# Patient Record
Sex: Male | Born: 1935 | Race: White | Hispanic: No | Marital: Married | Smoking: Former smoker
Health system: Southern US, Community
[De-identification: ages and names within clinical notes are randomized; demographics above are authoritative.]

## PROBLEM LIST (undated history)

## (undated) DIAGNOSIS — D649 Anemia, unspecified: Secondary | ICD-10-CM

## (undated) DIAGNOSIS — I255 Ischemic cardiomyopathy: Secondary | ICD-10-CM

## (undated) DIAGNOSIS — E119 Type 2 diabetes mellitus without complications: Secondary | ICD-10-CM

## (undated) DIAGNOSIS — M1990 Unspecified osteoarthritis, unspecified site: Secondary | ICD-10-CM

## (undated) DIAGNOSIS — Z8701 Personal history of pneumonia (recurrent): Secondary | ICD-10-CM

## (undated) DIAGNOSIS — I639 Cerebral infarction, unspecified: Secondary | ICD-10-CM

## (undated) DIAGNOSIS — R001 Bradycardia, unspecified: Secondary | ICD-10-CM

## (undated) DIAGNOSIS — Z950 Presence of cardiac pacemaker: Secondary | ICD-10-CM

## (undated) DIAGNOSIS — K922 Gastrointestinal hemorrhage, unspecified: Secondary | ICD-10-CM

## (undated) DIAGNOSIS — I482 Chronic atrial fibrillation, unspecified: Secondary | ICD-10-CM

## (undated) DIAGNOSIS — E782 Mixed hyperlipidemia: Secondary | ICD-10-CM

## (undated) DIAGNOSIS — Z9289 Personal history of other medical treatment: Secondary | ICD-10-CM

## (undated) DIAGNOSIS — N183 Chronic kidney disease, stage 3 unspecified: Secondary | ICD-10-CM

## (undated) DIAGNOSIS — I5022 Chronic systolic (congestive) heart failure: Secondary | ICD-10-CM

## (undated) DIAGNOSIS — I4510 Unspecified right bundle-branch block: Secondary | ICD-10-CM

## (undated) DIAGNOSIS — I10 Essential (primary) hypertension: Secondary | ICD-10-CM

## (undated) DIAGNOSIS — I2510 Atherosclerotic heart disease of native coronary artery without angina pectoris: Secondary | ICD-10-CM

## (undated) HISTORY — DX: Chronic kidney disease, stage 3 (moderate): N18.3

## (undated) HISTORY — DX: Chronic atrial fibrillation, unspecified: I48.20

## (undated) HISTORY — DX: Mixed hyperlipidemia: E78.2

## (undated) HISTORY — DX: Essential (primary) hypertension: I10

## (undated) HISTORY — DX: Cerebral infarction, unspecified: I63.9

## (undated) HISTORY — DX: Chronic kidney disease, stage 3 unspecified: N18.30

## (undated) HISTORY — DX: Chronic systolic (congestive) heart failure: I50.22

## (undated) HISTORY — DX: Bradycardia, unspecified: R00.1

## (undated) HISTORY — DX: Gastrointestinal hemorrhage, unspecified: K92.2

## (undated) HISTORY — DX: Atherosclerotic heart disease of native coronary artery without angina pectoris: I25.10

## (undated) HISTORY — DX: Unspecified right bundle-branch block: I45.10

---
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
## 1969-04-15 DIAGNOSIS — Z8701 Personal history of pneumonia (recurrent): Secondary | ICD-10-CM

## 1969-04-15 HISTORY — DX: Personal history of pneumonia (recurrent): Z87.01

## 1999-04-16 HISTORY — PX: LUMBAR DISC SURGERY: SHX700

## 2005-03-03 ENCOUNTER — Encounter: Admission: RE | Admit: 2005-03-03 | Discharge: 2005-04-21 | Payer: Self-pay | Admitting: Family Medicine

## 2005-05-24 ENCOUNTER — Ambulatory Visit: Payer: Self-pay | Admitting: Cardiology

## 2005-05-26 ENCOUNTER — Ambulatory Visit: Payer: Self-pay | Admitting: Cardiology

## 2005-05-30 ENCOUNTER — Inpatient Hospital Stay (HOSPITAL_COMMUNITY): Admission: RE | Admit: 2005-05-30 | Discharge: 2005-05-31 | Payer: Self-pay | Admitting: Neurosurgery

## 2005-06-22 ENCOUNTER — Ambulatory Visit: Payer: Self-pay | Admitting: Cardiology

## 2005-07-26 ENCOUNTER — Ambulatory Visit: Admission: RE | Admit: 2005-07-26 | Discharge: 2005-07-26 | Payer: Self-pay | Admitting: Neurosurgery

## 2005-11-29 ENCOUNTER — Ambulatory Visit: Payer: Self-pay | Admitting: Cardiology

## 2005-12-02 ENCOUNTER — Ambulatory Visit: Payer: Self-pay | Admitting: Cardiology

## 2005-12-05 ENCOUNTER — Ambulatory Visit: Payer: Self-pay | Admitting: Cardiology

## 2005-12-09 ENCOUNTER — Ambulatory Visit: Payer: Self-pay | Admitting: Cardiology

## 2005-12-16 ENCOUNTER — Ambulatory Visit: Payer: Self-pay | Admitting: Cardiology

## 2005-12-27 ENCOUNTER — Ambulatory Visit: Payer: Self-pay | Admitting: Cardiology

## 2005-12-30 ENCOUNTER — Ambulatory Visit: Payer: Self-pay | Admitting: Cardiology

## 2006-01-03 ENCOUNTER — Ambulatory Visit: Payer: Self-pay | Admitting: Cardiology

## 2006-01-10 ENCOUNTER — Ambulatory Visit: Payer: Self-pay | Admitting: Cardiology

## 2006-01-11 ENCOUNTER — Ambulatory Visit: Payer: Self-pay | Admitting: Cardiology

## 2006-01-13 ENCOUNTER — Ambulatory Visit: Payer: Self-pay | Admitting: Cardiology

## 2006-01-24 ENCOUNTER — Ambulatory Visit: Payer: Self-pay | Admitting: Cardiology

## 2006-01-27 ENCOUNTER — Inpatient Hospital Stay (HOSPITAL_COMMUNITY): Admission: AD | Admit: 2006-01-27 | Discharge: 2006-02-05 | Payer: Self-pay | Admitting: Cardiology

## 2006-01-27 ENCOUNTER — Ambulatory Visit: Payer: Self-pay | Admitting: Cardiology

## 2006-02-04 ENCOUNTER — Ambulatory Visit: Payer: Self-pay | Admitting: Internal Medicine

## 2006-02-06 ENCOUNTER — Ambulatory Visit: Payer: Self-pay | Admitting: Cardiology

## 2006-02-08 ENCOUNTER — Ambulatory Visit: Payer: Self-pay | Admitting: Cardiology

## 2006-02-16 ENCOUNTER — Ambulatory Visit: Payer: Self-pay | Admitting: Cardiology

## 2006-02-22 ENCOUNTER — Ambulatory Visit: Payer: Self-pay | Admitting: Cardiology

## 2006-03-08 ENCOUNTER — Ambulatory Visit: Payer: Self-pay | Admitting: Cardiology

## 2006-04-05 ENCOUNTER — Ambulatory Visit: Payer: Self-pay | Admitting: Cardiology

## 2006-04-12 ENCOUNTER — Ambulatory Visit: Payer: Self-pay | Admitting: Cardiology

## 2006-04-19 ENCOUNTER — Ambulatory Visit: Payer: Self-pay | Admitting: Cardiology

## 2006-04-26 ENCOUNTER — Ambulatory Visit: Payer: Self-pay | Admitting: Cardiology

## 2006-05-03 ENCOUNTER — Ambulatory Visit: Payer: Self-pay | Admitting: Cardiology

## 2006-05-17 ENCOUNTER — Ambulatory Visit: Payer: Self-pay | Admitting: Cardiology

## 2006-06-21 ENCOUNTER — Ambulatory Visit: Payer: Self-pay | Admitting: Cardiology

## 2006-07-19 ENCOUNTER — Ambulatory Visit: Payer: Self-pay | Admitting: Cardiology

## 2006-08-17 ENCOUNTER — Ambulatory Visit: Payer: Self-pay | Admitting: Cardiology

## 2006-09-19 ENCOUNTER — Ambulatory Visit: Payer: Self-pay | Admitting: Cardiology

## 2006-10-18 ENCOUNTER — Ambulatory Visit: Payer: Self-pay | Admitting: Cardiology

## 2006-11-01 ENCOUNTER — Ambulatory Visit: Payer: Self-pay | Admitting: Cardiology

## 2006-11-30 ENCOUNTER — Ambulatory Visit: Payer: Self-pay | Admitting: Cardiology

## 2006-12-28 ENCOUNTER — Ambulatory Visit: Payer: Self-pay | Admitting: Cardiology

## 2007-01-29 ENCOUNTER — Ambulatory Visit: Payer: Self-pay | Admitting: Physician Assistant

## 2007-02-26 ENCOUNTER — Ambulatory Visit: Payer: Self-pay | Admitting: Physician Assistant

## 2007-03-28 ENCOUNTER — Ambulatory Visit: Payer: Self-pay | Admitting: Cardiology

## 2007-03-29 IMAGING — US US RENAL
1 series · 14 of 21 positions shown · non-contrast
Comparison: none

CLINICAL DATA: Acute renal failure.  Hypertension and diabetes.  
RENAL/URINARY TRACT ULTRASOUND:
TECHNIQUE: Complete ultrasound examination of the urinary tract was performed including evaluation of the kidneys, renal collecting systems, and urinary bladder.

[Series 1: unknown · 0.32mm/px · 14 of 21 slices shown]
[im 1/21]
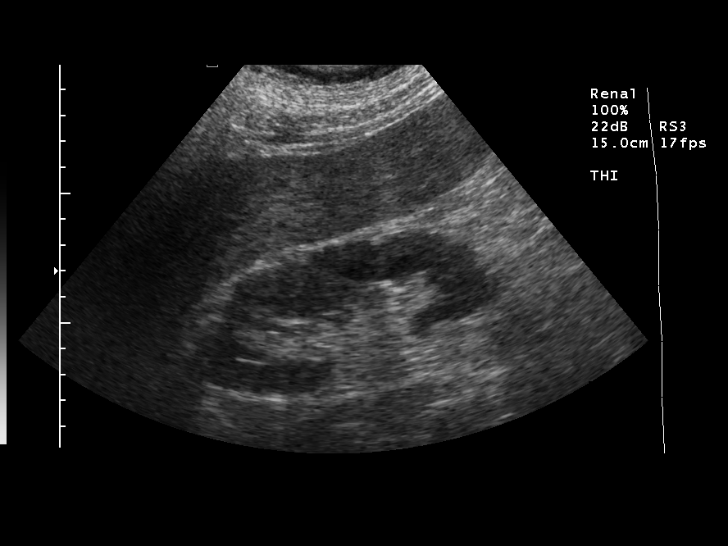
[im 3/21]
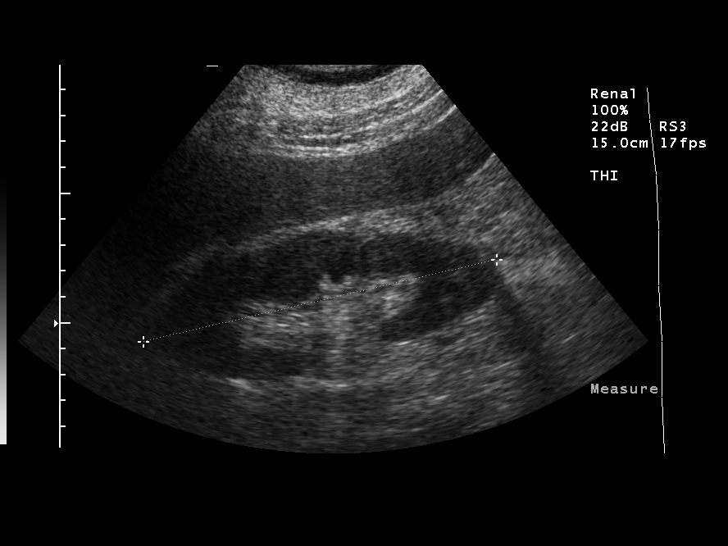
[im 4/21]
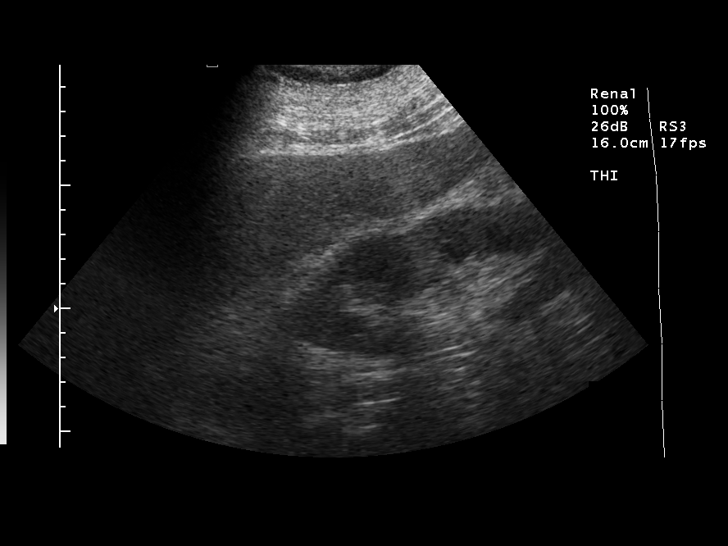
[im 6/21]
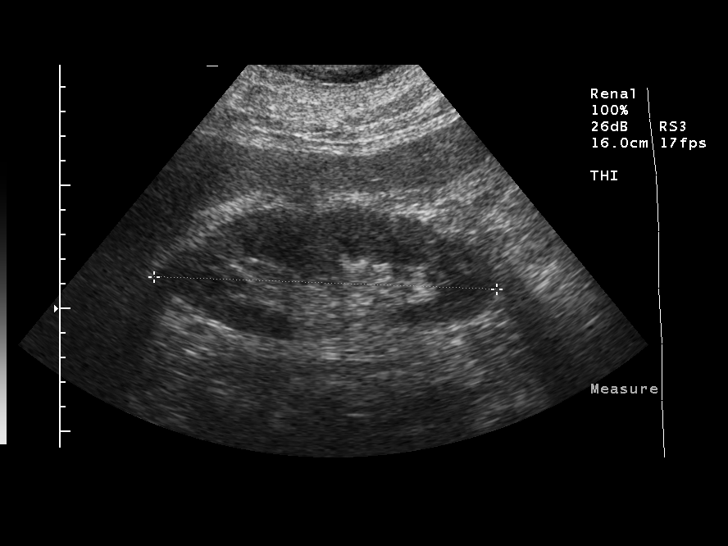
[im 7/21]
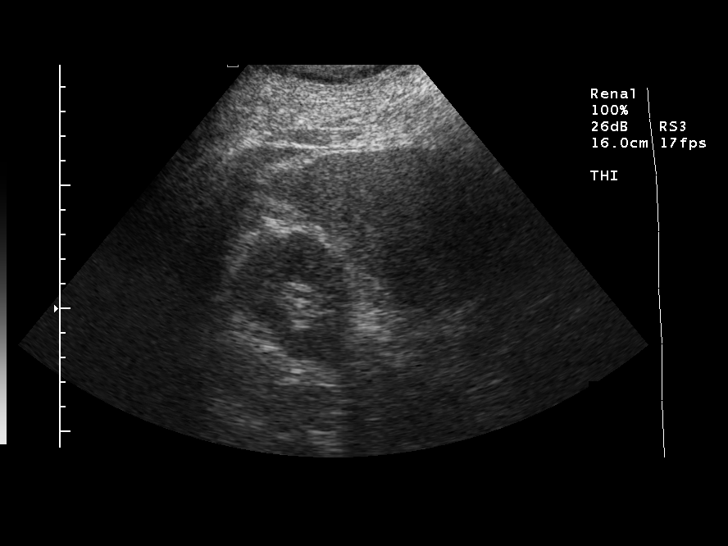
[im 9/21]
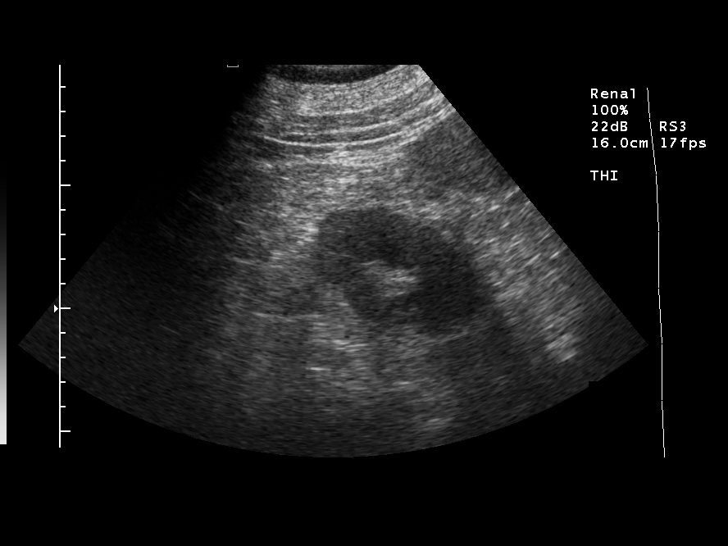
[im 10/21]
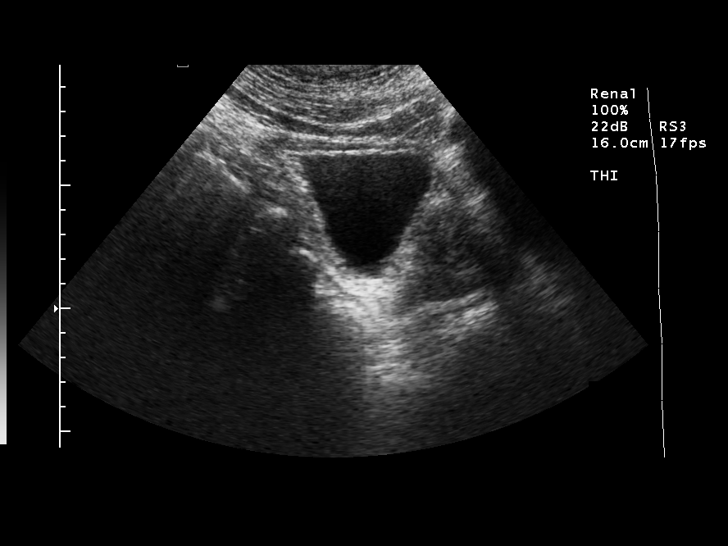
[im 12/21]
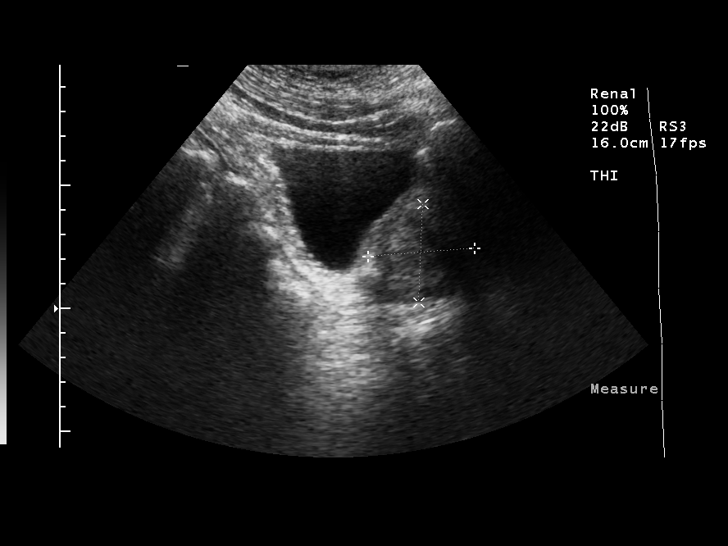
[im 13/21]
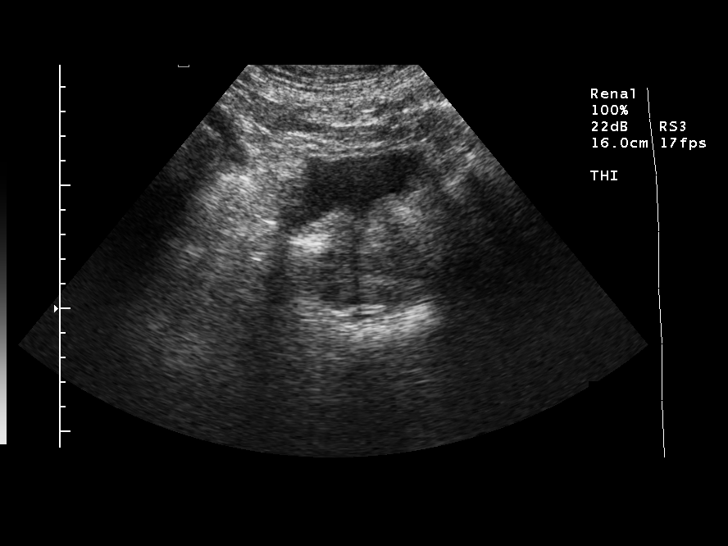
[im 15/21]
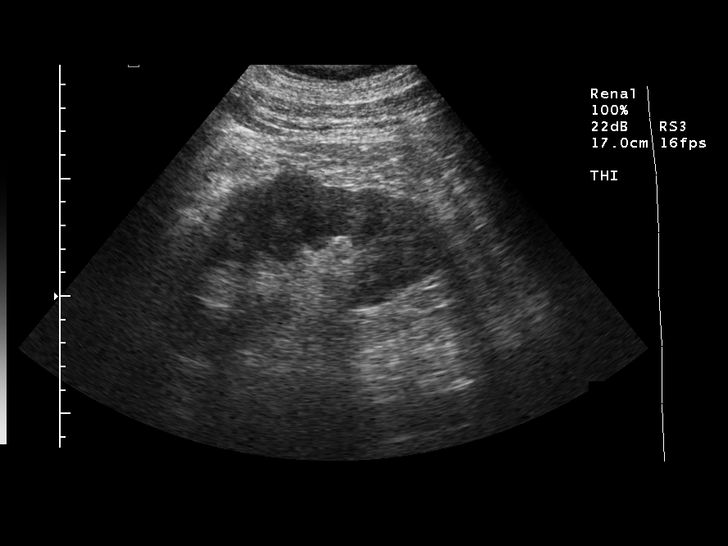
[im 16/21]
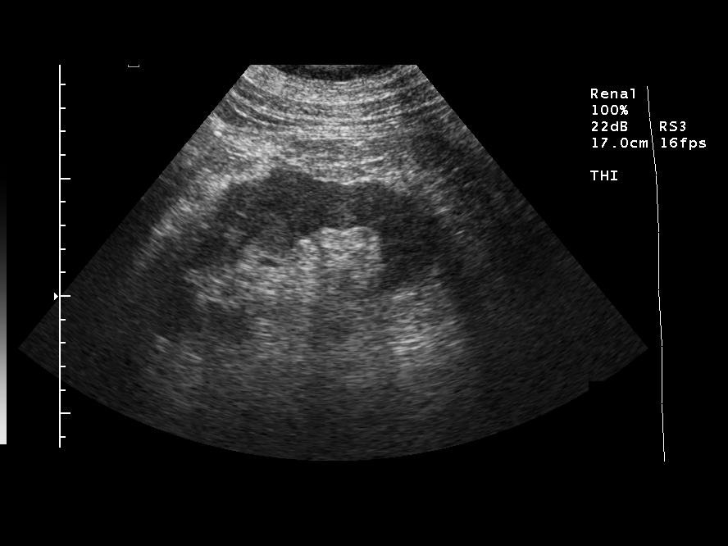
[im 18/21]
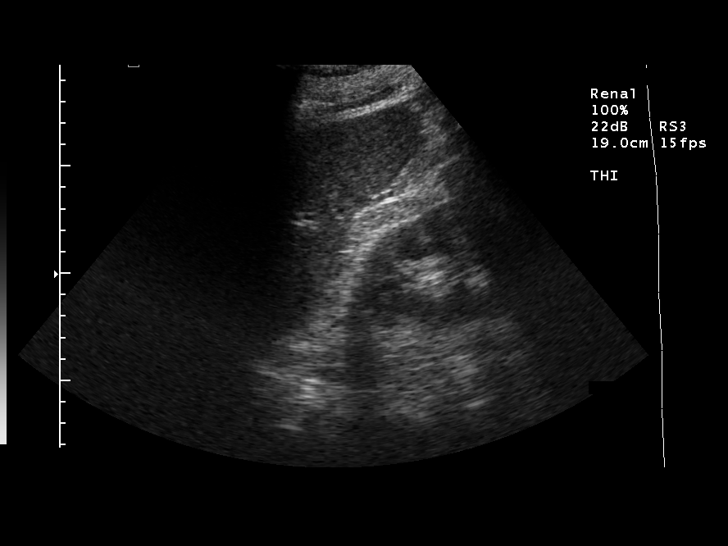
[im 19/21]
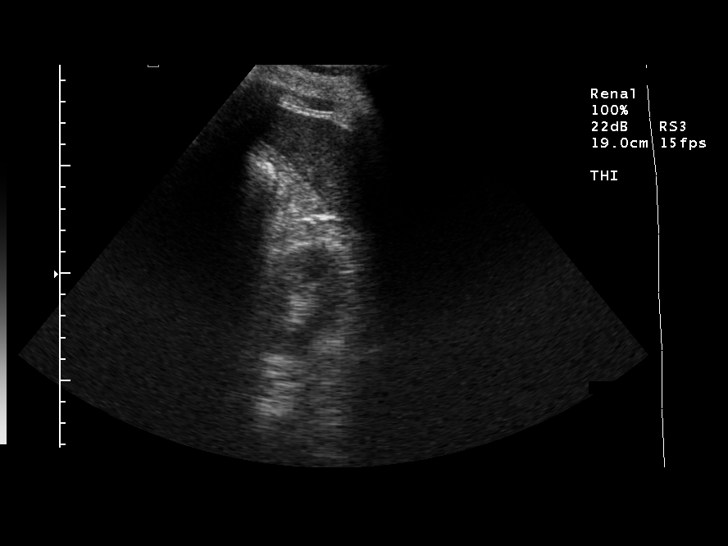
[im 21/21]
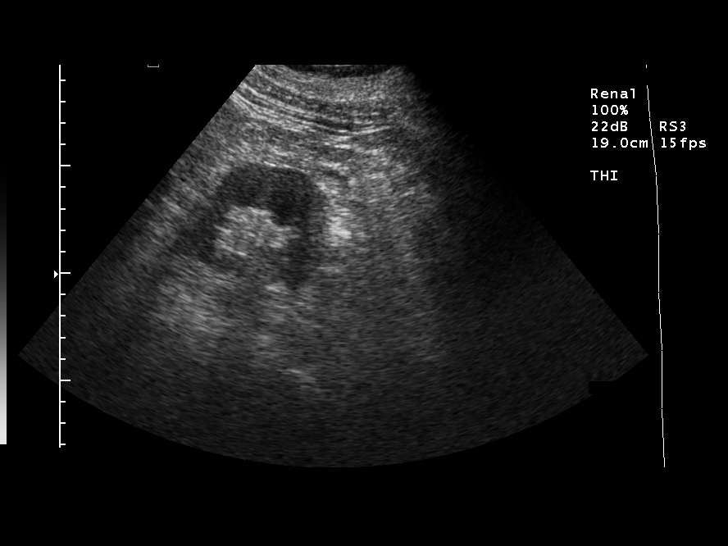

[14 of 21 positions shown; findings below may reference images not displayed]

FINDINGS: The right and left kidneys measure 14.0 cm and 13.3 cm in length, respectively.  There is no hydronephrosis.  No renal mass or abnormality of parenchymal echogenicity/echotexture.  Prominent prostate gland measuring 4.0 x 4.3 x 6.1 cm.
IMPRESSION: No hydronephrosis. 
Enlarged prostate gland.

## 2007-03-29 IMAGING — CR DG CHEST 2V
2 series · 2 of 2 positions shown · non-contrast
Comparison: 01/27/06.

CLINICAL DATA: Atrial fibrillation, evaluate for pulmonary edema. 
 CHEST - 2 VIEW:

[w chest pa]
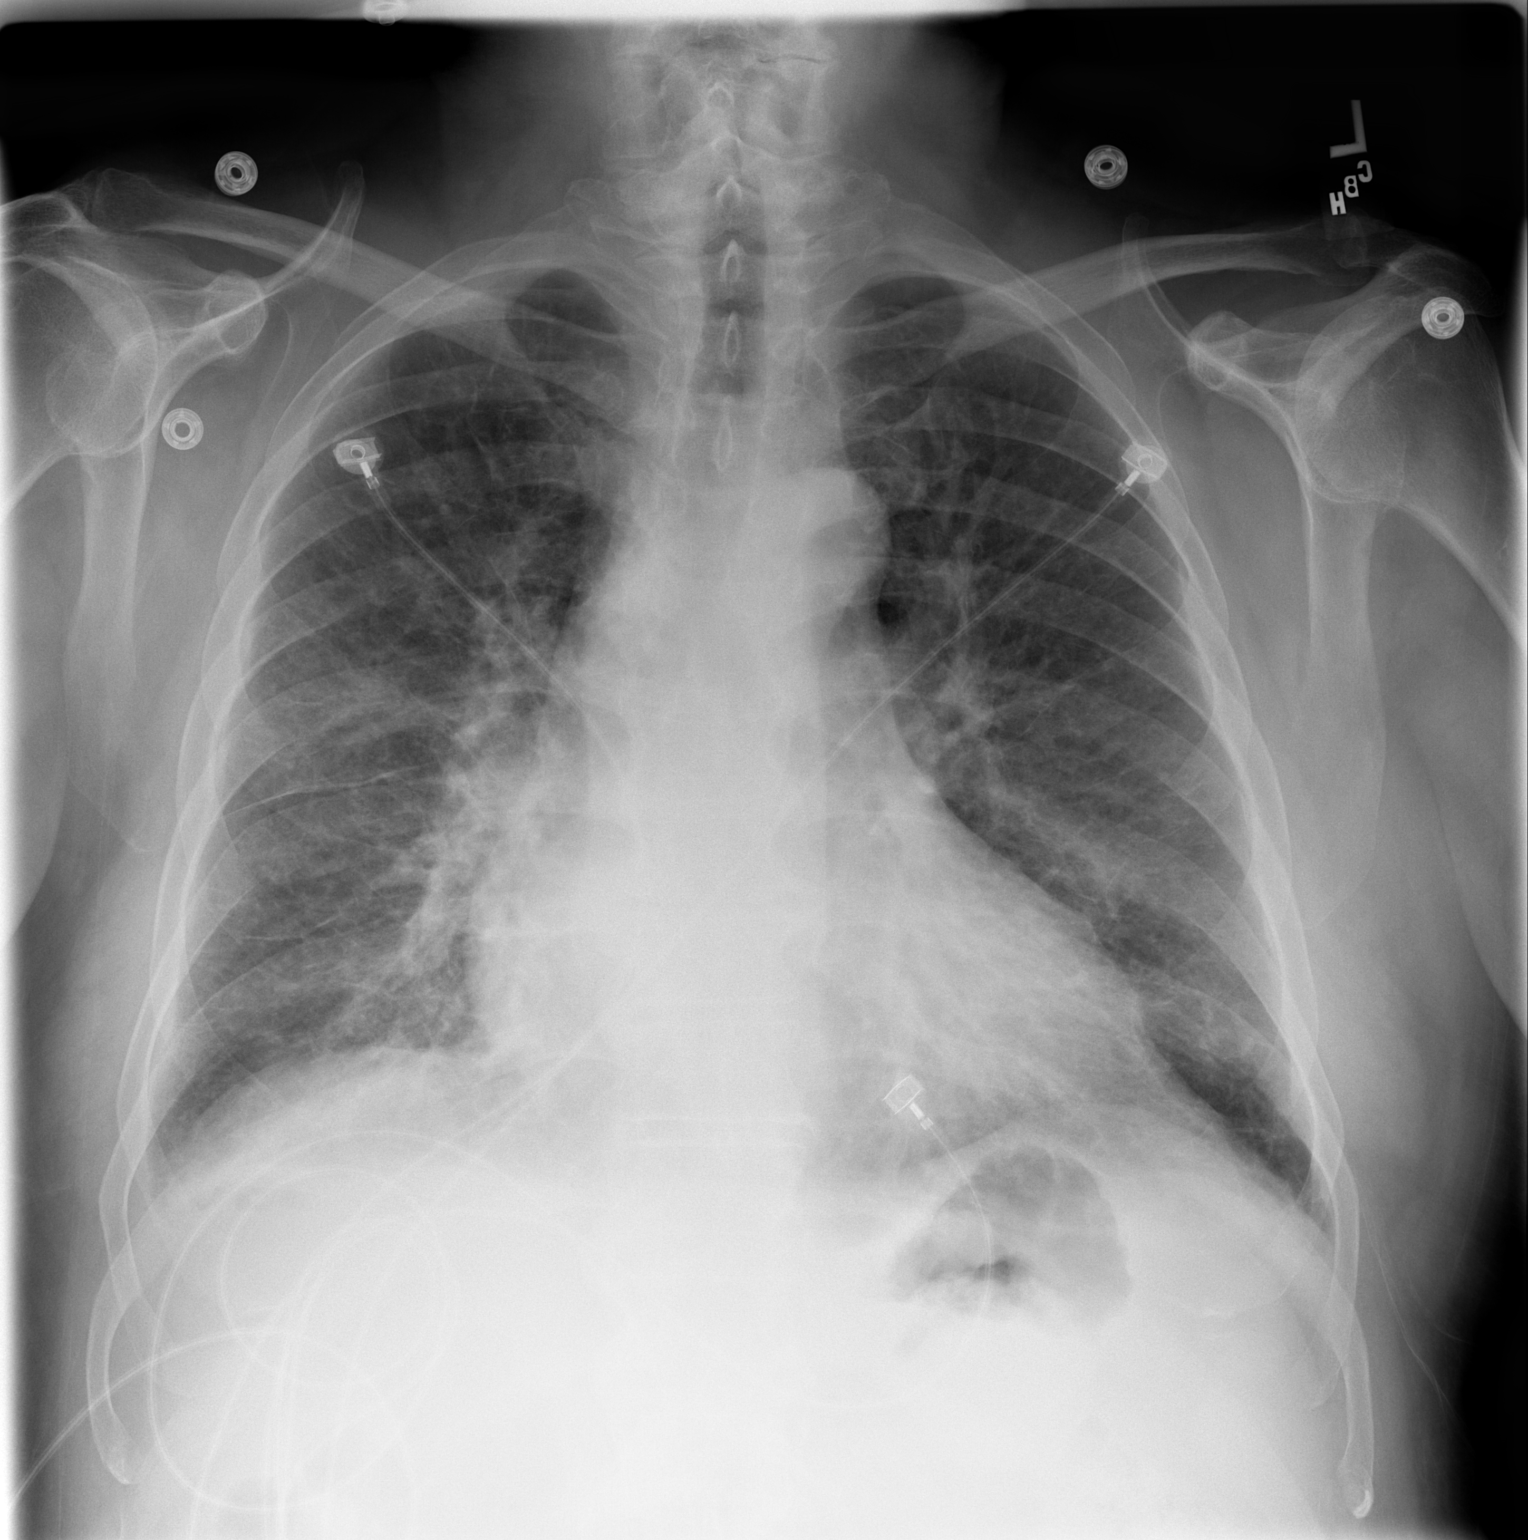

[w chest lat]
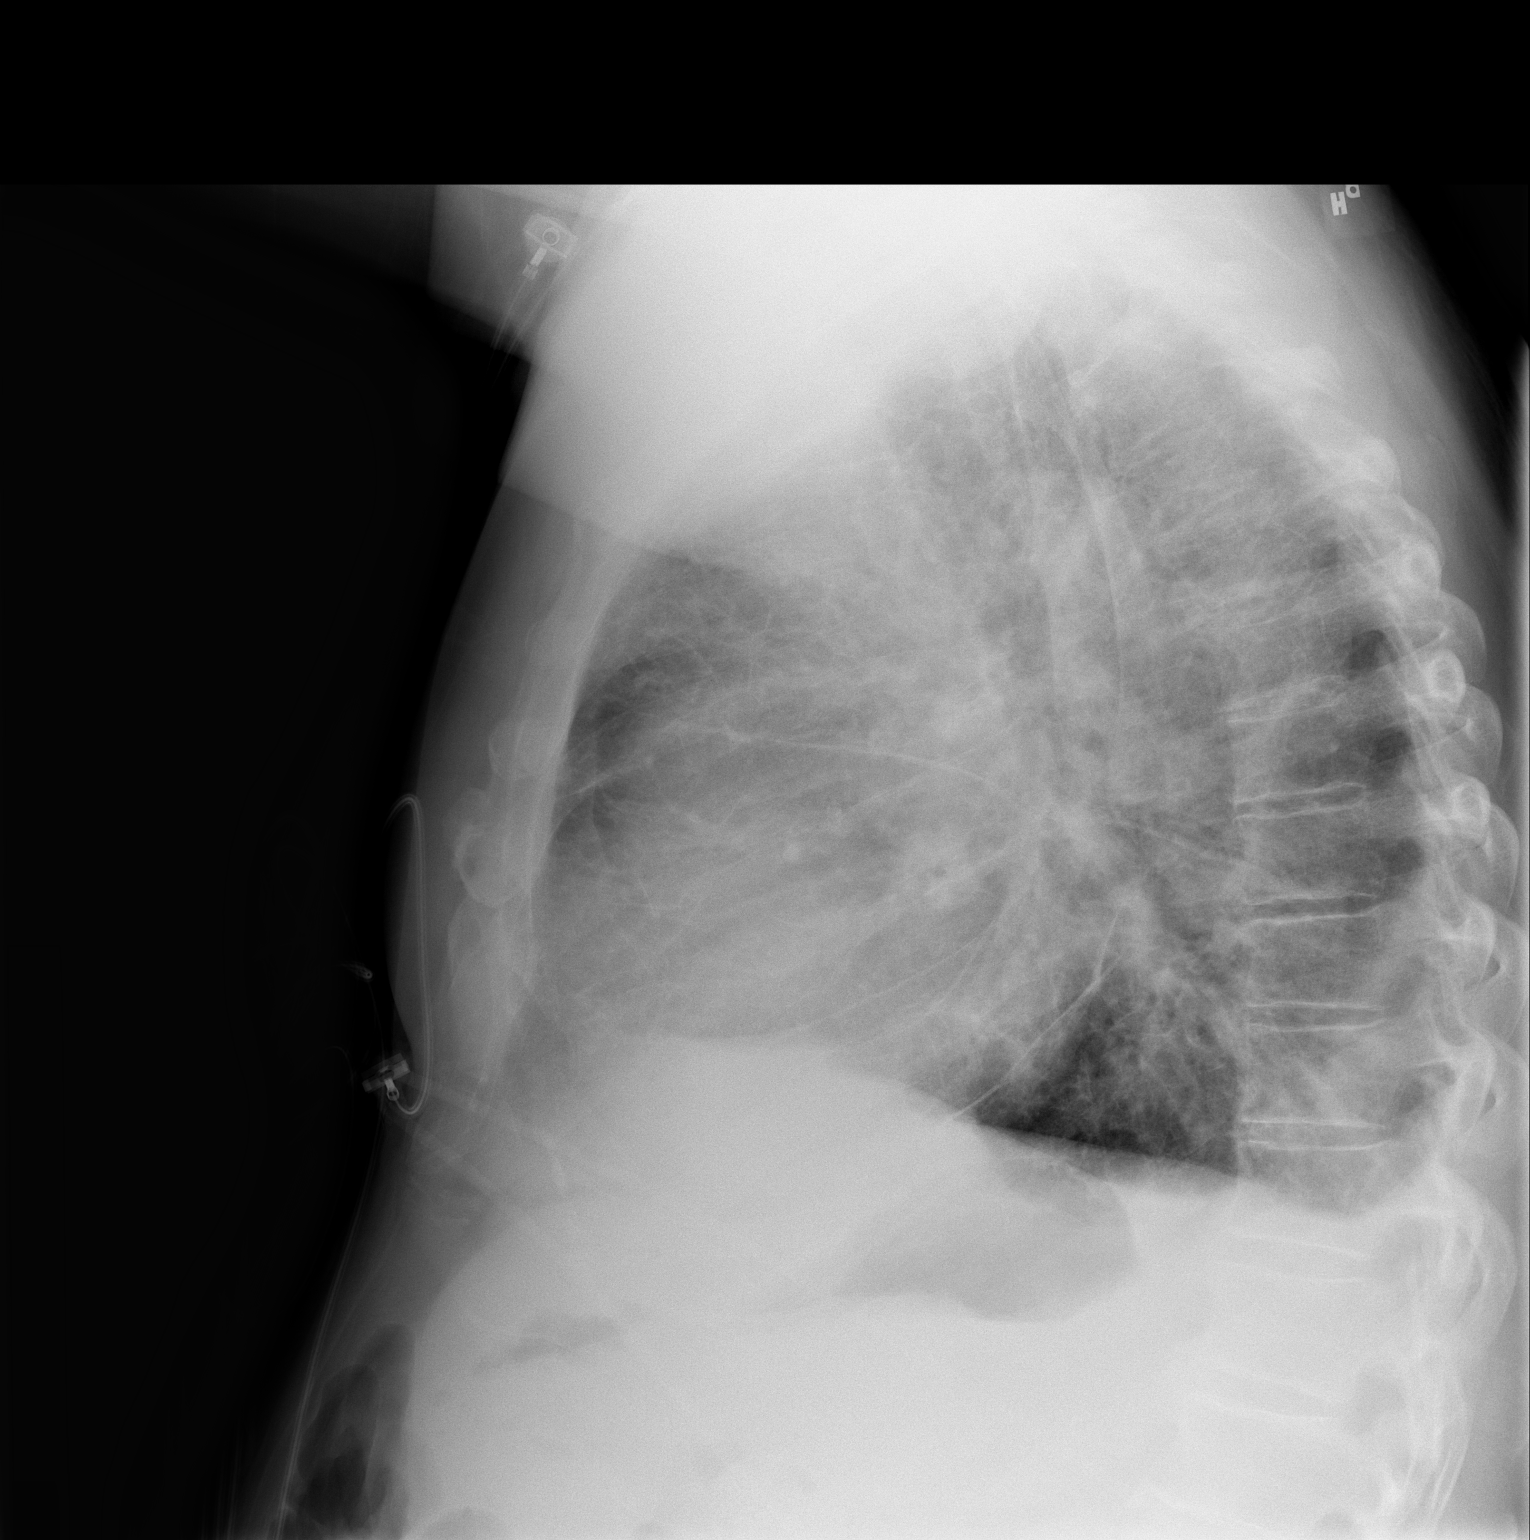

[2 of 2 positions shown; findings below may reference images not displayed]

FINDINGS: There is cardiomegaly.  Bilateral pleural effusions and moderate interstitial edema are noted consistent with fluid overload.  There are patchy opacities within the right mid lung in the left base which may represent atelectasis and/or infection.
IMPRESSION: 1.  Moderate fluid overload with effusions and pulmonary edema.  
 2.  Bilateral patchy opacities consistent with atelectasis and/or infection.

## 2007-04-30 ENCOUNTER — Ambulatory Visit: Payer: Self-pay | Admitting: Cardiology

## 2007-05-14 ENCOUNTER — Encounter: Payer: Self-pay | Admitting: Physician Assistant

## 2007-05-14 ENCOUNTER — Ambulatory Visit: Payer: Self-pay | Admitting: Cardiology

## 2007-05-28 ENCOUNTER — Ambulatory Visit: Payer: Self-pay | Admitting: Cardiology

## 2007-06-28 ENCOUNTER — Ambulatory Visit: Payer: Self-pay | Admitting: Cardiology

## 2007-08-02 ENCOUNTER — Ambulatory Visit: Payer: Self-pay | Admitting: Cardiology

## 2007-11-05 ENCOUNTER — Ambulatory Visit: Payer: Self-pay | Admitting: Cardiology

## 2007-12-24 ENCOUNTER — Encounter: Payer: Self-pay | Admitting: Cardiology

## 2009-04-17 DIAGNOSIS — E785 Hyperlipidemia, unspecified: Secondary | ICD-10-CM | POA: Insufficient documentation

## 2009-04-17 DIAGNOSIS — I4510 Unspecified right bundle-branch block: Secondary | ICD-10-CM

## 2009-04-17 DIAGNOSIS — I5042 Chronic combined systolic (congestive) and diastolic (congestive) heart failure: Secondary | ICD-10-CM | POA: Insufficient documentation

## 2009-04-17 DIAGNOSIS — I2510 Atherosclerotic heart disease of native coronary artery without angina pectoris: Secondary | ICD-10-CM

## 2009-04-17 DIAGNOSIS — I482 Chronic atrial fibrillation, unspecified: Secondary | ICD-10-CM

## 2009-04-17 DIAGNOSIS — I10 Essential (primary) hypertension: Secondary | ICD-10-CM

## 2010-03-02 ENCOUNTER — Telehealth (INDEPENDENT_AMBULATORY_CARE_PROVIDER_SITE_OTHER): Payer: Self-pay | Admitting: *Deleted

## 2010-03-03 ENCOUNTER — Ambulatory Visit: Payer: Self-pay | Admitting: Cardiology

## 2010-03-05 ENCOUNTER — Encounter: Payer: Self-pay | Admitting: Physician Assistant

## 2010-03-09 ENCOUNTER — Encounter (INDEPENDENT_AMBULATORY_CARE_PROVIDER_SITE_OTHER): Payer: Self-pay | Admitting: *Deleted

## 2010-03-09 ENCOUNTER — Ambulatory Visit: Payer: Self-pay | Admitting: Cardiology

## 2010-03-09 DIAGNOSIS — N189 Chronic kidney disease, unspecified: Secondary | ICD-10-CM | POA: Insufficient documentation

## 2010-03-09 DIAGNOSIS — I4892 Unspecified atrial flutter: Secondary | ICD-10-CM

## 2010-03-17 ENCOUNTER — Ambulatory Visit: Payer: Self-pay | Admitting: Cardiology

## 2010-03-26 ENCOUNTER — Encounter: Payer: Self-pay | Admitting: Cardiology

## 2010-03-26 DIAGNOSIS — I2510 Atherosclerotic heart disease of native coronary artery without angina pectoris: Secondary | ICD-10-CM | POA: Insufficient documentation

## 2010-03-31 ENCOUNTER — Ambulatory Visit: Payer: Self-pay | Admitting: Cardiology

## 2010-04-05 ENCOUNTER — Telehealth (INDEPENDENT_AMBULATORY_CARE_PROVIDER_SITE_OTHER): Payer: Self-pay | Admitting: *Deleted

## 2010-04-08 ENCOUNTER — Ambulatory Visit: Payer: Self-pay | Admitting: Cardiology

## 2010-04-08 ENCOUNTER — Telehealth (INDEPENDENT_AMBULATORY_CARE_PROVIDER_SITE_OTHER): Payer: Self-pay | Admitting: *Deleted

## 2010-04-13 ENCOUNTER — Encounter: Payer: Self-pay | Admitting: Cardiology

## 2010-04-28 ENCOUNTER — Telehealth (INDEPENDENT_AMBULATORY_CARE_PROVIDER_SITE_OTHER): Payer: Self-pay | Admitting: *Deleted

## 2010-05-17 ENCOUNTER — Ambulatory Visit: Payer: Self-pay | Admitting: Cardiology

## 2010-05-17 DIAGNOSIS — G4730 Sleep apnea, unspecified: Secondary | ICD-10-CM | POA: Insufficient documentation

## 2010-05-20 ENCOUNTER — Encounter: Payer: Self-pay | Admitting: Cardiology

## 2010-05-24 ENCOUNTER — Encounter: Payer: Self-pay | Admitting: Cardiology

## 2010-06-03 ENCOUNTER — Telehealth (INDEPENDENT_AMBULATORY_CARE_PROVIDER_SITE_OTHER): Payer: Self-pay | Admitting: *Deleted

## 2010-06-16 ENCOUNTER — Encounter: Payer: Self-pay | Admitting: Cardiology

## 2010-06-25 ENCOUNTER — Encounter: Payer: Self-pay | Admitting: Cardiology

## 2010-07-06 ENCOUNTER — Encounter: Payer: Self-pay | Admitting: Cardiology

## 2010-07-15 DIAGNOSIS — K922 Gastrointestinal hemorrhage, unspecified: Secondary | ICD-10-CM | POA: Insufficient documentation

## 2010-07-28 ENCOUNTER — Telehealth (INDEPENDENT_AMBULATORY_CARE_PROVIDER_SITE_OTHER): Payer: Self-pay | Admitting: *Deleted

## 2010-07-30 ENCOUNTER — Encounter: Payer: Self-pay | Admitting: Cardiology

## 2010-08-02 ENCOUNTER — Encounter: Payer: Self-pay | Admitting: Cardiology

## 2010-08-09 ENCOUNTER — Encounter: Payer: Self-pay | Admitting: Cardiology

## 2010-08-16 ENCOUNTER — Encounter: Payer: Self-pay | Admitting: Cardiology

## 2010-08-19 ENCOUNTER — Encounter: Payer: Self-pay | Admitting: Cardiology

## 2010-09-09 ENCOUNTER — Ambulatory Visit
Admission: RE | Admit: 2010-09-09 | Discharge: 2010-09-09 | Payer: Self-pay | Source: Home / Self Care | Attending: Cardiology | Admitting: Cardiology

## 2010-09-14 NOTE — Progress Notes (Signed)
Summary: ? decrease Amiodarone dosage   Medications Added PRADAXA 75 MG CAPS (DABIGATRAN ETEXILATE MESYLATE) Take 1 tablet by mouth two times a day       Phone Note Call from Patient Call back at [phone] - cell   Summary of Call: Feeling better off of the Amiodarone, but still feeling a little weak.  Questions decreasing dosage on Pradaxa to 75mg  two times a day.  States you two discussed during last office visit.   Initial call taken by: Hoover Brunette, LPN,  June 03, 2010 4:08 PM  Follow-up for Phone Call        Decrease pradaxa to 75 mg by mouth two times a day and obtain PTT in 5 days to see if still therapeutic effect at lower dose.  Follow-up by: Lewayne Bunting, MD, [facility],  June 04, 2010 6:13 AM  Additional Follow-up for Phone Call Additional follow up Details #1::        Patient notified.   RX sent to [facility].  Will fax order for labs to [facility]. Hoover Brunette, LPN  June 04, 2010 4:43 PM     New/Updated Medications: PRADAXA 75 MG CAPS (DABIGATRAN ETEXILATE MESYLATE) Take 1 tablet by mouth two times a day Prescriptions: PRADAXA 75 MG CAPS (DABIGATRAN ETEXILATE MESYLATE) Take 1 tablet by mouth two times a day  #60 x 6   Entered by:   Hoover Brunette, LPN   Authorized by:   Lewayne Bunting, MD, [facility]   Signed by:   Hoover Brunette, LPN on 66/44/0347   Method used:   Electronically to        [facility]  [address] (retail)       [address]       [place], [place]  [address]       Ph: [phone]       Fax: [phone]   RxID:   [id number]

## 2010-09-14 NOTE — Progress Notes (Signed)
Summary: med question & lab results   Phone Note Call from Patient Call back at Home Phone [phone] Call back at [phone]   Caller: [name] [name] Reason for Call: Talk to Nurse, Lab or Test Results Details for Reason: MEDS Summary of Call: Patient called in reference to his meds.  Has a question whether something needs to be filled or not and only has 7 days left of meds.  Gets his rx by mail.  Also patient is waiting on test results... Initial call taken by: Claudette Laws,  April 28, 2010 10:05 AM  Follow-up for Phone Call        Notified pt of all test results.  States he still is not feeling good.  No energy, feels weakness in legs at knee level.  No dizziness or SOB to mention.  States he did feel a little better after the recent decrease of Amiodarone.  Also, states that PMD did d/c this med when he was last on a while back due to not being able to tolerate.  He also questions whether or not the Pradaxa could be causing a problem.  Follow-up by: Hoover Brunette, LPN,  April 28, 2010 3:28 PM  Additional Follow-up for Phone Call Additional follow up Details #1::        stop amiodarone with follow-up visits in the next couple of weeks   [name]  May 03, 2010 12:36 PM Patient called again about Pradaxa & Aimodarone has three days of meds and is going out of town Thursday would like a answer today Additional Follow-up by: Lewayne Bunting, MD, [facility],  May 02, 2010 3:42 PM    Additional Follow-up for Phone Call Additional follow up Details #2::    Patient notified.   OV scheduled for 10/3 at 8:30.  Patient verbalized understanding.  Hoover Brunette, LPN  May 03, 2010 1:56 PM   Prescriptions: PRADAXA 150 MG CAPS (DABIGATRAN ETEXILATE MESYLATE) Take 1 tablet by mouth two times a day  #60 x 1   Entered by:   Hoover Brunette, LPN   Authorized by:   Lewayne Bunting, MD, [facility]   Signed by:   Hoover Brunette, LPN on 29/56/2130   Method used:   Electronically to        [facility]  [address] (retail)       [address]       [place], [place]  [address]       Ph: [phone]       Fax: [phone]   RxID:   [id number]

## 2010-09-14 NOTE — Miscellaneous (Signed)
Summary: Orders Update - echo  Clinical Lists Changes  Problems: Added new problem of LEFT VENTRICULAR FUNCTION, DECREASED (ICD-429.2) Orders: Added new Referral order of 2-D Echocardiogram (2D Echo) - Signed

## 2010-09-14 NOTE — Letter (Signed)
Summary: External Correspondence/ FAXED [facility] REFERR[facility]  External Correspondence/ FAXED [facility] RE[facility]ERR[facility]   Imported By: [name] 06/01/2010 15:41:39  _____________________________________________________________________  External Attachment:    Type:   Image     Comment:   External Document

## 2010-09-14 NOTE — Progress Notes (Signed)
Summary: HEART RATE 120 PLUS       Phone Note Call from Patient Call back at [phone]   Reason for Call: Talk to Nurse Details for Reason: heart racing Summary of Call: PATIENT'S WIFE CALLED VERY CONCERNED WITH IS HEART RATE 120 PLUS FOR LAST SEVERAL DAYS. SHE FEELS HE  NEEDS HIS HEART SHOCKED AGAIN AND WOULD LIKE TO SEE DEGENT ASAP IN REFERENCE TO THIS.    SHE ALSO LEFT A VOICE MAIL THIS MORNING TO GIVE MORE DETAILS...  Initial call taken by: Claudette Laws,  March 02, 2010 8:14 AM  Follow-up for Phone Call        Left message to return call.  Hoover Brunette, LPN  March 02, 2010 11:43 AM   Did check [facility] and patient was admitted to [facility].  [place], LPN  March 02, 2010 11:53 AM     Additional Follow-up for Phone Call Additional follow up Details #1::        OK thanks Additional Follow-up by: Lewayne Bunting, MD, [facility],  March 02, 2010 1:24 PM

## 2010-09-14 NOTE — Letter (Signed)
Summary: External Correspondence/ DR. Ninetta Lights  External Correspondence/ DR. Ninetta Lights   Imported By: [name] 06/25/2010 10:54:59  _____________________________________________________________________  External Attachment:    Type:   Image     Comment:   External Document

## 2010-09-14 NOTE — Progress Notes (Signed)
Summary: feeling weak and exhausted    Phone Note Other Incoming Call back at [phone]   Caller: wife - [name]  Summary of Call: 10:04  Put on Aniodarone and Pradaxa about a month ago.  Dr. [name] reminded him that he had been on before and he could not take it.  Not sure if Pradaxa is too much for him.  No energy for anything  [name] - wife. Doesn't think his system has adjusted to it very well.  just decreased Amio to half, but still sleeping alot and no energy 12:11  [name] Larene Pickett  - needs med refilled today,  give another number  [phone] [name] [place] - ? cut back on Amiodarone , wants to know what to do before refilling scripts  Initial call taken by: Hoover Brunette, LPN,  April 05, 2010 3:36 PM  Follow-up for Phone Call        Wife states he feels exhausted and sleeping alot more than usual since starting both new meds.  Feels weak and not much stamina to do things.   Patient would like to know before the end of day if possible, since she is due to refill both meds today.  Doesn't want to buy if it is not necessary.  Hoover Brunette, LPN  April 05, 2010 3:44 PM   Additional Follow-up for Phone Call Additional follow up Details #1::        Needs to have 48 Holter monitor done ASAP. Come to office also for vitals/orthostatics and ECG.  Additional Follow-up by: Lewayne Bunting, MD, [facility],  April 06, 2010 6:27 PM    Additional Follow-up for Phone Call Additional follow up Details #2::    Patient notified.   Nurse visit scheduled for Thursday, 8/25 at 8:45.  Patient verbalized understanding.  Follow-up by: Hoover Brunette, LPN,  April 07, 2010 9:38 AM

## 2010-09-14 NOTE — Procedures (Signed)
Summary: Holter and Event/ [facility]  Holter and Event/ [facility]   Imported By: [name] 04/27/2010 14:17:36  _____________________________________________________________________  External Attachment:    Type:   Image     Comment:   External Document  Appended Document: Holter and Event/ [facility] stable rhythm.   Appended Document: Holter and Event/ [facility] Patient notified.

## 2010-09-14 NOTE — Assessment & Plan Note (Signed)
Summary: rov, d/c amiodarone 9/19  --agh  Medications Added METOPROLOL SUCCINATE 50 MG XR24H-TAB (METOPROLOL SUCCINATE) Take 1 tablet by mouth daily LASIX 20 MG TABS (FUROSEMIDE) Take 1 tablet by mouth once a day      Allergies Added:   Visit Type:  Follow-up Primary Provider:  [name]   History of Present Illness: the patient is a 77 year old male with a history of multivessel coronary artery disease, status post prior percutaneous coronary intervention, LV dysfunction with an ejection fraction of 45%. He was admitted recently with rapid atrial fibrillation and underwent TEE guided cardioversion. The patient was initially started on dabigatran and amiodarone. The patient however developed nausea and some tremors from amiodarone this was then decreased. He also complains of some abdominal fullness. He's been feeling increasingly weak. He reports shortness of breath on exertion and has noticed less energy. He has however developed significant lower extremity edema and abdominal swelling. He also has some symptoms consistent with reflux.  Preventive Screening-Counseling & Management  Alcohol-Tobacco     Smoking Status: quit     Year Quit: 1971   Current Medications (verified): 1)  Metoprolol Succinate 50 Mg Xr24h-Tab (Metoprolol Succinate) .... Take 1 Tablet By Mouth Daily 2)  Lantus 100 Unit/ml Soln (Insulin Glargine) .... Use As Directed 3)  Aspir-Low 81 Mg Tbec (Aspirin) .... Take 1 Tablet By Mouth Once A Day 4)  Lisinopril 20 Mg Tabs (Lisinopril) .... Take 1 Tablet By Mouth Once A Day 5)  Pradaxa 150 Mg Caps (Dabigatran Etexilate Mesylate) .... Take 1 Tablet By Mouth Two Times A Day 6)  Lasix 20 Mg Tabs (Furosemide) .... Take 1 Tablet By Mouth Once A Day  Allergies (verified): 1)  ! Pcn  Comments:  Nurse/Medical Assistant: The patient's medication  bottless and allergies were reviewed with the patient and were updated in the Medication and Allergy Lists.  Past  History:  Family History: Last updated: 04/17/2009 Family History of Diabetes:  Family History of Hypertension:   Social History: Last updated: 04/17/2009 Retired  Married  Tobacco Use - No.  Alcohol Use - no Regular Exercise - yes  Risk Factors: Exercise: yes (04/17/2009)  Risk Factors: Smoking Status: quit (05/17/2010)  Past Medical History: RBBB (ICD-426.4) ATRIAL FIBRILLATION (ICD-427.31) HYPERTENSION, UNSPECIFIED (ICD-401.9) HYPERLIPIDEMIA-MIXED (ICD-272.4) SYSTOLIC HEART FAILURE, CHRONIC (ICD-428.22) CAD, NATIVE VESSEL (ICD-414.01) Chronic renal insufficiency Echocardiogram 8/17 2011:ejection fraction 40-45%, apical septal and apical anterior apical lateral wall motion abnormality. Moderately decreased LV function. Myocardial perfusion study August 2011 large fixed apical and basal anterior/anteroseptal defect with akinesis consistent with prior infarct. No ischemia ejection fraction 36% Holter monitor heart rate 48-87 beats per minute with a mean of 60 beats per minute. August 2011 My card perfusion study Review of Systems       The patient complains of fatigue, chest pain, and shortness of breath.  The patient denies malaise, fever, weight gain/loss, vision loss, decreased hearing, hoarseness, palpitations, prolonged cough, wheezing, sleep apnea, coughing up blood, abdominal pain, blood in stool, nausea, vomiting, diarrhea, heartburn, incontinence, blood in urine, muscle weakness, joint pain, leg swelling, rash, skin lesions, headache, fainting, dizziness, depression, anxiety, enlarged lymph nodes, easy bruising or bleeding, and environmental allergies.         tremor.  Vital Signs:  Patient profile:   77 year old male Height:      70 inches Weight:      217 pounds Pulse rate:   57 / minute BP sitting:   149 / 71  (left arm) Cuff  size:   large  Vitals Entered By: Carlye Grippe (May 17, 2010 8:45 AM)   Physical Exam  Additional Exam:  General:  Well-developed, well-nourished in no distress head: Normocephalic and atraumatic eyes PERRLA/EOMI intact, conjunctiva and lids normal nose: No deformity or lesions mouth normal dentition, normal posterior pharynx neck: Supple, no JVD.  No masses, thyromegaly or abnormal cervical nodes lungs: Normal breath sounds bilaterally without wheezing.  Normal percussion heart: regular rate and rhythm with normal S1 and S2, no S3 or S4.  PMI is normal.  No pathological murmurs abdomen: Normal bowel sounds, abdomen is soft and nontender without masses, organomegaly or hernias noted.  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 He has evidence of volume overload and he will be started on Lasix 20 mg p.o. q. daily. We'll also obtain followup laboratory work  Problem # 2:  ATRIAL FLUTTER (ICD-427.32) patient has remained in normal sinus rhythm despite the fact that we have further decrease his amiodarone. We will check a TSH The following medications were removed from the medication list:    Amiodarone Hcl 400 Mg Tabs (Amiodarone hcl) .Marland Kitchen... Take 1 tablet by mouth once a day His updated medication list for this problem includes:    Metoprolol Succinate 50 Mg Xr24h-tab (Metoprolol succinate) .Marland Kitchen... Take 1 tablet by mouth daily    Aspir-low 81 Mg Tbec (Aspirin) .Marland Kitchen... Take 1 tablet by mouth once a day  Problem # 3:  CAD, NATIVE VESSEL (ICD-414.01) no definite chest pain suggestive of ischemia. Continue medical therapy His updated medication list for this problem  includes:    Metoprolol Succinate 50 Mg Xr24h-tab (Metoprolol succinate) .Marland Kitchen... Take 1 tablet by mouth daily    Aspir-low 81 Mg Tbec (Aspirin) .Marland Kitchen... Take 1 tablet by mouth once a day    Lisinopril 20 Mg Tabs (Lisinopril) .Marland Kitchen... Take 1 tablet by mouth once a day  Problem # 4:  UNSPECIFIED SLEEP APNEA (ICD-780.57) I suspect that the patient's symptoms could be related to sleep apnea. By nuclear imaging the right ventricle appear to be dilated. The patient will be referred for a sleep study.  Other Orders: EKG w/ Interpretation (93000) Future Orders: T-Basic Metabolic Panel [id number]) ... 05/24/2010 T-BNP  (B Natriuretic Peptide) [id number]) ... 05/24/2010 T-TSH ([id number]) ... 05/24/2010  Patient Instructions: 1)  Lasix 20mg  daily 2)  Labs: CBC, BMET, TSH - do in 7-10 days 3)  Referral to Dr. Ninetta Lights for sleep study 4)  Follow up in  3 months Prescriptions: LASIX 20 MG TABS (FUROSEMIDE) Take 1 tablet by mouth once a day  #30 x 6   Entered by:   Hoover Brunette, LPN   Authorized by:   Lewayne Bunting, MD, [facility]   Signed by:   Hoover Brunette, LPN on 24/40/1027   Method used:   Electronically to        [facility]  [address] (retail)       [address]       [place], [place]  [address]       Ph: [phone]  Fax: [phone]   RxID:   [id number]

## 2010-09-14 NOTE — Miscellaneous (Signed)
Summary: Orders Update - [place]  Clinical Lists Changes  Orders: Added new Referral order of Neurology Referral (Neuro) - Signed

## 2010-09-14 NOTE — Progress Notes (Signed)
Summary: nurse visit    Phone Note Other Incoming   Caller: NURSE VISTI FOR 48 HR HOLTER MONITOR Summary of Call: 48 hour holter monitor applied and instructions given.  EKG done also & scanned in.  Orthostatic blood pressure readings are listed below:  R ARM LYING:                   134/72     HR - 52 R ARM SITTING:                 137/64     HR - 61 R ARM STANDING:            146/71     HR - 59 STANDING AFTER 2 MIN:  132/70     HR - 53 STANDING AFTER 3 MIN:  144/67     HR - 56 Initial call taken by: Hoover Brunette, LPN,  April 08, 2010 10:17 AM  Follow-up for Phone Call        OK Follow-up by: Lewayne Bunting, MD, [facility],  April 08, 2010 1:07 PM  Additional Follow-up for Phone Call Additional follow up Details #1::        Patient notified.    Additional Follow-up by: Hoover Brunette, LPN,  April 09, 2010 4:25 PM

## 2010-09-14 NOTE — Letter (Signed)
Summary: External Correspondence/ [facility]  External Correspondence/ [facility]   Imported By: [name] 07/13/2010 09:32:39  _____________________________________________________________________  External Attachment:    Type:   Image     Comment:   External Document

## 2010-09-14 NOTE — Assessment & Plan Note (Signed)
Summary: eph  --agh  Medications Added METOPROLOL SUCCINATE 50 MG XR24H-TAB (METOPROLOL SUCCINATE) Take 1 tablet by mouth two times a day LANTUS 100 UNIT/ML SOLN (INSULIN GLARGINE) use as directed ASPIR-LOW 81 MG TBEC (ASPIRIN) Take 1 tablet by mouth once a day LISINOPRIL 20 MG TABS (LISINOPRIL) Take 1 tablet by mouth once a day AMIODARONE HCL 400 MG TABS (AMIODARONE HCL) Take 1 tablet by mouth once a day PRADAXA 150 MG CAPS (DABIGATRAN ETEXILATE MESYLATE) Take 1 tablet by mouth two times a day      Allergies Added: ! PCN  Primary Provider:  [name]   History of Present Illness: the patient is a 77 year old male with history of multivessel coronary disease status post prior percutaneous intervention, LV dysfunction with an ejection fraction of 45% but with NYHA class I symptoms. The patient has a CHADS2 score of 3. He was recently admitted with rapid atrial fibrillation. During the hospitalization he underwent a TEE guided cardioversion. He was also started on dabigatran and amiodarone. He had a successful procedure. He remained in normal sinus rhythm. 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  The patient stated doing quite well. He denies any chest pain shortness of breath orthopnea PND.  Preventive Screening-Counseling & Management  Alcohol-Tobacco     Smoking Status: quit     Year Quit: 1971  Current Medications (verified): 1)  Metoprolol Succinate 50 Mg Xr24h-Tab (Metoprolol Succinate) .... Take 1 Tablet By Mouth Two Times A Day 2)  Lantus 100 Unit/ml Soln (Insulin Glargine) .... Use As Directed 3)  Aspir-Low 81 Mg Tbec (Aspirin) .... Take 1 Tablet By Mouth Once A Day 4)  Lisinopril 20 Mg Tabs (Lisinopril) .... Take 1 Tablet By Mouth Once A Day 5)  Amiodarone Hcl 400 Mg Tabs (Amiodarone Hcl) .... Take 1 Tablet By Mouth Once A Day 6)  Pradaxa 150 Mg Caps (Dabigatran Etexilate Mesylate) .... Take 1 Tablet By Mouth Two Times A  Day  Allergies (verified): 1)  ! Pcn  Comments:  Nurse/Medical Assistant: The patient's medication list and allergies were reviewed with the patient and were updated in the Medication and Allergy Lists.  Past History:  Past Medical History: Last updated: 04/17/2009 RBBB (ICD-426.4) ATRIAL FIBRILLATION (ICD-427.31) HYPERTENSION, UNSPECIFIED (ICD-401.9) HYPERLIPIDEMIA-MIXED (ICD-272.4) SYSTOLIC HEART FAILURE, CHRONIC (ICD-428.22) CAD, NATIVE VESSEL (ICD-414.01) Chronic renal insufficiency  Family History: Last updated: 04/17/2009 Family History of Diabetes:  Family History of Hypertension:   Social History: Last updated: 04/17/2009 Retired  Married  Tobacco Use - No.  Alcohol Use - no Regular Exercise - yes  Risk Factors: Exercise: yes (04/17/2009)  Risk Factors: Smoking Status: quit (03/09/2010)  Social History: Smoking Status:  quit  Review of Systems  The patient denies fatigue, malaise, fever, weight gain/loss, vision loss, decreased hearing, hoarseness, chest pain, palpitations, shortness of breath, prolonged cough, wheezing, sleep apnea, coughing up blood, abdominal pain, blood in stool, nausea, vomiting, diarrhea, heartburn, incontinence, blood in urine, muscle weakness, joint pain, leg swelling, rash, skin lesions, headache, fainting, dizziness, depression, anxiety, enlarged lymph nodes, easy bruising or bleeding, and environmental allergies.    Vital Signs:  Patient profile:   77 year old male Height:      70 inches Weight:      221 pounds BMI:     31.82 Pulse rate:   60 / minute BP sitting:   130 / 73  (left arm) Cuff size:   large  Vitals Entered By: Carlye Grippe (March 09, 2010 2:31 PM)  Nutrition Counseling: Patient's BMI is greater than 25 and therefore counseled on weight management options.  Physical Exam  Additional Exam:  General: Well-developed, well-nourished in no distress head: Normocephalic and atraumatic eyes PERRLA/EOMI intact,  conjunctiva and lids normal nose: No deformity or lesions mouth normal dentition, normal posterior pharynx neck: Supple, no JVD.  No masses, thyromegaly or abnormal cervical nodes lungs: Normal breath sounds bilaterally without wheezing.  Normal percussion heart: regular rate and rhythm with normal S1 and S2, no S3 or S4.  PMI is normal.  No pathological murmurs abdomen: Normal bowel sounds, abdomen is soft and nontender without masses, organomegaly or hernias noted.  No hepatosplenomegaly musculoskeletal: Back normal, normal gait muscle strength and tone normal pulsus: Pulse is normal in all 4 extremities Extremities: No peripheral pitting edema neurologic: Alert and oriented x 3 skin: Intact without lesions or rashes cervical nodes: No significant adenopathy psychologic: Normal affect    EKG  Procedure date:  03/09/2010  Findings:      normal sinus rhythm. Right bundle branch block. Anteroseptal infarct pattern. No acute changes. Heart rate 62 beats per minute  Impression & Recommendations:  Problem # 1:  ATRIAL FIBRILLATION (ICD-427.31) status post successful cardioversion. Decrease amiodarone to 200 mg a day August 22. Followup liver function tests and TSH. His updated medication list for this problem includes:    Metoprolol Succinate 50 Mg Xr24h-tab (Metoprolol succinate) .Marland Kitchen... Take 1 tablet by mouth two times a day    Aspir-low 81 Mg Tbec (Aspirin) .Marland Kitchen... Take 1 tablet by mouth once a day    Amiodarone Hcl 400 Mg Tabs (Amiodarone hcl) .Marland Kitchen... Take 1 tablet by mouth once a day  Orders: EKG w/ Interpretation (93000) T-Lipid Profile (04540-98119) T-CBC No Diff ([id number])  Problem # 2:  ENCOUNTER FOR LONG-TERM USE OF ANTICOAGULANTS (ICD-V58.61) the patient has been started on dabigatran. This will be continued indefinitely. He is tolerating the medication well. According to protocol a CBC will be done in 10-14 days as well as a CBC,CMET every 6 months. The patient reports no  nausea.  Problem # 3:  RBBB (ICD-426.4) Assessment: Comment Only  His updated medication list for this problem includes:    Metoprolol Succinate 50 Mg Xr24h-tab (Metoprolol succinate) .Marland Kitchen... Take 1 tablet by mouth two times a day    Aspir-low 81 Mg Tbec (Aspirin) .Marland Kitchen... Take 1 tablet by mouth once a day    Lisinopril 20 Mg Tabs (Lisinopril) .Marland Kitchen... Take 1 tablet by mouth once a day    Amiodarone Hcl 400 Mg Tabs (Amiodarone hcl) .Marland Kitchen... Take 1 tablet by mouth once a day  Problem # 4:  CAD, NATIVE VESSEL (ICD-414.01) the patient has a prior history of coronary artery disease status post prior interventions. Has not had an ischemia workup in several years. The Eugenie Birks has been ordered as an outpatient. His updated medication list for this problem includes:    Metoprolol Succinate 50 Mg Xr24h-tab (Metoprolol succinate) .Marland Kitchen... Take 1 tablet by mouth two times a day    Aspir-low 81 Mg Tbec (Aspirin) .Marland Kitchen... Take 1 tablet by mouth once a day    Lisinopril 20 Mg Tabs (Lisinopril) .Marland Kitchen... Take 1 tablet by mouth once a day  Orders: T-Lipid Profile ([id number]) T-CBC No Diff ([id number]) Nuclear Med (Nuc Med)  Problem # 5:  RENAL INSUFFICIENCY, CHRONIC (ICD-585.9) creatinine will need to be carefully monitored. The patient was dosed with dabigatran 150 milligrams p.o. b.i.d.his creatinine clearance is 37 mL's per minute.  Patient  Instructions: 1)  Lexiscan stress test on meds and labs in 10-14 days 2)  Labs to be done with above:  CBC, FLP 3)  Decrease Amiodarone to 200mg  daily on 8/22 4)  Labs needed every 6 months to follow up on Amiodarone and Pradaxa  5)  Follow up in  6 months

## 2010-09-14 NOTE — Letter (Signed)
Summary: Lexiscan or Dobutamine Pharmacist, community at [facility]  [address]   [place], [place] [address]   Phone: [phone]  Fax: [phone]      [facility]  Lexiscan or Dobutamine Cardiolite Strss Test    Jeronimo Greaves  Appointment Date:_  Appointment Time:_  Your doctor has ordered a CARDIOLITE STRESS TEST using a medication to stimulate exercise so that you will not have to walk on the treadmill to determine the condition of your heart during stress. If you take blood pressure medication, ask your doctor if you should take it the day of your test. You should not have anything to eat or drink at least 4 hours before your test is scheduled, and no caffeine, including decaffeinated tea and coffee, chocolate, and soft drinks for 24 hours before your test.  You will need to register at the Outpatient/Main Entrance at the hospital 15 minutes before your appointment time. It is a good idea to bring a copy of your order with you. They will direct you to the Diagnostic Imaging (Radiology) Department.  You will be asked to undress from the waist up and given a hospital gown to wear, so dress comfortably from the waist down for example: Sweat pants, shorts, or skirt Rubber soled lace up shoes (tennis shoes)  Plan on about three hours from registration to release from the hospital

## 2010-09-15 ENCOUNTER — Encounter: Payer: Self-pay | Admitting: Cardiology

## 2010-09-15 DIAGNOSIS — I4891 Unspecified atrial fibrillation: Secondary | ICD-10-CM

## 2010-09-15 DIAGNOSIS — I517 Cardiomegaly: Secondary | ICD-10-CM

## 2010-09-15 DIAGNOSIS — I639 Cerebral infarction, unspecified: Secondary | ICD-10-CM

## 2010-09-15 DIAGNOSIS — I6350 Cerebral infarction due to unspecified occlusion or stenosis of unspecified cerebral artery: Secondary | ICD-10-CM

## 2010-09-15 HISTORY — DX: Cerebral infarction, unspecified: I63.9

## 2010-09-16 DIAGNOSIS — I669 Occlusion and stenosis of unspecified cerebral artery: Secondary | ICD-10-CM

## 2010-09-16 NOTE — Progress Notes (Signed)
Summary: voicemail message      Phone Note Other Incoming Call back at [phone]  OR  [phone]   Caller: wife - VOICEMAIL MESSAGE Summary of Call:  Sleeping extensively and gives out at nothing.  Has OV scheduled for 1/26 with GD.   Left message to return call.   Follow-up for Phone Call        I know this alrerady responded to this  Who took this message anyway. NOT SIGNED> THIS IS DOUBLE MESSAGING! Please find out if this was Lynden Ang and Danella Sensing?  Follow-up by: Lewayne Bunting, MD, [facility],  August 04, 2010 4:59 AM    Additional Follow-up for Phone Call Additional follow up Details #2::    Patient went to see his LMD- he bled on Pradaxa. Had to be transfused. Dr. [name] following.

## 2010-09-16 NOTE — Letter (Signed)
Summary: External Correspondence/ [facility]  External Correspondence/ [facility]   Imported By: [name] 08/18/2010 10:22:10  _____________________________________________________________________  External Attachment:    Type:   Image     Comment:   External Document

## 2010-09-16 NOTE — Medication Information (Signed)
Summary: [facility] D/C MEDICATION SHEET ORDER  [facility] D/C MEDICATION SHEET ORDER   Imported By: Zachary George 09/09/2010 13:49:12  _____________________________________________________________________  External Attachment:    Type:   Image     Comment:   External Document

## 2010-09-16 NOTE — Letter (Signed)
Summary: [facility] H&P--D/C DR. Selinda Flavin  [facility] H&P--D/C DR. Selinda Flavin   Imported By: Zachary George 09/09/2010 13:48:48  _____________________________________________________________________  External Attachment:    Type:   Image     Comment:   External Document

## 2010-09-16 NOTE — Letter (Signed)
Summary: External Correspondence/ [facility]  External Correspondence/ [facility]   Imported By: [name] 08/10/2010 10:13:44  _____________________________________________________________________  External Attachment:    Type:   Image     Comment:   External Document

## 2010-09-16 NOTE — Letter (Signed)
Summary: External Correspondence/ OFFICE NOTE [facility]  External Correspondence/ OFFICE NOTE [facility]   Imported By: [name] 08/26/2010 10:22:13  _____________________________________________________________________  External Attachment:    Type:   Image     Comment:   External Document

## 2010-09-17 ENCOUNTER — Encounter: Payer: Self-pay | Admitting: Cardiology

## 2010-09-17 NOTE — Op Note (Signed)
Summary: Operative Report/ PROCEDURE REPORT  Operative Report/ PROCEDURE REPORT   Imported By: [name] 03/09/2010 08:42:38  _____________________________________________________________________  External Attachment:    Type:   Image     Comment:   External Document

## 2010-09-21 ENCOUNTER — Encounter: Payer: Self-pay | Admitting: Cardiology

## 2010-09-21 ENCOUNTER — Encounter (INDEPENDENT_AMBULATORY_CARE_PROVIDER_SITE_OTHER): Payer: Medicare Other

## 2010-09-21 DIAGNOSIS — I4891 Unspecified atrial fibrillation: Secondary | ICD-10-CM

## 2010-09-21 DIAGNOSIS — I6789 Other cerebrovascular disease: Secondary | ICD-10-CM

## 2010-09-21 LAB — CONVERTED CEMR LAB: POC INR: 2

## 2010-09-24 ENCOUNTER — Encounter: Payer: Self-pay | Admitting: Cardiology

## 2010-09-24 ENCOUNTER — Encounter (INDEPENDENT_AMBULATORY_CARE_PROVIDER_SITE_OTHER): Payer: Medicare Other

## 2010-09-24 DIAGNOSIS — Z7901 Long term (current) use of anticoagulants: Secondary | ICD-10-CM

## 2010-09-24 DIAGNOSIS — I6789 Other cerebrovascular disease: Secondary | ICD-10-CM

## 2010-09-24 LAB — CONVERTED CEMR LAB: POC INR: 3.4

## 2010-09-27 ENCOUNTER — Encounter: Payer: Self-pay | Admitting: Cardiology

## 2010-09-30 NOTE — Medication Information (Signed)
Summary: eph d/c 2-3 starting on Coumdin needs fu 2-7 per Dr. Ernestine Mcmurray  Lab Visit  Orders Today:  Anticoagulant Therapy  Managed by: Vashti Hey RN Referring MD: Andee Lineman PCP: Sandrea Hughs MD: Diona Browner MD, Remi Deter Indication 1: Atrial Fibrillation Indication 2: Cerebrovascular Accident Lab Used: [facility] [place] Site: [place] INR POC 2.0  Dietary changes: no    Health status changes: no    Bleeding/hemorrhagic complications: no    Recent/future hospitalizations: yes       Details: IN [facility] 09/15/10 - 09/17/10 with CVA  restarted on coumadin   Has taken coumadin and pradaxa in the past with bleeding complication and anemia  Any changes in medication regimen? no    Recent/future dental: no  Any missed doses?: no       Is patient compliant with meds? yes         Anticoagulation Management History:      The patient comes in today for his initial visit for anticoagulation therapy.  Anticoagulation is being administered due to Atrial Fibrillation / CVA.  Positive risk factors for bleeding include an age of 49 years or older, history of CVA/TIA, history of GI bleeding, and presence of serious comorbidities.  The bleeding index is 'high risk'.  Positive CHADS2 values include History of CHF, History of HTN, Age > 50 years old, History of Diabetes, and Prior Stroke/CVA/TIA.  The start date was 09/17/2010.  Plans are to continue warfarin for life.  Anticoagulation responsible provider: Diona Browner MD, Remi Deter.  INR POC: 2.0.  Cuvette Lot#: [id number].    Anticoagulation Management Assessment/Plan:      The target INR is 2.0-3.0.  The next INR is due 09/24/2010.  Anticoagulation instructions were given to patient.  Results were reviewed/authorized by Vashti Hey RN.  He was notified by Vashti Hey RN.        Coagulation management information includes: 09/17/10 D/C from [facility] on coumadin 5mg  once daily   H/O GI bleed and anemia   has been on coumadin and Pradaxa in the past  Check  closely.  Current Anticoagulation Instructions: INR 2.0 Continue coumadin 5mg  once daily

## 2010-09-30 NOTE — Assessment & Plan Note (Signed)
Summary: 3 mo fu per jan reminder-srs  Medications Added FERROUS SULFATE 325 (65 FE) MG TABS (FERROUS SULFATE) Take 1 tablet by mouth two times a day VITAMIN C 500 MG TABS (ASCORBIC ACID) Take 1 tablet by mouth once a day      Allergies Added:   Visit Type:  Follow-up Primary Provider:  [name]   History of Present Illness: the patient is a 77 year old male who was recently admitted in the middle of December with a significant GI bleed with a hemoglobin of 7.6. Previously the patient had been started on dabigatran. He has a history of atrial fibrillation but has remained in normal sinus rhythm.the patient did receive blood transfusions and was admitted briefly to the hospital. Dabigatran was discontinued but unfortunately no GI workup was pursued in the meanwhile. Throat recommended the patient should have a GI workup during this office visit. The patient is at risk for stroke, not productive. He has history of ischemic heart disease with an ejection fraction of 40-45% and is status post stent placement in 2007. From a cardiac standpoint however he is stable and he reports no chest pain shortness of breath orthopnea PND. He has no palpitations or syncope.  Preventive Screening-Counseling & Management  Alcohol-Tobacco     Smoking Status: quit     Year Quit: 1971  Current Medications (verified): 1)  Metoprolol Succinate 50 Mg Xr24h-Tab (Metoprolol Succinate) .... Take 1 Tablet By Mouth Daily 2)  Lantus 100 Unit/ml Soln (Insulin Glargine) .... Use As Directed 3)  Aspir-Low 81 Mg Tbec (Aspirin) .... Take 1 Tablet By Mouth Once A Day 4)  Lisinopril 20 Mg Tabs (Lisinopril) .... Take 1 Tablet By Mouth Once A Day 5)  Ferrous Sulfate 325 (65 Fe) Mg Tabs (Ferrous Sulfate) .... Take 1 Tablet By Mouth Two Times A Day 6)  Vitamin C 500 Mg Tabs (Ascorbic Acid) .... Take 1 Tablet By Mouth Once A Day  Allergies (verified): 1)  ! Pcn  Comments:  Nurse/Medical Assistant: The patient's medication  list and allergies were reviewed with the patient and were updated in the Medication and Allergy Lists.  Past History:  Past Medical History: RBBB (ICD-426.4) ATRIAL FIBRILLATION (ICD-427.31) HYPERTENSION, UNSPECIFIED (ICD-401.9) HYPERLIPIDEMIA-MIXED (ICD-272.4) SYSTOLIC HEART FAILURE, CHRONIC (ICD-428.22) CAD, NATIVE VESSEL (ICD-414.01) Chronic renal insufficiency Echocardiogram 8/17 2011:ejection fraction 40-45%, apical septal and apical anterior apical lateral wall motion abnormality. Moderately decreased LV function. Myocardial perfusion study August 2011 large fixed apical and basal anterior/anteroseptal defect with akinesis consistent with prior infarct. No ischemia ejection fraction 36% Holter monitor heart rate 48-87 beats per minute with a mean of 60 beats per minute. August 2011 GI bleeding December 2011 hemoglobin 7.6 necessitating discontinuation of dabigatran  Review of Systems  The patient denies fatigue, malaise, fever, weight gain/loss, vision loss, decreased hearing, hoarseness, chest pain, palpitations, shortness of breath, prolonged cough, wheezing, sleep apnea, coughing up blood, abdominal pain, blood in stool, nausea, vomiting, diarrhea, heartburn, incontinence, blood in urine, muscle weakness, joint pain, leg swelling, rash, skin lesions, headache, fainting, dizziness, depression, anxiety, enlarged lymph nodes, easy bruising or bleeding, and environmental allergies.    Vital Signs:  Patient profile:   77 year old male Height:      70 inches Weight:      225 pounds Pulse rate:   65 / minute BP sitting:   139 / 71  (left arm) Cuff size:   large  Vitals Entered By: Carlye Grippe (September 09, 2010 2:33 PM)  Physical Exam  Additional Exam:  General: Well-developed, well-nourished in no distress head: Normocephalic and atraumatic eyes PERRLA/EOMI intact, conjunctiva and lids normal nose: No deformity or lesions mouth normal dentition, normal posterior  pharynx neck: Supple, no JVD.  No masses, thyromegaly or abnormal cervical nodes lungs: Normal breath sounds bilaterally without wheezing.  Normal percussion heart: regular rate and rhythm with normal S1 and S2, no S3 or S4.  PMI is normal.  No pathological murmurs abdomen: Normal bowel sounds, abdomen is soft and nontender without masses, organomegaly or hernias noted.  No hepatosplenomegaly musculoskeletal: Back normal, normal gait muscle strength and tone normal pulsus: Pulse is normal in all 4 extremities Extremities: 2-3+ peripheral pitting edema neurologic: Alert and oriented x 3 skin: Intact without lesions or rashes cervical nodes: No significant adenopathy psychologic: Normal affect    Impression & Recommendations:  Problem # 1:  ATRIAL FIBRILLATION (ICD-427.31) patient remains in normal sinus rhythm. Continue medical therapy. Unfortunately dabigatran had to be discontinued. I have recommended a GI workup.it should be done in timely fashion because the patient is at high risk for stroke given his multiple comorbidities and decreased LV function. His updated medication list for this problem includes:    Metoprolol Succinate 50 Mg Xr24h-tab (Metoprolol succinate) .Marland Kitchen... Take 1 tablet by mouth daily    Aspir-low 81 Mg Tbec (Aspirin) .Marland Kitchen... Take 1 tablet by mouth once a day  Problem # 2:  LEFT VENTRICULAR FUNCTION, DECREASED (ICD-429.2) the patient has ischemic heart disease. The ejection fraction is 40-45% but has no heart failure symptoms. He also has ischemic heart disease with a stent in 2007.  Problem # 3:  CAD, NATIVE VESSEL (ICD-414.01) status post stenting 2007. No recurrent chest pain. His updated medication list for this problem includes:    Metoprolol Succinate 50 Mg Xr24h-tab (Metoprolol succinate) .Marland Kitchen... Take 1 tablet by mouth daily    Aspir-low 81 Mg Tbec (Aspirin) .Marland Kitchen... Take 1 tablet by mouth once a day    Lisinopril 20 Mg Tabs (Lisinopril) .Marland Kitchen... Take 1 tablet by mouth  once a day  Patient Instructions: 1)  Your physician recommends that you continue on your current medications as directed. Please refer to the Current Medication list given to you today. 2)  Follow up in  6 months

## 2010-09-30 NOTE — Medication Information (Signed)
Summary: ccr-lr  Lab Visit  Orders Today:  Anticoagulant Therapy  Managed by: Vashti Hey RN Referring MD: Andee Lineman PCP: Sandrea Hughs MD: Diona Browner MD, Remi Deter Indication 1: Atrial Fibrillation Indication 2: Cerebrovascular Accident Lab Used: [facility] [place] Site: [place] INR POC 3.4  Dietary changes: no    Health status changes: no    Bleeding/hemorrhagic complications: no    Recent/future hospitalizations: no    Any changes in medication regimen? no    Recent/future dental: no  Any missed doses?: no       Is patient compliant with meds? yes         Anticoagulation Management History:      The patient is taking warfarin and comes in today for a routine follow up visit.  Anticoagulation is being administered due to Atrial Fibrillation / CVA.  Positive risk factors for bleeding include an age of 3 years or older, history of CVA/TIA, history of GI bleeding, and presence of serious comorbidities.  The bleeding index is 'high risk'.  Positive CHADS2 values include History of CHF, History of HTN, Age > 57 years old, History of Diabetes, and Prior Stroke/CVA/TIA.  The start date was 09/17/2010.  Plans are to continue warfarin for life.  Anticoagulation responsible provider: Diona Browner MD, Remi Deter.  INR POC: 3.4.  Cuvette Lot#: [id number].    Anticoagulation Management Assessment/Plan:      The target INR is 2.0-3.0.  The next INR is due 10/01/2010.  Anticoagulation instructions were given to patient.  Results were reviewed/authorized by Vashti Hey RN.  He was notified by Vashti Hey RN.         Prior Anticoagulation Instructions: INR 2.0 Continue coumadin 5mg  once daily   Current Anticoagulation Instructions: INR 3.4 Decrease coumadin to 5mg  once daily except 2.5mg  on Tuesdays and Fridays

## 2010-10-01 ENCOUNTER — Encounter (INDEPENDENT_AMBULATORY_CARE_PROVIDER_SITE_OTHER): Payer: Medicare Other

## 2010-10-01 ENCOUNTER — Encounter: Payer: Self-pay | Admitting: Cardiology

## 2010-10-01 DIAGNOSIS — I4891 Unspecified atrial fibrillation: Secondary | ICD-10-CM

## 2010-10-01 DIAGNOSIS — Z7901 Long term (current) use of anticoagulants: Secondary | ICD-10-CM

## 2010-10-01 DIAGNOSIS — I6789 Other cerebrovascular disease: Secondary | ICD-10-CM

## 2010-10-01 LAB — CONVERTED CEMR LAB: POC INR: 5.3

## 2010-10-06 ENCOUNTER — Encounter: Payer: Self-pay | Admitting: Cardiology

## 2010-10-06 ENCOUNTER — Encounter (INDEPENDENT_AMBULATORY_CARE_PROVIDER_SITE_OTHER): Payer: Medicare Other | Admitting: Cardiology

## 2010-10-06 DIAGNOSIS — D649 Anemia, unspecified: Secondary | ICD-10-CM

## 2010-10-06 DIAGNOSIS — I4891 Unspecified atrial fibrillation: Secondary | ICD-10-CM

## 2010-10-06 DIAGNOSIS — I6350 Cerebral infarction due to unspecified occlusion or stenosis of unspecified cerebral artery: Secondary | ICD-10-CM | POA: Insufficient documentation

## 2010-10-06 NOTE — Medication Information (Signed)
Summary: ccr-lr  Anticoagulant Therapy  Managed by: Vashti Hey RN Referring MD: Andee Lineman PCP: Sandrea Hughs MD: Myrtis Ser MD, Tinnie Gens Indication 1: Atrial Fibrillation Indication 2: Cerebrovascular Accident Lab Used: [facility] [place] Site: [place] INR POC 5.3  Dietary changes: no    Health status changes: no    Bleeding/hemorrhagic complications: no    Recent/future hospitalizations: no    Any changes in medication regimen? no    Recent/future dental: no  Any missed doses?: no       Is patient compliant with meds? yes       Allergies: 1)  ! Pcn  Anticoagulation Management History:      The patient is taking warfarin and comes in today for a routine follow up visit.  Anticoagulation is being administered due to Atrial Fibrillation / CVA.  Positive risk factors for bleeding include an age of 55 years or older, history of CVA/TIA, history of GI bleeding, and presence of serious comorbidities.  The bleeding index is 'high risk'.  Positive CHADS2 values include History of CHF, History of HTN, Age > 9 years old, History of Diabetes, and Prior Stroke/CVA/TIA.  The start date was 09/17/2010.  Plans are to continue warfarin for life.  Anticoagulation responsible provider: Myrtis Ser MD, Tinnie Gens.  INR POC: 5.3.  Cuvette Lot#: [id number].    Anticoagulation Management Assessment/Plan:      The target INR is 2.0-3.0.  The next INR is due 10/08/2010.  Anticoagulation instructions were given to patient.  Results were reviewed/authorized by Vashti Hey RN.  He was notified by Vashti Hey RN.         Prior Anticoagulation Instructions: INR 3.4 Decrease coumadin to 5mg  once daily except 2.5mg  on Tuesdays and Fridays  Current Anticoagulation Instructions: INR 5.3 Hold coumadin tonight and tomorrow night then decrease dose to 2.5mg  once daily except 5mg  on Mondays and Thursdays

## 2010-10-06 NOTE — Medication Information (Signed)
Summary: RX Folder/FAXED [facility]  RX Folder/FAXED [facility]   Imported By: [name] 09/28/2010 09:10:47  _____________________________________________________________________  External Attachment:    Type:   Image     Comment:   External Document

## 2010-10-07 ENCOUNTER — Encounter: Payer: Self-pay | Admitting: Cardiology

## 2010-10-07 LAB — CONVERTED CEMR LAB: POC INR: 2.5

## 2010-10-12 ENCOUNTER — Encounter (INDEPENDENT_AMBULATORY_CARE_PROVIDER_SITE_OTHER): Payer: Self-pay | Admitting: *Deleted

## 2010-10-12 NOTE — Medication Information (Signed)
Summary: [facility] D/C MEDICATION SHEET ORDER  [facility] D/[facility] MEDICATION SHEET ORDER   Imported By: Zachary George 10/06/2010 11:15:21  _____________________________________________________________________  External Attachment:    Type:   Image     Comment:   External Document

## 2010-10-12 NOTE — Assessment & Plan Note (Signed)
Summary: eph d/c [facility] 2-3 needs 3 wk fu per Dr. Ernestine Mcmurray  Medications Added METOPROLOL TARTRATE 25 MG TABS (METOPROLOL TARTRATE) Take one tablet by mouth twice a day FUROSEMIDE 40 MG TABS (FUROSEMIDE) Take one tablet by mouth daily. WARFARIN SODIUM 5 MG TABS (WARFARIN SODIUM) Use as directed by Anticoagulation Clinic        Visit Type:  Follow-up Primary Provider:  [name]   History of Present Illness: The patient was recently discharged from the hospital with a left cerebrovascular accident with right hemiparesis predominately of the right hand, significantly improved at the time of discharge.  It was felt that this was an embolic event secondary to intermittent atrial fibrillation.  The patient was not taking Coumadin or dabigatran due to a prior history of GI bleeding.  The repeat EGD and colonoscopy were negative.  The patient was restarted on Coumadin during this hospitalization.  The patient had carotid Dopplers done with minimal disease.  He also had an echocardiogram done during this hospitalization which showed an ejection fraction of 40 to 45% with segmental wall motion abnormalities of the mid anteroseptal and inferoseptal areas.  No thrombus was visualized at the apex was akinetic.  There was no other significant valvular heart disease.  The patient had a Lexiscan in August of 2011 which showed an ejection fraction of 36% with a large fixed anterior apical/septal defect with akinesis consistent with prior myocardial infarction. From a cardiac standpoint the patient is doing quite well.  EKG done today demonstrates normal sinus rhythm with right bundle branch block which is chronic.  He continues to take iron.  He denies any chest pain or shortness of breath.  Is doing much better with the grip in his right hand.   Preventive Screening-Counseling & Management  Alcohol-Tobacco     Smoking Status: quit     Year Quit: 1971  Comments: started smoking about 77 yrs old  Allergies: 1)   ! Pcn 2)  ! * Pradaxa  Past History:  Past Medical History: Last updated: 09/09/2010 RBBB (ICD-426.4) ATRIAL FIBRILLATION (ICD-427.31) HYPERTENSION, UNSPECIFIED (ICD-401.9) HYPERLIPIDEMIA-MIXED (ICD-272.4) SYSTOLIC HEART FAILURE, CHRONIC (ICD-428.22) CAD, NATIVE VESSEL (ICD-414.01) Chronic renal insufficiency Echocardiogram 8/17 2011:ejection fraction 40-45%, apical septal and apical anterior apical lateral wall motion abnormality. Moderately decreased LV function. Myocardial perfusion study August 2011 large fixed apical and basal anterior/anteroseptal defect with akinesis consistent with prior infarct. No ischemia ejection fraction 36% Holter monitor heart rate 48-87 beats per minute with a mean of 60 beats per minute. August 2011 GI bleeding December 2011 hemoglobin 7.6 necessitating discontinuation of dabigatran  Family History: Last updated: 04/17/2009 Family History of Diabetes:  Family History of Hypertension:   Social History: Last updated: 04/17/2009 Retired  Married  Tobacco Use - No.  Alcohol Use - no Regular Exercise - yes  Risk Factors: Smoking Status: quit (10/06/2010)  Review of Systems  The patient denies fatigue, malaise, fever, weight gain/loss, vision loss, decreased hearing, hoarseness, chest pain, palpitations, shortness of breath, prolonged cough, wheezing, sleep apnea, coughing up blood, abdominal pain, blood in stool, nausea, vomiting, diarrhea, heartburn, incontinence, blood in urine, muscle weakness, joint pain, leg swelling, rash, skin lesions, headache, fainting, dizziness, depression, anxiety, enlarged lymph nodes, easy bruising or bleeding, and environmental allergies.    Vital Signs:  Patient profile:   77 year old male Height:      70 inches Weight:      224.25 pounds Pulse rate:   63 / minute BP sitting:   133 /  63  (left arm) Cuff size:   large  Vitals Entered By: Cyril Loosen, RN, BSN (October 06, 2010 11:27 AM) Comments Post  hospital office visit. No cardiac complaints   Physical Exam  Additional Exam:  General: Well-developed, well-nourished in no distress head: Normocephalic and atraumatic eyes PERRLA/EOMI intact, conjunctiva and lids normal nose: No deformity or lesions mouth normal dentition, normal posterior pharynx neck: Supple, no JVD.  No masses, thyromegaly or abnormal cervical nodes lungs: Normal breath sounds bilaterally without wheezing.  Normal percussion heart: regular rate and rhythm with normal S1 and S2, no S3 or S4.  PMI is normal.  No pathological murmurs abdomen: Normal bowel sounds, abdomen is soft and nontender without masses, organomegaly or hernias noted.  No hepatosplenomegaly musculoskeletal: Back normal, normal gait muscle strength and tone normal pulsus: Pulse is normal in all 4 extremities Extremities: 2-3+ peripheral pitting edema neurologic: Alert and oriented x 3a, still some decreased strength in right hand skin: Intact without lesions or rashes cervical nodes: No significant adenopathy psychologic: Normal affect    EKG  Procedure date:  10/07/2010  Findings:      rule sinus rhythm.  Left axis deviation right bundle branch block  old anteroseptal infarct pattern.  Heart rate 64 beats/min  Impression & Recommendations:  Problem # 1:  CVA (ICD-434.91) status post acute left brain stroke.  Presumed embolic. Orders: T-CBC w/Diff ([id number]) T-Ferritin [phone])  His updated medication list for this problem includes:    Aspir-low 81 Mg Tbec (Aspirin) .Marland Kitchen... Take 1 tablet by mouth once a day    Warfarin Sodium 5 Mg Tabs (Warfarin sodium) ..... Use as directed by anticoagulation clinic  Problem # 2:  ATRIAL FIBRILLATION (ICD-427.31) Paroxysmal atrial fibrillation in normal sinus rhythm: Status post TEE and cardioversion August 2011: The patient remains in normal sinus rhythm.  He reports no palpitations His updated medication list for this problem includes:     Metoprolol Tartrate 25 Mg Tabs (Metoprolol tartrate) .Marland Kitchen... Take one tablet by mouth twice a day    Aspir-low 81 Mg Tbec (Aspirin) .Marland Kitchen... Take 1 tablet by mouth once a day    Warfarin Sodium 5 Mg Tabs (Warfarin sodium) ..... Use as directed by anticoagulation clinic  Orders: EKG w/ Interpretation (93000) T-CBC w/Diff ([id number]) T-Ferritin ([id number])  Problem # 3:  LEFT VENTRICULAR FUNCTION, DECREASED (ICD-429.2) ischemic cardiomyopathy ejection fraction 40 to 45%.  Stable  Problem # 4:  ANEMIA (ICD-285.9) GI bleeding on dabigatran December 2011: CBC and ferritin will be checked. Coumadin anticoagulation: Restarted after stroke INR 2.5 today.  Orders: T-CBC w/Diff ([id number]) T-Ferritin [phone])  Patient Instructions: 1)  Labs:  CBC, FERRITIN 2)  Follow up in  6 months

## 2010-10-12 NOTE — Consult Note (Signed)
Summary: Consultation Report/ CARDIOLOGY  Consultation Report/ CARDIOLOGY   Imported By: [name] 10/06/2010 10:51:25  _____________________________________________________________________  External Attachment:    Type:   Image     Comment:   External Document

## 2010-10-12 NOTE — Letter (Signed)
Summary: Discharge Summary/ [facility] DR[facility] [name]  Discharge Summary/ [facility] DR. [name]   Imported By: [name] 10/06/2010 10:56:33  _____________________________________________________________________  External Attachment:    Type:   Image     Comment:   External Document

## 2010-10-12 NOTE — Medication Information (Addendum)
Summary: Coumadin Clinic  Anticoagulant Therapy  Managed by: Cyril Loosen, RN Referring MD: Andee Lineman PCP: Sandrea Hughs MD: Dietrich Pates MD, Molly Maduro Indication 1: Atrial Fibrillation Indication 2: Cerebrovascular Accident Lab Used: [facility] [place] Site: [place] INR POC 2.5  Dietary changes: no    Health status changes: no    Bleeding/hemorrhagic complications: no    Recent/future hospitalizations: no    Any changes in medication regimen? no    Recent/future dental: no  Any missed doses?: no       Is patient compliant with meds? yes       Allergies: 1)  ! Pcn 2)  ! * Pradaxa  Anticoagulation Management History:      The patient is taking warfarin and comes in today for a routine follow up visit.  Anticoagulation is being administered due to Atrial Fibrillation / CVA.  Positive risk factors for bleeding include an age of 74 years or older, history of CVA/TIA, history of GI bleeding, and presence of serious comorbidities.  The bleeding index is 'high risk'.  Positive CHADS2 values include History of CHF, History of HTN, Age > 87 years old, History of Diabetes, and Prior Stroke/CVA/TIA.  The start date was 09/17/2010.  Plans are to continue warfarin for life.  Anticoagulation responsible provider: Dietrich Pates MD, Molly Maduro.  INR POC: 2.5.    Anticoagulation Management Assessment/Plan:      The patient's current anticoagulation dose is Warfarin sodium 5 mg tabs: Use as directed by Anticoagulation Clinic.  The target INR is 2.0-3.0.  The next INR is due 10/15/2010.  Anticoagulation instructions were given to patient.  Results were reviewed/authorized by Cyril Loosen, RN.  He was notified by Cyril Loosen, RN.         Prior Anticoagulation Instructions: INR 5.3 Hold coumadin tonight and tomorrow night then decrease dose to 2.5mg  once daily except 5mg  on Mondays and Thursdays  Current Anticoagulation Instructions: INR 2.5 Continue coumadin 2.5mg  once daily except  5mg  on Mondays and Thursdays

## 2010-10-12 NOTE — Consult Note (Signed)
Summary: Consultation Report/ DR. Mikal Plane  Consultation Report/ DR. Mikal Plane   Imported By: [name] 10/06/2010 11:09:11  _____________________________________________________________________  External Attachment:    Type:   Image     Comment:   External Document

## 2010-10-21 NOTE — Letter (Signed)
Summary: Engineer, materials at [facility]  [address]   [place], [place] [address]   Phone: [phone]  Fax: [phone]        October 12, 2010 MRN: [id number]   [name] [name] [address] [place], [place]  [address]   Dear [name] [name],  Your test ordered by Selena Batten has been reviewed by your physician (or physician assistant) and was found to be normal or stable. Your physician (or physician assistant) felt no changes were needed at this time.  ____ Echocardiogram  ____ Cardiac Stress Test  __x__ Lab Work  ____ Peripheral vascular study of arms, legs or neck  ____ CT scan or X-ray  ____ Lung or Breathing test  ____ Other:  LAB WORK WITHIN NORMAL LIMITS. FERRITIN SOMEWHAT LOW, REFER TO PRIMARY PHYSICIAN.   Thank you.   Tammi Romine CMA    Duane Boston, M.D., F.A.C.C. Thressa Sheller, M.D., F.A.C.C. Oneal Grout, M.D., F.A.C.C. Cheree Ditto, M.D., F.A.C.C. Daiva Nakayama, M.D., F.A.C.C. Kenney Houseman, M.D., F.A.C.C. Jeanne Ivan, PA-C

## 2010-10-22 ENCOUNTER — Encounter (INDEPENDENT_AMBULATORY_CARE_PROVIDER_SITE_OTHER): Payer: Medicare Other

## 2010-10-22 ENCOUNTER — Encounter: Payer: Self-pay | Admitting: Cardiology

## 2010-10-22 DIAGNOSIS — Z7901 Long term (current) use of anticoagulants: Secondary | ICD-10-CM

## 2010-10-22 DIAGNOSIS — I4891 Unspecified atrial fibrillation: Secondary | ICD-10-CM

## 2010-10-22 LAB — CONVERTED CEMR LAB: POC INR: 3.4

## 2010-10-26 NOTE — Medication Information (Signed)
Summary: [facility]-LAST CK 10/06/10-JM  Anticoagulant Therapy  Managed by: Vashti Hey, RN Referring MD: Andee Lineman PCP: Sandrea Hughs MD: Andee Lineman MD, Michelle Piper Indication 1: Atrial Fibrillation Indication 2: Cerebrovascular Accident Lab Used: [facility] [place] Site: [place] INR POC 3.4  Dietary changes: no    Health status changes: no    Bleeding/hemorrhagic complications: no    Recent/future hospitalizations: no    Any changes in medication regimen? no    Recent/future dental: no  Any missed doses?: no       Is patient compliant with meds? yes       Allergies: 1)  ! Pcn 2)  ! * Pradaxa  Anticoagulation Management History:      The patient is taking warfarin and comes in today for a routine follow up visit.  Anticoagulation is being administered due to Atrial Fibrillation / CVA.  Positive risk factors for bleeding include an age of 75 years or older, history of CVA/TIA, history of GI bleeding, and presence of serious comorbidities.  The bleeding index is 'high risk'.  Positive CHADS2 values include History of CHF, History of HTN, Age > 60 years old, History of Diabetes, and Prior Stroke/CVA/TIA.  The start date was 09/17/2010.  Plans are to continue warfarin for life.  Anticoagulation responsible provider: Andee Lineman MD, Michelle Piper.  INR POC: 3.4.  Cuvette Lot#: [id number].    Anticoagulation Management Assessment/Plan:      The patient's current anticoagulation dose is Warfarin sodium 5 mg tabs: Use as directed by Anticoagulation Clinic.  The target INR is 2.0-3.0.  The next INR is due 11/09/2010.  Anticoagulation instructions were given to patient.  Results were reviewed/authorized by Vashti Hey, RN.  He was notified by Vashti Hey RN.         Prior Anticoagulation Instructions: INR 2.5 Continue coumadin 2.5mg  once daily except 5mg  on Mondays and Thursdays  Current Anticoagulation Instructions: INR 3.4 Hold coumadin tonight then decrease dose to 2.5mg  once daily except 5mg  on  Mondays

## 2010-11-06 ENCOUNTER — Encounter: Payer: Self-pay | Admitting: Cardiology

## 2010-11-06 DIAGNOSIS — I4891 Unspecified atrial fibrillation: Secondary | ICD-10-CM

## 2010-11-06 DIAGNOSIS — I6350 Cerebral infarction due to unspecified occlusion or stenosis of unspecified cerebral artery: Secondary | ICD-10-CM

## 2010-11-06 DIAGNOSIS — I4892 Unspecified atrial flutter: Secondary | ICD-10-CM

## 2010-11-06 DIAGNOSIS — Z7901 Long term (current) use of anticoagulants: Secondary | ICD-10-CM

## 2010-11-08 ENCOUNTER — Other Ambulatory Visit: Payer: Self-pay | Admitting: *Deleted

## 2010-11-08 DIAGNOSIS — I5022 Chronic systolic (congestive) heart failure: Secondary | ICD-10-CM

## 2010-11-08 MED ORDER — FUROSEMIDE 20 MG PO TABS: 20.0000 mg | ORAL_TABLET | Freq: Every day | ORAL | Status: DC

## 2010-11-09 ENCOUNTER — Ambulatory Visit (INDEPENDENT_AMBULATORY_CARE_PROVIDER_SITE_OTHER): Payer: Medicare Other | Admitting: *Deleted

## 2010-11-09 DIAGNOSIS — I6350 Cerebral infarction due to unspecified occlusion or stenosis of unspecified cerebral artery: Secondary | ICD-10-CM

## 2010-11-09 DIAGNOSIS — I4891 Unspecified atrial fibrillation: Secondary | ICD-10-CM

## 2010-11-09 DIAGNOSIS — Z7901 Long term (current) use of anticoagulants: Secondary | ICD-10-CM

## 2010-11-09 DIAGNOSIS — I4892 Unspecified atrial flutter: Secondary | ICD-10-CM

## 2010-11-09 LAB — POCT INR: INR: 2.3

## 2010-11-30 ENCOUNTER — Inpatient Hospital Stay (HOSPITAL_COMMUNITY)
Admission: AD | Admit: 2010-11-30 | Discharge: 2010-12-04 | DRG: 281 | Disposition: A | Discharge status: Home / Self Care | Payer: Medicare Other | Source: Other Acute Inpatient Hospital | Attending: Cardiology | Admitting: Cardiology

## 2010-11-30 DIAGNOSIS — I4891 Unspecified atrial fibrillation: Secondary | ICD-10-CM | POA: Diagnosis present

## 2010-11-30 DIAGNOSIS — Z7901 Long term (current) use of anticoagulants: Secondary | ICD-10-CM

## 2010-11-30 DIAGNOSIS — I4510 Unspecified right bundle-branch block: Secondary | ICD-10-CM | POA: Diagnosis present

## 2010-11-30 DIAGNOSIS — Z7982 Long term (current) use of aspirin: Secondary | ICD-10-CM

## 2010-11-30 DIAGNOSIS — I509 Heart failure, unspecified: Secondary | ICD-10-CM | POA: Diagnosis present

## 2010-11-30 DIAGNOSIS — I2510 Atherosclerotic heart disease of native coronary artery without angina pectoris: Secondary | ICD-10-CM | POA: Diagnosis present

## 2010-11-30 DIAGNOSIS — I5022 Chronic systolic (congestive) heart failure: Secondary | ICD-10-CM | POA: Diagnosis present

## 2010-11-30 DIAGNOSIS — Z8673 Personal history of transient ischemic attack (TIA), and cerebral infarction without residual deficits: Secondary | ICD-10-CM

## 2010-11-30 DIAGNOSIS — I214 Non-ST elevation (NSTEMI) myocardial infarction: Principal | ICD-10-CM | POA: Diagnosis present

## 2010-11-30 DIAGNOSIS — Z794 Long term (current) use of insulin: Secondary | ICD-10-CM

## 2010-11-30 DIAGNOSIS — I2589 Other forms of chronic ischemic heart disease: Secondary | ICD-10-CM | POA: Diagnosis present

## 2010-11-30 DIAGNOSIS — I129 Hypertensive chronic kidney disease with stage 1 through stage 4 chronic kidney disease, or unspecified chronic kidney disease: Secondary | ICD-10-CM | POA: Diagnosis present

## 2010-11-30 DIAGNOSIS — N189 Chronic kidney disease, unspecified: Secondary | ICD-10-CM | POA: Diagnosis present

## 2010-11-30 DIAGNOSIS — Z880 Allergy status to penicillin: Secondary | ICD-10-CM

## 2010-11-30 DIAGNOSIS — E785 Hyperlipidemia, unspecified: Secondary | ICD-10-CM | POA: Diagnosis present

## 2010-11-30 DIAGNOSIS — K5289 Other specified noninfective gastroenteritis and colitis: Secondary | ICD-10-CM | POA: Diagnosis present

## 2010-11-30 LAB — CARDIAC PANEL(CRET KIN+CKTOT+MB+TROPI)
CK, MB: 64.5 ng/mL (ref 0.3–4.0)
Relative Index: 10.4 — ABNORMAL HIGH (ref 0.0–2.5)
Total CK: 623 U/L — ABNORMAL HIGH (ref 7–232)
Troponin I: 4.68 ng/mL (ref 0.00–0.06)

## 2010-11-30 LAB — MRSA PCR SCREENING: MRSA by PCR: NEGATIVE

## 2010-11-30 LAB — GLUCOSE, CAPILLARY: Glucose-Capillary: 89 mg/dL (ref 70–99)

## 2010-12-01 ENCOUNTER — Other Ambulatory Visit (HOSPITAL_COMMUNITY): Payer: Medicare Other

## 2010-12-01 ENCOUNTER — Inpatient Hospital Stay (HOSPITAL_COMMUNITY): Payer: Medicare Other

## 2010-12-01 DIAGNOSIS — R0602 Shortness of breath: Secondary | ICD-10-CM

## 2010-12-01 LAB — BASIC METABOLIC PANEL
BUN: 36 mg/dL — ABNORMAL HIGH (ref 6–23)
Calcium: 8.4 mg/dL (ref 8.4–10.5)
Creatinine, Ser: 1.68 mg/dL — ABNORMAL HIGH (ref 0.4–1.5)
GFR calc non Af Amer: 40 mL/min — ABNORMAL LOW (ref >60)
Glucose, Bld: 142 mg/dL — ABNORMAL HIGH (ref 70–99)
Sodium: 136 mEq/L (ref 135–145)

## 2010-12-01 LAB — PROTIME-INR
INR: 1.72 — ABNORMAL HIGH (ref 0.00–1.49)
Prothrombin Time: 20.3 seconds — ABNORMAL HIGH (ref 11.6–15.2)

## 2010-12-01 LAB — GLUCOSE, CAPILLARY: Glucose-Capillary: 91 mg/dL (ref 70–99)

## 2010-12-01 LAB — CBC
HCT: 35.3 % — ABNORMAL LOW (ref 39.0–52.0)
Hemoglobin: 12.1 g/dL — ABNORMAL LOW (ref 13.0–17.0)
RBC: 3.86 MIL/uL — ABNORMAL LOW (ref 4.22–5.81)
WBC: 5.9 10*3/uL (ref 4.0–10.5)

## 2010-12-01 LAB — CARDIAC PANEL(CRET KIN+CKTOT+MB+TROPI)
CK, MB: 40 ng/mL (ref 0.3–4.0)
Relative Index: 7.7 — ABNORMAL HIGH (ref 0.0–2.5)
Total CK: 615 U/L — ABNORMAL HIGH (ref 7–232)

## 2010-12-01 LAB — HEPARIN LEVEL (UNFRACTIONATED): Heparin Unfractionated: 0.5 IU/mL (ref 0.30–0.70)

## 2010-12-01 MED ORDER — TECHNETIUM TC 99M TETROFOSMIN IV KIT
10.0000 | PACK | Freq: Once | INTRAVENOUS | Status: AC | PRN
  Administered 2010-12-01: 10 via INTRAVENOUS

## 2010-12-02 ENCOUNTER — Other Ambulatory Visit (HOSPITAL_COMMUNITY): Payer: Medicare Other

## 2010-12-02 LAB — CBC
HCT: 34.9 % — ABNORMAL LOW (ref 39.0–52.0)
Hemoglobin: 11.9 g/dL — ABNORMAL LOW (ref 13.0–17.0)
MCH: 31.3 pg (ref 26.0–34.0)
MCHC: 34.1 g/dL (ref 30.0–36.0)
MCV: 91.8 fL (ref 78.0–100.0)
Platelets: 132 K/uL — ABNORMAL LOW (ref 150–400)
RBC: 3.8 MIL/uL — ABNORMAL LOW (ref 4.22–5.81)
RDW: 13.6 % (ref 11.5–15.5)
WBC: 5.1 K/uL (ref 4.0–10.5)

## 2010-12-02 LAB — PROTIME-INR
INR: 1.72 — ABNORMAL HIGH (ref 0.00–1.49)
Prothrombin Time: 20.3 s — ABNORMAL HIGH (ref 11.6–15.2)

## 2010-12-02 LAB — GLUCOSE, CAPILLARY
Glucose-Capillary: 117 mg/dL — ABNORMAL HIGH (ref 70–99)
Glucose-Capillary: 105 mg/dL — ABNORMAL HIGH (ref 70–99)

## 2010-12-02 LAB — LIPID PANEL
LDL Cholesterol: 19 mg/dL (ref 0–99)
Triglycerides: 366 mg/dL — ABNORMAL HIGH (ref <150)
VLDL: 73 mg/dL — ABNORMAL HIGH (ref 0–40)

## 2010-12-02 LAB — BASIC METABOLIC PANEL
Calcium: 8.2 mg/dL — ABNORMAL LOW (ref 8.4–10.5)
Creatinine, Ser: 1.35 mg/dL (ref 0.4–1.5)
GFR calc Af Amer: >60 mL/min (ref >60)
GFR calc non Af Amer: 51 mL/min — ABNORMAL LOW (ref >60)
Sodium: 136 mEq/L (ref 135–145)

## 2010-12-02 LAB — HEPARIN LEVEL (UNFRACTIONATED): Heparin Unfractionated: 0.32 [IU]/mL (ref 0.30–0.70)

## 2010-12-03 DIAGNOSIS — I2589 Other forms of chronic ischemic heart disease: Secondary | ICD-10-CM

## 2010-12-03 LAB — GLUCOSE, CAPILLARY
Glucose-Capillary: 127 mg/dL — ABNORMAL HIGH (ref 70–99)
Glucose-Capillary: 197 mg/dL — ABNORMAL HIGH (ref 70–99)
Glucose-Capillary: 88 mg/dL (ref 70–99)
Glucose-Capillary: 156 mg/dL — ABNORMAL HIGH (ref 70–99)
Glucose-Capillary: 122 mg/dL — ABNORMAL HIGH (ref 70–99)
Glucose-Capillary: 155 mg/dL — ABNORMAL HIGH (ref 70–99)

## 2010-12-03 LAB — PROTIME-INR
INR: 2.13 — ABNORMAL HIGH (ref 0.00–1.49)
Prothrombin Time: 24 seconds — ABNORMAL HIGH (ref 11.6–15.2)

## 2010-12-03 LAB — BASIC METABOLIC PANEL
Calcium: 8.4 mg/dL (ref 8.4–10.5)
GFR calc non Af Amer: 49 mL/min — ABNORMAL LOW (ref >60)
Glucose, Bld: 103 mg/dL — ABNORMAL HIGH (ref 70–99)
Potassium: 4 mEq/L (ref 3.5–5.1)
Sodium: 136 mEq/L (ref 135–145)

## 2010-12-03 LAB — CBC
HCT: 32.6 % — ABNORMAL LOW (ref 39.0–52.0)
MCHC: 34.4 g/dL (ref 30.0–36.0)
RDW: 13.8 % (ref 11.5–15.5)
WBC: 5 10*3/uL (ref 4.0–10.5)

## 2010-12-04 LAB — BASIC METABOLIC PANEL
BUN: 18 mg/dL (ref 6–23)
Creatinine, Ser: 1.26 mg/dL (ref 0.4–1.5)
GFR calc Af Amer: >60 mL/min (ref >60)
GFR calc non Af Amer: 55 mL/min — ABNORMAL LOW (ref >60)

## 2010-12-04 LAB — CBC
MCH: 31.8 pg (ref 26.0–34.0)
MCV: 92.1 fL (ref 78.0–100.0)
Platelets: 135 10*3/uL — ABNORMAL LOW (ref 150–400)
RBC: 3.65 MIL/uL — ABNORMAL LOW (ref 4.22–5.81)
RDW: 13.5 % (ref 11.5–15.5)
WBC: 5.1 10*3/uL (ref 4.0–10.5)

## 2010-12-04 LAB — GLUCOSE, CAPILLARY: Glucose-Capillary: 107 mg/dL — ABNORMAL HIGH (ref 70–99)

## 2010-12-04 LAB — HEPARIN LEVEL (UNFRACTIONATED): Heparin Unfractionated: 0.52 IU/mL (ref 0.30–0.70)

## 2010-12-04 LAB — PROTIME-INR: Prothrombin Time: 25.4 seconds — ABNORMAL HIGH (ref 11.6–15.2)

## 2010-12-05 NOTE — Op Note (Signed)
  NAMESPYRIDON, HORNSTEIN               ACCOUNT NO.:  [id number]  MEDICAL RECORD NO.:  [id number]          PATIENT TYPE:  LOCATION:                                 FACILITY:  PHYSICIAN:  [name] [name]. Jens Som, MD, FACCDATE OF BIRTH:  30-Sep-1928  DATE OF PROCEDURE:  12/02/2010 DATE OF DISCHARGE:                              OPERATIVE REPORT   This is cardioversion of atrial fibrillation.  The patient was sedated by Anesthesia with Diprivan 80 mg intravenously.  Synchronized cardioversion with 120 joules resulted in sinus rhythm.  There were no immediate complications.  We would recommend continuing heparin until INR is greater than or equal to two.     [name] [name] Jens Som, MD, [facility]     BSC/MEDQ  D:  12/02/2010  T:  12/03/2010  Job:  [id number]  Electronically Signed by Olga Millers MD [facility] on 12/05/2010 01:25:42 PM

## 2010-12-07 ENCOUNTER — Encounter: Payer: Medicare Other | Admitting: *Deleted

## 2010-12-08 NOTE — Discharge Summary (Signed)
NAMEJANSSEN, [name] [name]               ACCOUNT NO.:  [id number]  MEDICAL RECORD NO.:  [id number]           PATIENT TYPE:  I  LOCATION:  4709                         FACILITY:  [facility]  PHYSICIAN:  Madolyn Frieze. Jens Som, MD, FACCDATE OF BIRTH:  May 22, 1929  DATE OF ADMISSION:  11/30/2010 DATE OF DISCHARGE:  12/04/2010                              DISCHARGE SUMMARY   PRIMARY CARDIOLOGIST:  [name], MD, [facility]  DISCHARGE DIAGNOSES: 1. Recurrent paroxysmal atrial fibrillation with rapid ventricular     response.     a.     Status post successful direct current cardioversion, this      admission. 2. Non-ST-elevation myocardial infarction.     a.     Status post cardiac catheterization:  Chronic 100% occlusion      of left anterior descending, with otherwise nonobstructive disease      and patent diagonal stent site, treated medically. 3. Ischemic cardiomyopathy.     a.     Ejection fraction 30-35%, by transesophageal echocardiography,       this admission. 4. Gastroenteritis.  SECONDARY DIAGNOSES: 1. Status post embolic stroke, February 2012. 2. Status post gastrointestinal bleed, December 2011.     a.     Pradaxa discontinued. 3. Right bundle-branch block. 4. Hypertension. 5. Dyslipidemia. 6. Chronic systolic heart failure. 7. Chronic kidney disease. 8. Dyslipidemia.  REASON FOR ADMISSION:  The patient is a 77 year old male, with previously documented multivessel coronary artery disease, status post PCI of the diagonal branch in 2007, and paroxysmal atrial fibrillation, status post prior cardioversions.  He initially presented to the [facility] with complaint of nausea, vomiting, and diarrhea.  He denied any chest pain or shortness of breath.  He then developed tachy palpitations and noted increased heart rates in the 130- 140 bpm range, and presented to the emergency room, where he was found to be in atrial fibrillation at approximately 160 bpm.   Following stabilization, recommendation was to transfer directly to [facility] for further evaluation and management.  HOSPITAL COURSE:  The patient was maintained on intravenous heparin.   Serial cardiac markers were drawn, and consistent with non-STEMI with a peak troponin of 4.7, on admission.  Initial workup consisted of a nuclear imaging study, which yielded large anteroseptal/apical defect on resting perfusion images.  However, given the abnormal cardiac markers, plan to proceed with the stress portion was cancelled, and the patient was referred for cardiac catheterization.  The procedure was performed by Dr. 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  LV-gram was deferred.  Regarding the atrial fibrillation, recommendation was to proceed with TEE-guided cardioversion, successfully performed by Dr. Olga Millers on hospital day #2, with restoration of normal sinus rhythm.  There were no noted complications, and the patient was maintaining normal sinus rhythm, at the time of discharge.  Of note, there was no evidence of LAA thrombus.  Estimated ejection fraction 30-35%, with anteroapical/distal septal akinesis, and  no evidence of apical thrombus, as well.  The remainder of the patient's course was essentially benign. Medication adjustments were notable for up-titration of beta-blocker.  Regarding statins, the patient indicated that he had had adverse reaction to this in the past, and plan was to reassess this at time of outpatient followup.  Final recommendations, per Dr. Jens Som, were to reassess LV function with an echocardiogram in 3 months, and if EF is still less than, or equal to, 35%, to then consider ICD implantation.  DISCHARGE LABORATORY DATA:   Potassium 4.3, BUN 18, and creatinine 1.3. WBC 5.1, hemoglobin 11.6, and platelet 135.  OUTSTANDING LABORATORY DATA:  Peak CPK 63/48, on admission; peak troponin-I 4.3, on admission.  Lipid profile:  Total cholesterol 114, triglyceride 366, HDL 22, and LDL 19.  BUN 36, creatinine 1.7 on admission.  INR 1.7 on admission, 2.3 at discharge.  DISPOSITION:  Stable.  FOLLOWUP: 1. Dr. Andee Lineman in 2-4 weeks.  Arrangements to be made through our     office. 2. Followup [facility]R level in 1 week, in our [facility].  DISCHARGE MEDICATIONS: 1. Toprol-XL 50 mg daily. 2. Aspirin 81 mg daily. 3. Coumadin 5 mg as directed. 4. Lisinopril 20 mg daily. 5. Lasix 20 mg daily. 6. Lantus 25 units nightly. 7. Fish oil 1 tablet daily.  DURATION OF DISCHARGE ENCOUNTER:  Greater than 30 minutes, including physician time.     [name], PA-C   ______________________________ Madolyn Frieze. Jens Som, MD, [facility]    GS/MEDQ  D:  12/04/2010  T:  12/04/2010  Job:  [id number]  cc:   Selinda Flavin  Electronically Signed by Rozell Searing PA-C on 12/07/2010 09:13:58 PM Electronically Signed by Olga Millers MD [facility] on 12/08/2010 01:34:56 PM

## 2010-12-14 NOTE — Cardiovascular Report (Signed)
[name] [name], [name] [name]               ACCOUNT NO.:  [id number]  MEDICAL RECORD NO.:  [id number]           PATIENT TYPE:  I  LOCATION:  2921                         FACILITY:  [facility]  PHYSICIAN:  Bevelyn Buckles. Bensimhon, MDDATE OF BIRTH:  May 26, 1929  DATE OF PROCEDURE:  12/01/2010 DATE OF DISCHARGE:                           CARDIAC CATHETERIZATION   PRIMARY CARDIOLOGIST:  [name], MD, [facility]  PRIMARY CARE PHYSICIAN:  Dr. [name] at [facility].  PATIENT INDICATIONS:  [name] [name] is a very pleasant 77 year old man with known coronary artery disease and paroxysmal atrial fibrillation.  He underwent catheterization most recently in 2007, which showed a chronic total occlusion of his LAD, high-grade lesion in his diagonal, normal right coronary and small anomalous left circumflex.  He underwent successful stenting of the diagonal.  Dr. Juanda Chance did try to open the ostial portion of the anomalous left circ, which had high-grade disease, but this was unsuccessful despite prolonged efforts.  He was treated medically.  He was admitted with severe shortness of breath in the setting of recurrent atrial fibrillation.  He was initially scheduled for a stress test.  However, his troponin came back at 3.5, so we made a decision to proceed with diagnostic catheterization.  Every attempt was made to limit contrast given his previous history of contrast nephropathy.  PROCEDURES PERFORMED: 1. Selective coronary angiography. 2. Left heart catheterization. 3. StarClose femoral artery closure.  DESCRIPTION OF PROCEDURE:  The risks and indication were explained. Consent was signed and placed in the chart.  A 5-French sheath was placed in the right femoral artery using modified Seldinger technique. We used a JL-4, JR-4, and right coronary bypass graft __________ coronaries.  All catheter exchanges were made over a wire.  There are no apparent complications.  Left ventriculogram was not performed in  an effort to limit contrast exposure.  Total contrast used was 45 mL.  Central aortic pressure 116/66, mean of 88.  LV pressure 105/10 with EDP of 13.  There is no aortic stenosis.  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  LAD was totally occluded in the midsection after takeoff of the diagonal branches was chronic.  There was mild plaquing prior to this in the diagonal branch.  There was evidence of a previously placed stent in the ostium.  This was patent.  There was mild plaquing within the stent, but it filled well.  There is no high-grade stenosis.  Right coronary artery was a very large dominant vessel.  It gave off RV branch, a small acute marginal,  a large PDA, a large posterolateral, and small posterolateral.  There were minimal collaterals to the LAD. There was mild diffuse plaquing in the RCA of about 20%.  The anomalous left circumflex came off the right coronary cusp just below the right coronary artery.  We used an RCB catheter engage as this is a very tiny vessel with a long subtotal occlusion in the origin andhad TIMI I to II flow.  ASSESSMENT: 1. Coronary artery disease with chronic total occlusion of the left     anterior descending. 2. Previously placed diagonal stent is patent. 3. The right coronary  artery has minimal plaque. 4. 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  This is slightly worse than previous, but not amenable     to percutaneous coronary intervention. 5. Atrial fibrillation with rapid ventricular response, not responding     to diltiazem.  PLAN/DISCUSSION:  I suspect [name] [name]'s are symptoms mostly related to his atrial fibrillation with rapid ventricular response.  We will resume heparin and Coumadin 8 hours after the cath.  We will plan TEE cardioversion in the morning.  Consideration can be given to restarting amiodarone, which he was in 2008, stopped for unclear reasons.  We will follow up with Dr.  Andee Lineman.     Bevelyn Buckles. Bensimhon, MD     DRB/MEDQ  D:  12/01/2010  T:  12/02/2010  Job:  [id number]  cc:   Dr. [name]  Electronically Signed by Arvilla Meres MD on 12/14/2010 07:17:19 PM

## 2010-12-17 ENCOUNTER — Ambulatory Visit (INDEPENDENT_AMBULATORY_CARE_PROVIDER_SITE_OTHER): Payer: Medicare Other | Admitting: *Deleted

## 2010-12-17 DIAGNOSIS — I4892 Unspecified atrial flutter: Secondary | ICD-10-CM

## 2010-12-17 DIAGNOSIS — Z7901 Long term (current) use of anticoagulants: Secondary | ICD-10-CM

## 2010-12-17 DIAGNOSIS — I4891 Unspecified atrial fibrillation: Secondary | ICD-10-CM

## 2010-12-17 DIAGNOSIS — I6350 Cerebral infarction due to unspecified occlusion or stenosis of unspecified cerebral artery: Secondary | ICD-10-CM

## 2010-12-24 ENCOUNTER — Ambulatory Visit (INDEPENDENT_AMBULATORY_CARE_PROVIDER_SITE_OTHER): Payer: Medicare Other | Admitting: *Deleted

## 2010-12-24 DIAGNOSIS — Z7901 Long term (current) use of anticoagulants: Secondary | ICD-10-CM

## 2010-12-24 DIAGNOSIS — I6350 Cerebral infarction due to unspecified occlusion or stenosis of unspecified cerebral artery: Secondary | ICD-10-CM

## 2010-12-24 DIAGNOSIS — I4892 Unspecified atrial flutter: Secondary | ICD-10-CM

## 2010-12-24 DIAGNOSIS — I4891 Unspecified atrial fibrillation: Secondary | ICD-10-CM

## 2010-12-28 NOTE — Assessment & Plan Note (Signed)
[facility]                          [facility]ICE NOTE   [name], [name]                 MRN:          [id number]  DATE:11/05/2007                            DOB:          1929-05-06    PRIMARY CARDIOLOGIST:  [name] Codding, MD,FACC   REASON FOR VISIT:  Scheduled 5-month follow-up.   HISTORY OF PRESENT ILLNESS:  The patient returns to our clinic for  scheduled follow-up, since last seen here in September 2008.  Since  then, he has had a significant adjustment of his medication regimen,  with cessation of Coumadin and amiodarone.  The patient was found to  have microcytic anemia and, following a discussion between Dr. Dimas [name]  and Dr. Andee [name], was taken off the Coumadin.  A follow-up CBC showed  improvement in hemoglobin from 9.1-10.6.  His ferritin also improved  from a low level of 16 to a recent level of 36.  Stools were heme  negative. The patient remains on low-dose aspirin. He apparently has not  had a recent upper endoscopy or colonoscopy, but had a normal  colonoscopy in 2005.   The patient also was taken off amiodarone, given his maintenance of NSR.   I also suggested starting Crestor when he was last seen here in the  clinic.  He then called [place] requesting a change to a cheaper alternative,  and we suggested simvastatin 40 q.h.s.  He apparently was unable to  tolerate this, and this was also discontinued by Dr. Dimas [name].  The  patient currently is not on a statin, as when I saw him last September.  He does have diabetes mellitus, of note.  We have no recent fasting  lipid profile.   Clinically, the patient reports no interim development of symptoms  suggestive of either unstable angina pectoris or congestive heart.  He  also denies any tachy palpitations.  Of note, the patient denies any  evidence of overt bleeding.   Electrocardiogram in our office today shows sinus bradycardia at 54 bpm  with left axis deviation and  chronic right bundle branch block.   CURRENT MEDICATIONS:  1. Aspirin 81 mg.  2. Toprol XL 50 of b.i.d.  3. Lantus 30 units q.h.s.  4. Lisinopril 10 daily.  5. Ferrous sulfate 325 b.i.d.   PHYSICAL EXAMINATION:  VITAL SIGNS:  Blood pressure 151/78, pulse 55,  regular weight 215 (up five).  GENERAL:  A 77 year old male sitting upright in no distress.  HEENT:  Normocephalic, atraumatic.  NECK:  Palpable bilateral pulses without bruits; no JVD.  LUNGS:  Clear to auscultation in all fields.  HEART:  Regular rate and rhythm (S1, S2), no significant murmurs.  No  rubs.  ABDOMEN:  Soft, nontender, intact bowel sounds.  EXTREMITIES:  Palpable pulses without edema.  NEUROLOGICAL:  No focal deficit.   IMPRESSION:  1. Multivessel coronary artery disease.      a.     Status post BMS of diagonal and CFX arteries, June 2007.      b.     Residual 100% LAD with distal collateralization.      c.  EF of 45% by 2-D echo, April 2007.      d.     Status post anterior MI/PCI in 1997.  2. Chronic systolic heart failure, current NYHA class I.  3. History of paroxysmal atrial fibrillation.      a.     Maintaining NSR.      b.     Previously on amiodarone and Coumadin.      c.     Status post DC cardioversion, May 2007.      d.     [place] II score:  3  4. Chronic renal insufficiency, history of contrast nephropathy.  5. Hypertension.  6. Dyslipidemia, intolerant to simvastatin.  7. Chronic right bundle branch block.   PLAN:  Following review with Dr. Andee [name], recommendations are as follows:  1. Given the patient's elevated [place] II score of 3, secondary to      hypertension, diabetes mellitus, and age, he has an increased risk      for stroke secondary to history of paroxysmal atrial fibrillation.      Therefore, we recommend that [name] [name] resume taking Coumadin, but      only after a GI source of bleeding has been ruled out for his      anemia.  In the interim, he is to remain on aspirin,  pending      further recommendations.  2. I did suggest up titrating his lisinopril for better blood pressure      control.  However, he was hesitant to do so today, but did agree      that he would confer with Dr. Dimas [name] regarding this issue, as well.  3. We also touched on the object of hyperlipidemia and the fact that      he, once again, is not on a statin.  He did indicate that he was      intolerant of simvastatin and could not afford Crestor.  I      suggested to him that he confer with Dr. Dimas [name] with respect to a      more suitable choice for treatment of his hyperlipidemia, but did      stress the importance of being on a lipid      lowering agent.  We will also request his most recent lipid profile      from Dr. Jeannette [name] office.  4. Schedule return clinic follow-up visit with myself and Dr. Andee [name]      in 6 months.      [name] Serpe, PA-C  Electronically Signed      [name] Codding, MD,FACC  Electronically Signed   GS/MedQ  DD: 11/05/2007  DT: 11/05/2007  Job #: [id number]   cc:   [name] [name]

## 2010-12-28 NOTE — Discharge Summary (Signed)
[name] [name], [name] [name]               ACCOUNT NO.:  [id number]   MEDICAL RECORD NO.:  [id number]           PATIENT TYPE:   LOCATION:                                 FACILITY:   PHYSICIAN:  [name], MD,FACC      DATE OF BIRTH:   DATE OF ADMISSION:  01/27/2006  DATE OF DISCHARGE:  02/05/2006                               DISCHARGE SUMMARY   FINAL DIAGNOSES:  1. Atrial fibrillation.  2. Ischemic heart disease.  3. Acute on chronic congestive heart failure.  4. Chronic kidney disease stage 3 status post contrast nephropathy.  5. Febrile illness.   HISTORY OF PRESENT ILLNESS:  The patient is a 77 year old male with  history of coronary artery disease and atrial fibrillation.  The patient  was admitted on 01/27/2006.  The patient had a previous admission for  uncontrolled atrial fibrillation.  Verapamil was added to his medical  regimen and then was seen back in the office at [facility].  The patient then  presented again with symptomatic atrial fibrillation, dyspnea and  __________ .  It was not clear whether the patient now presented with  ischemic heart disease.  He was transferred to [facility] with  a diagnosis of atrial fibrillation and dyspnea.  The patient has a  history of known coronary artery disease.  The plan was to proceed with  a cardiac catheterization.  The patient underwent on 01/30/2006, cardiac  catheterization by Dr. Juanda Chance.  The patient underwent PCI stent  placement with drug-eluting splints in diagonals.  The ejection fraction  was found to be 45%.  The patient's hospital course was complicated by a  contrast nephropathy which ultimately resolved with a discharge  creatinine of 1.9.  The patient also converted to normal sinus rhythm  and was placed on amiodarone.  The patient had a febrile illness during  this hospitalization but blood cultures were negative.  Ultimately, the  patient was discharged with resolution of his contrast nephropathy and  improvement in his dyspnea.   DISPOSITION:  1. The patient was discharged on amiodarone 400 mg a day.  2. No Digoxin, no verapamil, no lisinopril on discharge.  3. Aspirin and Plavix x30 days.  4. Coumadin was continued.  5. Avelox was discontinued.  6. The patient was given instructions to followup in [place] on Wednesday      [facility] at 10:00 a.m.  A followup laboratory work up      was scheduled to be drawn.  The patient also has a follow up      appointment in the [facility] Clinic.      [name], MD,FACC  Electronically Signed     GED/MEDQ  D:  05/01/2007  T:  05/02/2007  Job:  [id number]

## 2010-12-28 NOTE — Assessment & Plan Note (Signed)
[facility]                          [facility] NOTE   [name] [name], [name] [name]                 MRN:          [id number]  DATE:05/14/2007                            DOB:          1928-11-20    PRIMARY CARDIOLOGIST:  [name], MD,FACC.   REASON FOR VISIT:  Scheduled 3-month followup.   Since last seen here in the clinic in February 2008, the patient  continues to do well from a cardiovascular standpoint with no interim  development of signs/symptoms suggestive of unstable angina pectoris or  congestive heart failure.  The patient also has a history of PAF and is  maintaining normal sinus rhythm on amiodarone.  He is also on chronic  Coumadin.  He denies any tachy palpitations.   When the patient was last seen here in the clinic, he was referred for  surveillance blood work with respect to his chronic amiodarone therapy.  Liver enzymes were within normal limits.  TSH was also normal.  Most  recent lipid profile yielded total cholesterol 148, triglycerides 121,  HDL 28, and LDL 96.  The patient is currently not on a Statin.   CURRENT MEDICATIONS:  1. Coumadin 2.5 as directed.  2. Aspirin 81 daily.  3. Toprol XL 50 b.i.d.  4. Amiodarone 200 daily.  5. Lantus 30 units q.h.s.  6. Lisinopril 10 daily.   PHYSICAL EXAMINATION:  VITAL SIGNS:  Blood pressure 149/74, pulse 65  regular, weight 210.  GENERAL:  A 77 year old male sitting upright in no distress.  HEENT:  Normocephalic atraumatic.  NECK:  Palpable bilateral carotid pulses without bruits; no JVD at 90  degrees.  LUNGS:  Diminished breath sounds at the bases but without crackles or  wheezes.  HEART:  Regular rate and rhythm (S1 S2).  No significant murmurs.  ABDOMEN:  Benign.  EXTREMITIES:  Palpable bilateral femoral pulses without bruits; palpable  bilateral popliteal pulses; palpable bilateral posterior tibialis  pulses.  NEUROLOGIC:  No focal deficit.   IMPRESSION:  1. Multi-vessel coronary artery disease.      a.     Status post bare metal stenting diagonal and circumflex       arteries, June 2007.      b.     Residual 100% left anterior descending artery with distal       collateralization.      c.     Ejection fraction 40%, by cardiac catheterization.      d.     Status post anterior wall myocardial infarction treated with       percutaneous coronary intervention in 1997.  2. Chronic systolic heart failure.      a.     Currently euvolemic.  3. History of paroxysmal atrial fibrillation.      a.     Maintaining normal sinus rhythm on amiodarone.      b.     Status post D/C cardioversion, May 2007.  4. Chronic Coumadin.  5. Chronic renal insufficiency.      a.     History of contrast nephropathy.  6. Chronic right bundle branch block.  7. Hypertension.  8. Dyslipidemia.   PLAN:  1. Schedule surveillance 2-view chest x-ray for continued monitoring      on amiodarone.  2. Initiate lipid-lowering management with Crestor 10 mg daily.  The      patient will need followup fasting lipids, liver profile in 12      weeks.  3. Schedule return clinic followup with myself and Dr. Andee Lineman in 3      months.      [name], PA-C  Electronically Signed      [name], MD,FACC  Electronically Signed   GS/MedQ  DD: 05/14/2007  DT: 05/14/2007  Job #: [id number]   cc:   Selinda Flavin

## 2010-12-31 ENCOUNTER — Encounter: Payer: Medicare Other | Admitting: *Deleted

## 2010-12-31 NOTE — Cardiovascular Report (Signed)
[name] [name], [name] [name]               ACCOUNT NO.:  [id number]   MEDICAL RECORD NO.:  [id number]          PATIENT TYPE:  INP   LOCATION:  2029                         FACILITY:  [facility]   PHYSICIAN:  Charlton Haws, M.D.     DATE OF BIRTH:  28-Jun-1929   DATE OF PROCEDURE:  01/30/2006  DATE OF DISCHARGE:                              CARDIAC CATHETERIZATION   INDICATIONS:  Nausea, chest pain, bradycardia.   Patient has acute on chronic renal insufficiency and was hyperkalemic on  admission.  He is status post anterior wall MI with an angioplasty in 1996.   Cine catheterization was done with 6-French catheters from right femoral  artery.   FINDINGS:  1.  Left main coronary artery had a 20% tubular stenosis.  2.  I did not really see any significant circumflex vessel.  3.  The LAD was 100% occluded in the proximal portion.  There appeared to be      a large first intermediate branch or diagonal branch with an 80% ostial      lesion.  4.  The right coronary artery was dominant; and there was no significant      disease with prolonged injection in the RAO view.  There were      significant septal collaterals to the distal and mid-LAD.   RAO VENTRICULOGRAPHY:  RAO ventriculography showed anterior wall hypokinesis  with an EF in the 40% range.  There was no gradient across the aortic valve  and no MR.  Aortic pressure was 150/80, LV pressure was 146/14.   At the end of the case selective iliac injection showed this to be in good  position for deployment of the Angio-Seal device.  We had good hemostasis.   IMPRESSION:  1.  I will review the films with our interventionalist.  I suspect he would      be a candidate for balloon or stenting of the intermediate diagonal      branch.  2.  The LAD occlusion appears chronic with reasonable collaterals.  I do not      think that it would be an easy vessel to recannulate.  3.  I am not sure that the intermediate lesion is responsible for his first    hospital admission.  He appears to have gotten dehydrated with worsening      renal function and hyperkalemia causing his bradycardia.  4.  Because of his acute on chronic renal failure, I think, that it is      better to stage his intervention.  5.  We will hydrate him, lightly, post catheterization and follow his BMET      in the morning.           ______________________________  Charlton Haws, M.D.     PN/MEDQ  D:  01/30/2006  T:  01/30/2006  Job:  [id number]

## 2010-12-31 NOTE — Assessment & Plan Note (Signed)
[facility]                          [facility] NOTE   [name] [name], [name] [name]                 MRN:          [id number]  DATE:09/19/2006                            DOB:          30-Oct-1928    REFERRING PHYSICIAN:  Selinda Flavin   HISTORY OF PRESENT ILLNESS:  The patient is a 77 year old male with a  history of atrial fibrillation on amiodarone therapy for maintenance of  normal sinus rhythm. The patient also has a history of coronary artery  disease. He is status post __________ stent placement last year. The  patient stated he has been doing remarkably well. He now has joined an  exercise program and he goes to [employer] to work out. He does  this for about an hour. [facility] [employer] for 30  minutes. He brought in his blood pressure readings today and they are  well controlled. He denies any chest pain, shortness of breath,  orthopnea or PND. The patient is very active.   MEDICATIONS:  1. Aspirin 81 mg a day.  2. Coumadin 2.5 mg as directed.  3. Toprol XL 50 mg p.o. b.i.d.  4. Amiodarone 200 mg a day.  5. Lantus insulin 32 units q.h.s.   PHYSICAL EXAMINATION:  VITAL SIGNS:  Blood pressure 130/70, heart rate  64, weight is 210 pounds.  NECK:  Normal carotid upstrokes and no carotid bruits.  LUNGS:  Clear breath sounds bilaterally.  HEART:  Regular rate and rhythm, normal S1, S2. No murmurs, rubs or  gallops.  ABDOMEN:  Soft, nontender, no rebound or guarding. Good bowel sounds.   A 12-lead electrocardiogram normal sinus rhythm with __________ bundle  branch block. An old intraseptal wall MI.   PROBLEM LIST:  1. Paroxysmal atrial fibrillation with normal sinus rhythm.      a.     Status post cardioversion Jan 11, 2006.      b.     Amiodarone drug therapy.  2. Dyspnea stable. NYHA class 2.      a.     LV dysfunction, ejection fraction 45%.      b.     Ischemic heart disease status post  catheterization with       __________  stent placement.  3. History of congestive heart failure secondary to #1.  4. History of anterior wall myocardial infarction status post      percutaneous coronary intervention.  5. History of hypertensive heart disease.  6. Right bundle branch block.  7. History of contrast nephropathy with baseline creatinine of 1.4.  8. Coumadin anticoagulation.   PLAN:  1. The patient is stable from a cardiovascular perspective. He can be      continued on aspirin and Coumadin.  2. The patient will have laboratory work done as part of his      amiodarone drug toxicity monitoring.  3. Fasting lipid panel will be done also as the patient is currently      not on statin drug therapy and it is planning to be initiated.     [name], MD,FACC  Electronically  Signed    GED/MedQ  DD: 09/19/2006  DT: 09/19/2006  Job #: [id number]   cc:   Selinda Flavin

## 2010-12-31 NOTE — Op Note (Signed)
NAME:  [name] [name], [name] [name] NO.:  [id number]   MEDICAL RECORD NO.:  [id number]          PATIENT TYPE:  INP   LOCATION:  3004                         FACILITY:  [facility]   PHYSICIAN:  Cristi Loron, M.D.DATE OF BIRTH:  May 26, 1929   DATE OF PROCEDURE:  DATE OF DISCHARGE:                                 OPERATIVE REPORT   BRIEF HISTORY:  The patient is a 77 year old white male who has suffered  from back and right leg pain.  He failed medical management and was worked  up with a lumbar MI which demonstrated that the patient had a ruptured disk  at L4-5 on the right.  The patient had severe weakness in his right  quadriceps consistent with a right L4 radiculopathy. I discussed the various  treatments with the patient including surgery.  The patient weighed, the  risks, benefits, and alternatives of surgery, and decided to proceed with a  right L4-5 microdiskectomy.   PREOPERATIVE DIAGNOSES:  1.  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  2.  Lumbar radiculopathy.  3.  Lumbago.   POSTOPERATIVE DIAGNOSES:  1.  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  2.  Lumbar radiculopathy.  3.  Lumbago.   PROCEDURE:  Right L4-5 microdiskectomy using microdissection.   SURGEON:  Cristi Loron, M.D.   ASSISTANT:  Hilda Lias, M.D.   ANESTHESIA:  General endotracheal.   ESTIMATED BLOOD LOSS:  Minimal.   SPECIMENS:  None.   DRAINS:  None.   COMPLICATIONS:  None.   DESCRIPTION OF PROCEDURE:  The patient was brought to the operating room by  the anesthesia team.  General endotracheal anesthesia was induced.  The  patient was then carefully turned to the prone position on the Wilson frame.  His lumbosacral region was then prepared with Betadine scrub and Betadine  solution and sterile dressing were applied.  I then injected air into the  midsides with Marcaine and epinephrine solution using a scalpel to make a  linear midline incision over the L4-5 interspace using  electrocautery to  perform a right-sided subperiosteal dissection exposing the right spinous  process and lamina of L4 and L5.  I then obtained an intraoperative  radiograph to confirm our location.   We inserted a McCall retractor for exposure and then brought the operative  microscope into the field and under magnification and illumination completed  the microdissection/decompression.  We used a high-speed drill to perform a  right L4 laminotomy.  We completed the right L4 hemilaminectomy using the  Kerrison punch and removed the ligamenta flava in L3-4 and L4-5 giving [place] a  wide exposure of the thecal sac.  We then used microdissection to free up  the thecal sac and the L4 and L5 nerve roots from the epidural tissue.  We  used a Kerrison punch to remove some excess ligamenta flava from the lateral  recess, further decompressing the thecal sac.  Dr. Jeral Fruit then carefully  retracted the thecal sac medially with a d'erico retractor and we  encountered a large free-fragment herniated disk which had migrated in a  cephalad direction and was compressing the  L4 nerve root as it entered into  the neural foramen.  We used microdissection to free up the disk herniation  and remove the multiple fragments using the pituitary forceps.  We then  inspected the intervertebral disk.  There was no large hole in the annulus  nor did there appear to be any impending herniations.  We, therefore, did  not enter into the intervertebral disk space.  We then obtained hemostasis  using bipolar electrocautery.   We copiously irrigated the wound with bacitracin solution and removed the  solution.  We then palpated along the retrosurface of the thecal sac along  the extra ala of the L4 and L5 nerve roots on the right and noted that they  were well decompressed.  We then removed the McCall retractor and then  reapproximated the patient's thoracolumbar fascia with interrupted #1 Vicryl  suture.  The subcutaneous  tissue with #2-0 Vicryl suture and the skin with  Steri-Strips and Benzoin.  The wound was then covered with bacitracin  ointment, a sterile dressing applied, and the drapes were removed.  The  patient was subsequently returned to the supine position where he was  extubated by the anesthesia team and transported to the postanesthesia care  unit in stable condition.  All sponge, instrument, and needle counts were  correct at the end of the case.      Cristi Loron, M.D.  Electronically Signed     JDJ/MEDQ  D:  05/30/2005  T:  05/30/2005  Job:  [id number]

## 2010-12-31 NOTE — H&P (Signed)
[name] [name], [name] [name]               ACCOUNT NO.:  [id number]   MEDICAL RECORD NO.:  [id number]          PATIENT TYPE:  OUT   LOCATION:  CARD                         FACILITY:  [facility]   PHYSICIAN:  [name], M.D. LHCDATE OF BIRTH:  [birth year]   DATE OF ADMISSION:  07/26/2005  DATE OF DISCHARGE:  07/26/2005                                HISTORY & PHYSICAL   REFERRING PHYSICIAN:  Selinda Flavin, M.D.   REASON FOR ADMISSION:  Dyspnea on minimal exertion, patient with known  coronary artery disease, NYHA class III.   HISTORY OF PRESENT ILLNESS:  The patient is a 77 year old male with a  history of coronary artery disease and atrial fibrillation.  The patient had  a recent cardioversion, due to difficult-to-control heart rates, requiring  the addition of verapamil, in addition to a high-dose treatment plan with  Beta-blockers and digoxin.  Eventually, he was cardioverted on Jan 11, 2006,  with restoration of normal sinus rhythm.  However, the patient did not  maintain a sinus rhythm, but for several days.  The patient was readmitted  to [facility] on January 23, 2006, when he stated that he was feeling  significant shortness of breath and dyspnic, associated with weakness and  fatigue.  The patient also had lost a significant amount of weight and his  blood sugars were running quite high.  His wife called Dr. [name] on January 23, 2006, and noted that his fingerstick blood sugars were running over 300.  The patient, at that time, was seen in the office by Dr. [name].  He was  found to be back in atrial fibrillation after his most recent cardioversion,  with heart rates up to 130 beats per minute.  The patient reported symptoms  of dry mouth, polyuria, polydipsia, as well as weakness and fatigue.  He  stated that over the 2 weeks prior to this, he had noticed some  palpitations, and at times, on minimal exertion, broke out in a sweat.  However, he noted no recurrence of sternal chest  pain.  He did have some  exertional dyspnea, but improved from previous.   Of note is that this patient initially presented in April of 2007, with  rapid atrial fibrillation, which was complicated by congestive heart  failure, ejection fraction was determined to be 45%, but his symptoms  improved dramatically after he was given some additional Lasix.  No further  ischemia work up was performed after he r/o for MI, and the patient had a  dobutamine echocardiogram in late 2006, which showed an apical scar, but no  ischemia.  Of note is that the patient is status post anterior wall  myocardial infarction in 1997.  He had a PCI at that time.  Following the  admission in April, the patient was subsequently scheduled for cardioversion  with details as outlined above.   On January 23, 2006, when the patient was admitted to the hospital, it was felt  that he had uncontrolled atrial fibrillation and verapamil was added to his  medical regimen.  He was subsequently discharged with rate control and  had a  follow-up appointment in the office today on January 27, 2006.  Today, he  reports that he is feeling significantly worse.  He states that he is now  short of breath on minimal exertion.  The patient's resting heart rate is  approximately 65 beats per minute.  I had the patient walk in our hall and  after walking 3 times down the hall, he became clammy and sweaty, and short  of breath.  His heart rate also did not increase significantly, and he  remained at approximately 75 beats per minute.  He denied any substernal  chest pain.  His wife is quite concerned about the patient.  She states that  he is unable to do hardly anything anymore at home.  Walking up a flight of  stairs causes significant dyspnea.  Today also, the patient is rather pale  looking in the office but denies substernal chest pain.   ALLERGIES:  PENICILLIN causes swelling .   PAST MEDICAL HISTORY:  See problems below .   SOCIAL  HISTORY:  The patient is married for a second time.  The patient  lives with his wife in the [place] area.  He likes driving his  motorcycle.  He does not smoke or drink alcohol.  The patient also raises  pygmy goats.   FAMILY HISTORY:  Mother died at age 66, father at age 52 from cerebral  hemorrhage.   MEDICATIONS:  Verapamil 180 mg p.o. q. day, Januvia 100 mg p.o. q. day,  aspirin 81 mg p.o. q. day, digoxin 250 mcg p.o. q. day, metformin 500 mg  p.o. b.i.d., Coumadin 5 mg p.o. as directed, lisinopril/hydrochlorothiazide  10/12.5 q. day, Toprol-XL 100 mg p.o. b.i.d., and glipizide 10 mg p.o. q.  day.   REVIEW OF SYSTEMS:  As per HPI.  No nausea or vomiting.  No fever or chills.  No abdominal pain.  No melena or hematochezia.  Positive for frequency, but  no dysuria.  Positive for polyuria and polydipsia and increased thirst.  The  patient previously reported palpitations, but none now.  He is NYHA class  III.   PHYSICAL EXAMINATION:  VITAL SIGNS:  Blood pressure is 98/62, heart rate is  65 beats per minute.  GENERAL:  A well-nourished white male, but pale appearing.  NECK:  Reveals a normal carotid upstroke and no carotid bruits.  No definite  JVD, approximately 5 cm.  LUNGS:  Clear.  There are no crackles on exam.  HEART:  Irregular rate and rhythm with normal S1, S2.  Of note is that after  the patient walked in the hall, he did have S3 on exam and a systolic  murmur.  ABDOMEN:  Soft, nontender.  No rebound or guarding.  Good bowel sounds.  EXTREMITIES:  Reveals no cyanosis, clubbing, or edema.  NEUROLOGICAL:  The patient is alert, oriented, and grossly nonfocal.   LABORATORY DATA:  Pending.  A 12-lead electrocardiogram is reviewed and  demonstrates atrial fibrillation with an old anterior wall myocardial  infarction and a right bundle branch block.  There is also a possible old inferior wall myocardial infarction.  A chest x-ray is pending.   PROBLEMS:  1.  Atrial  fibrillation, rate controlled.      1.  Status post cardioversion, Jan 11, 2006.      2.  Previously uncontrolled heart rate, requiring the addition of          verapamil to high-dose beta blocker therapy and digoxin.  3.  Possibly symptomatic, due to the addition of increased negative          inotropic effect in a setting of LV dysfunction +/- bradycardia      4.  Possible tachybrady syndrome  2.  History of dyspnea (worsened, NYHA class III).      1.  LV dysfunction, ejection fraction of 45%, secondary to ischemic          cardiomyopathy.      2.  Negative ischemia work up in 2006.      3.  Rule out angina equivalent.  3.  Weakness and fatigue.      1.  Rule out secondary to #1.      2.  Rule out secondary to #2.      3.  Secondary to possible hypotension (see blood pressure above).  4.  No definite clinical evidence of congestive heart failure/volume      overload today.  5.  Coronary artery disease.      1.  Status post anterior wall myocardial infarction in 1997, status post          PCI.      2.  Status post dobutamine echocardiogram in 2006, with an apical scar,          but no ischemia.  6.  History of hypertensive heart disease.  7.  Right bundle branch block (chronic).  8.  Status post back surgery.  9.  Diabetes mellitus, uncontrolled.      1.  Requiring the recent addition of Januvia.      2.  Volume overload secondary to rosiglitazone.  10. Chronic renal insufficiency.  Creatinine 1.4.  11. Rule out obstructive sleep apnea.   PLAN:  1.  The patient was recently admitted for uncontrolled atrial fibrillation.      We added verapamil to his medical regimen.  I had the patient come back      in the office today for follow up.  However, he has done very poorly      with the addition of this drug therapy, despite the fact that his rate      is now controlled.  2.  The patient is clearly symptomatic and he is dyspneic and he is      currently an NYHA class III.  It  is not entirely clear if this is due to      excessive rate control of his atrial fibrillation, or whether this is      secondary to underlying ischemic heart disease, which had been some      concern during his last admission.  3.  After talking with the patient and his wife, I decided to admit the      patient at this point in time to further manage his atrial fibrillation,      but more importantly, to evaluate him for the possibility of underlying      ischemic heart disease.  Of note, is that this patient did present in      April with congestive heart failure in a setting of atrial fibrillation,      and despite the fact that he is now rate controlled, he remains      symptomatic with significant dyspnea on exertion.  It is well possible      that verapamil, as a negative inotrope, is causing some of his symptoms,      but I do not think this is the bottom line.  In addition, his blood     pressure is running low now, possibly due to his multidrug regimen for      atrial fibrillation.  4.  The patient is scheduled for a cardiac catheterization for Monday.  We      will hold his Coumadin.  If the patient does not have any additional      significant coronary lesions that will need to be intervened on, then I      do think we need to focus our attention on further management of his      atrial fibrillation.  This probably willrequire the discontinuation of      verapamil altogether, lowering his dose of beta blocker, and consider      the addition of amiodarone, followed by cardioversion. If in-hopsital      bradycardia is documented, the patient may need to be considered for      PPM.  The patient did improve symptomatically during the brief period      after his cardioversion, when he was in normal sinus rhythm.  It also      may be worthwhile to have Dr. Graciela Husbands provide a consultation when the      patient is admitted to [facility].      [name], M.D. [facility]   Electronically Signed     GED/MEDQ  D:  01/27/2006  T:  01/27/2006  Job:  [id number]   cc:   Selinda Flavin  Fax: [phone]

## 2010-12-31 NOTE — Assessment & Plan Note (Signed)
[facility]                            [facility] OFFICE NOTE   [name] [name], [name] [name]                 MRN:          [id number]  DATE:02/27/2006                            DOB:          1928-08-21    REFERRING PHYSICIAN:  Selinda Flavin.   HISTORY OF PRESENT ILLNESS:  The patient presents for follow-up visit.  Please see my dictated note of February 08, 2006, after his recent  hospitalization.  The patient states he has been doing extremely well.  He  has essentially no complaints.  He has no shortness of breath, orthopnea or  PND.  He has no chest pain.  His medications were reviewed previously.   PHYSICAL EXAMINATION:  VITAL SIGNS:  Blood pressure 100/52, heart rate 64  beats per minute, weight 213 pounds.  NECK:  Normal carotid upstroke.  No carotid bruits.  LUNGS:  Clear breath sounds bilaterally.  CARDIOVASCULAR:  Regular rate and rhythm, normal S1 and S2, no murmurs,  rubs, or gallops.  ABDOMEN:  Soft.  EXTREMITIES:  No clubbing, cyanosis, or edema.   A 12-lead electrocardiogram demonstrates normal sinus rhythm with no acute  ischemic changes or right bundle branch block and anteroseptal Q-waves.   PROBLEMS:  1.  Atrial fibrillation, now in normal sinus rhythm.      1.  Status post cardioversion Jan 11, 2006.      2.  On amiodarone drug therapy.  2.  History of dyspnea.      1.  Left ventricular dysfunction with ejection fraction 45%, status post          cardiac catheterization, status post recent Mini-Vision stent.  3.  Weakness and fatigue, resolved.  4.  History of congestive heart failure.  5.  History of anterior wall myocardial infarction in 1997, status post      percutaneous coronary intervention.  6.  History of hypertensive heart disease.  7.  Right bundle branch block.  8.  Status post contrast nephropathy, resolved.  9.  Anticoagulation.  10. Chronic renal insufficiency, creatinine at 1.4.   PLAN:  1.  The patient,  from a cardiovascular standpoint, has significantly      improved.  2.  I asked the patient to stop his Plavix in one week.  3.  The patient can continue on Coumadin therapy.  Also cut his amiodarone      to 200 mg a day.                                   [name], MD, [facility]   GED/MedQ  DD:  02/27/2006  DT:  02/27/2006  Job #:  [id number]   cc:   Selinda Flavin

## 2010-12-31 NOTE — Consult Note (Signed)
NAME:  NICKI, GRACY               ACCOUNT NO.:  [id number]   MEDICAL RECORD NO.:  [id number]          PATIENT TYPE:  INP   LOCATION:  6529                         FACILITY:  [facility]   PHYSICIAN:  Aram Beecham B. Eliott Nine, M.D.DATE OF BIRTH:  1928-09-30   DATE OF CONSULTATION:  02/02/2006  DATE OF DISCHARGE:                                   CONSULTATION   We are asked to see this patient because of acute renal failure.  This is a  77 year old gentleman followed by Dr. Selinda Flavin in [place] (primary care)  who has coronary artery disease, history of atrial fibrillation,  uncontrolled diabetes and longstanding hypertension.  He was admitted with  severe exertional dyspnea with known prior CAD.  His blood pressure on  admission was 85/62 and heart rate in the 60s and creatinine was 2.1 and he  was receiving verapamil, lisinopril, HCT, Toprol XL 100 twice a day.  His  baseline creatinine was 1.4.  He was also noted to have a potassium of 7 on  admission treated with Kayexalate.   His ACE inhibitor was discontinued and metformin which he was taking for his  diabetes was held.  His blood pressure improved into the normal range, a  creatinine dropped to 1.4.   He had a heart cath on January 30, 2006 and received 100 mL of contrast.  The  lesion was found which was felt to be possibly amendable to angioplasty so  he returned to the cath lab on January 31, 2006 for percutaneous intervention.  He received 650 mL of contrast.  Creatinine the next day was 1.9 and today  was up to 3.4.  He did receive IV fluid hydration.  Mucomyst was given after  the fact (ordered today as best I can tell).  His urine output on the night  shift was 1150 mL, 275 mL so far today but has not voided in about 8 hours.   PAST MEDICAL HISTORY:  1.  Diabetes type 2 for at least 23 years with peripheral neuropathy but no      known retinopathy.  2.  Hypertension for 23+ years with fair control.  3.  History of coronary artery  disease with prior anterior wall MI in 1997      with PCI at that time; cardiac cath on January 30, 2006 showing 100% LAD,      80% ostial diagonal versus first intermediate branch and an EF of 40%      status post PCI on January 31, 2006.  4.  History of paroxysmal atrial fibrillation with prior cardioversion,      possible tachybrady syndrome.  5.  History of lumbar spine surgery.  6.  CKD with baseline creatinine 1.4 (no prior workup that the patient knows      of).   MEDICATIONS:  He is currently receiving Mucomyst 600 mg b.i.d. started  today, amiodarone 400 milligrams b.i.d., aspirin, Plavix, digoxin 0.125 mg a  day, Lantus insulin 20 units a day and Toprol 50 mg b.i.d.  Januvia has been  held as have Norvasc and Glucotrol.  He is receiving  warfarin per pharmacy  protocol.   ALLERGIES:  He is allergic to PENICILLIN which causes swelling.   FAMILY HISTORY:  Strongly positive for hypertension.  His father died at age  52 of a cerebral hemorrhage related to hypertension and he has two brothers  and a sister with high blood pressure.  He has one sister who has had a  pacemaker and a daughter who is diabetic.   SOCIAL HISTORY:  Patient is married to his second wife.  He lives in  [place].  He does not use alcohol or tobacco.  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  He enjoys do an all sorts  of things including riding motorcycles, raising [place].  He does work  for his church.  His daughter says he can build about anything and he enjoys  doing mechanic-type work.   REVIEW OF SYSTEMS:  Today is positive for the onset of dyspnea at rest and  with talking.  He has had no urine output since earlier this morning and no  urge to void.  He has had no chest pain.  He has had some intermittent  nausea.  He has had no groin pain.  He noted swelling of his legs during  this admission and also had swelling back in April.   PHYSICAL EXAM:  He is a  dyspneic-appearing gentleman who looks somewhat  younger than his stated age of 31.  Blood pressure is 130/60, temperature  97.4, O2 sat 91% on room air.  He had 6-7 cm of JVD.  There are no carotid  bruits.  He has crackles present 1/3 of the way up both lung fields  posteriorly.  Cardiac exam S1-S2, no S3.  Abdomen is nondistended.  Bowel  sounds are present.  There are no abdominal bruits.  Liver edge is at the  right costal margin and nontender.  His right groin shows no evidence for  hematoma.  There are no bruits in his femoral arteries.  He has 1-2+  pretibial edema bilaterally.  His dorsalis pedis pulses are 2+ and  symmetric.  There are no stigmata of atheroemboli.  Hemoglobin is 11.1 down  from 13.3 on admission.  Sodium 130, potassium 5.2, chloride 99, CO2 23, BUN  33, creatinine 3.4, glucose 223, platelets 131,000.  PT/INR 1.4, calcium  8.4.  Hemoglobin A1c 10.9.  Renal ultrasound is pending.   IMPRESSION:  A 77 year old gentleman with known coronary artery disease who  has experienced contrast-induced acute renal failure superimposed on stage  III chronic kidney disease.  His chronic kidney disease is most likely  related to diabetes and hypertension.  His creatinine has more than doubled  over a 48-hour period from 1.4-3.4 and he has become oliguric throughout the  day today.  He is clinically volume overloaded by exam.   RECOMMENDATIONS:  DC IV fluids, place Foley catheter, keep strict I&O  records and give Lasix 160 mg IV now.  Will get a chest x-ray to rule out  edema.  Check urine sodium, creatinine, protein and urinalysis (prior Lasix  we can get a urine sodium and creatinine-would expect his pheno to be low in  the setting of contrast).  Would obtain a renal ultrasound to look at kidney  size and symmetry and rule out the remote possibility of hydronephrosis. Hopefully, his urine output will pick up with Lasix and creatinine will  plateau and then start to improve.  He  may require hemodialysis  if there is  no improvement and he continues to have issues with dyspnea.   Thanks for asking [place] to see him.  Will follow closely with you.           ______________________________  Duke Salvia. Eliott Nine, M.D.     CBD/MEDQ  D:  02/02/2006  T:  02/03/2006  Job:  [id number]

## 2010-12-31 NOTE — Cardiovascular Report (Signed)
[name] [name], LAMOUNTAIN NO.:  [id number]   MEDICAL RECORD NO.:  [id number]          PATIENT TYPE:  INP   LOCATION:  2029                         FACILITY:  [facility]   PHYSICIAN:  Charlies Constable, M.D. [facility] DATE OF BIRTH:  Jan 18, 1929   DATE OF PROCEDURE:  01/31/2006  DATE OF DISCHARGE:                              CARDIAC CATHETERIZATION   CLINICAL HISTORY:  Mr. Wieneke is 77 years old and has chronic atrial  fibrillation, history of anterior wall myocardial infarction in 1997, and  ejection fraction of 45%.  He was admitted to the hospital with symptoms of  unstable angina and he was studied yesterday by Dr. Eden Emms and was found to  have a totally occluded LAD and a 90% stenosis in the first diagonal branch  off the LAD.  He was scheduled for intervention today.   PROCEDURE:  The procedure was followed by the right femoral artery using an  arterial sheath and 6-French catheters.  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  We then proceeded to angioplasty of  the first large diagonal branch.   The patient was given Angiomax bolus and infusion.  We used a CLS-4 6-French  guiding catheter with side holes.  We crossed the lesion with a Prowater  wire without difficulty.  We predilated with a 2.25 x 15 mm Maverick  performing two inflations up to 8 atmospheres for 30 seconds.  We then  deployed a 2.75 x 12 mm Mini Vision stent deploying this with one inflation  of 14 atmospheres for 30 seconds.  We then post dilated 3.25 x 8 mm Quantum  Maverick performing three inflations up to 18 atmospheres for 30 seconds.   We then elected to consider intervention on the anomalous circumflex artery.  This was a small vessel but we thought it potentially could become much  larger with better flow.  We tried  multiple guiding catheters and the best  guiding catheter we could find was a Environmental education officer catheter.  We  passed the Prowater wire into the vessel with some difficulty but were  unable to cross the lesion.  It appeared that we developed a  wire  dissection with the wire.  We then attempted to cross with a PT-2 light  support and with a whisper wire but were unable to get the wire across the  lesion.  We never could get a very good seating of the guiding catheter  which made entering the vessel with the wire difficult and crossing  difficult.  We used a total of 500 mL of contrast and decided not to pursue  this further since it appeared to be a small vessel and we were concerned  about contrast nephropathy.  Creatinine was 1.4 with a clearance of 70 mL.  The patient tolerated the procedure well and left the laboratory in  satisfactory condition.  Right femoral was closed AngioSeal.  We did have an  outage of power during the procedure which interrupted procedure for about  10-15 minutes.           ______________________________  Charlies Constable, M.D. [facility]     BB/MEDQ  D:  01/31/2006  T:  01/31/2006  Job:  [id number]   cc:   Selinda Flavin  Fax: [phone]

## 2011-01-04 ENCOUNTER — Encounter: Payer: Medicare Other | Admitting: *Deleted

## 2011-01-05 NOTE — H&P (Signed)
NAME:  [name], [name]        ACCOUNT NO.:  [id number]  MEDICAL RECORD NO.:  [id number]           PATIENT TYPE:  LOCATION:                                 FACILITY:  PHYSICIAN:  Bevelyn Buckles. [name], MD     DATE OF BIRTH:  DATE OF ADMISSION:  11/30/2010 DATE OF DISCHARGE:                             HISTORY & PHYSICAL   PRIMARY CARDIOLOGIST:  [name], MD,FACC.  PRIMARY CARE PROVIDER:  Dr. [name].  REASON FOR ADMISSION:  Atrial fibrillation with rapid ventricular response.  PAST MEDICAL HISTORY: 1. Paroxysmal atrial fibrillation, status post recurrent cardioversion     x2. 2. Hypertension. 3. Hyperlipidemia. 4. Chronic systolic heart failure, ejection fraction 40%-45% per     echocardiogram in August 2011. 5. Coronary artery disease, status post drug-eluting stent to the     diagonal in 2007. 6. Chronic renal insufficiency. 7. GI bleed in December 2011 with subsequent discontinuation of     Pradaxa. 8. Right bundle-branch block. 9. CVA in February 2012, suspected embolic.  REASON FOR HOSPITALIZATION:  This is a 77 year old Caucasian gentleman with the above-stated problem list, who states he is in his usual state of health until approximately 24 hours ago.  At this time, the patient states he began to feel nauseous, had diarrhea, as well as vomiting. Post episode of vomiting, the patient states he felt much better and was able to continue making cabinets.  Early this morning, the patient awoke to his shirt saturated with sweat.  He states this is not normal for him.  He also had leg cramps where he ate a spoon full of mustard and be subsided.  With the increased diaphoresis, the patient checked his blood pressure, which was systolic around 1:30, his heart rate was elevated and is 130s-140s.  The patient denies any chest pain or shortness of breath.  He also denies any recent fevers.  The patient denies any orthopnea, PND, or edema.  He does state he has  palpitations regularly in the morning when he awakes.  The patient presented to the [facility] emergency room.  He was found to be in atrial fibrillation with a heart rate of 160 beats per minute.  Again, no chest pain.  His initial troponin was noted to be 0.8 and therefore the patient was transferred to [facility] for further cardiac workup.  Again, the patient denies any chest pain.  As noted, his creatinine is 1.98 at this time.  His heart rate is under better control as he was given Cardizem 20 mg bolus and started on a 5 mg/hour Cardizem drip. His heart rate is running between 100 and 110.  His pressures are stable.  Cardiology was asked to admit the patient.  SOCIAL HISTORY:  The patient lives in [place] with his wife.  He has two daughters and two sons.  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  He has a history of tobacco abuse, but quit in 1971.  FAMILY HISTORY:  Noncontributory for early coronary artery disease, but does have a history of diabetes and hypertension.  ALLERGIES: 1. PENICILLIN. 2. PRADAXA.  HOME MEDICATIONS: 1. Metoprolol  tartrate 25 mg 1 tablet twice daily. 2. Furosemide 40 mg 1 tablet daily. 3. Coumadin 5 mg.  She is instructed per [facility].  REVIEW OF SYSTEMS:  All pertinent positives and negatives as stated in HPI.  All other systems had been reviewed and are negative.  PHYSICAL EXAMINATION:  VITAL SIGNS:  Pulse 106, respiration 14, blood pressure 127/83, O2 saturation 96% on 2 liters. GENERAL:  This is a well-developed, well-nourished elderly gentleman. He is in no acute distress. HEENT:  Normal. NECK:  Supple without bruit or JVD. HEART:  Irregularly irregular without murmur, rub, or gallop.  Pulses are 2+ and equal bilaterally. LUNGS:  Clear to auscultation bilaterally without wheezes, rales, or rhonchi. ABDOMEN:  Soft, nontender, obese, positive bowel sounds x4. EXTREMITIES:  No clubbing, cyanosis, or  edema. MUSCULOSKELETAL:  No joint deformities or effusions. NEURO:  Alert and oriented x3, cranial nerves II through XII grossly intact.  Chest x-ray showing no active cardiopulmonary abnormalities with bibasilar scarring.  EKG initially showing atrial fibrillation of 141 beats per minute with a right bundle-branch block, without acute changes.  Followup EKG showing atrial fibrillation with a right bundle- branch block and a rate of 126 beats per minute.  LABS:  WBC is 6.9, hemoglobin 13.2, hematocrit 38.2, platelets 118. Sodium 137, potassium 4, chloride 101, bicarb 25, BUN 14, creatinine 1.98, TSH 1.3, hemoglobin A1c 7, CK 534, CK-MB 32.4, troponin 0.82.  ASSESSMENT AND PLAN:  This is a 77 year old gentleman with history of paroxysmal atrial fibrillation, who has previously been on amiodarone, but this was stopped in June 2008.  The patient also has known coronary artery disease.  He initially had signs of gastroenteritis, has resolved approximately 24-48 hours ago.  Today, he experienced palpitations and diaphoresis and found himself to have an elevated heart rate. In the emergency department, he was found to be in atrial fibrillation with heart rates up to 160.  He denies any chest pain.  His current troponin is 0.82 with a creatinine of 1.98.  The patient is very active at baseline and denies any ischemic symptoms.  It is noted that his resting heart rate is between 55 to 60 per home blood pressure machine as well as office note.  At this time, we suspect the patient's symptoms are due to paroxysmal atrial fibrillation with rapid ventricular response in the setting of gastroenteritis and mild demand ischemia.  Given history of contrast nephropathy with catheterization in 2007, we would favor treating the patient's atrial fibrillation with rate control.  He will also be continued on anticoagulation.  Of note, unclear why the patient's amiodarone was stopped, but the patient's  daughter states that he is never to be on this medication again.  At this time, we will check a Lexiscan Myoview in the morning and take the patient for catheterization only if he is a high risk.  We suspect that while the patient is on Cardizem drip, he will convert to normal sinus rhythm.  Further treatment will be dependent upon the above results.  We will cycle cardiac markers.     Leonette Monarch, PA-C   ______________________________ Bevelyn Buckles. Bryce Cheever, MD    NB/MEDQ  D:  11/30/2010  T:  12/01/2010  Job:  [id number]  Electronically Signed by Alen Blew P.A. on 12/15/2010 03:22:32 PM Electronically Signed by Arvilla Meres MD on 01/05/2011 09:34:08 PM

## 2011-01-11 ENCOUNTER — Ambulatory Visit (INDEPENDENT_AMBULATORY_CARE_PROVIDER_SITE_OTHER): Payer: Medicare Other | Admitting: *Deleted

## 2011-01-11 DIAGNOSIS — I6350 Cerebral infarction due to unspecified occlusion or stenosis of unspecified cerebral artery: Secondary | ICD-10-CM

## 2011-01-11 DIAGNOSIS — I4891 Unspecified atrial fibrillation: Secondary | ICD-10-CM

## 2011-01-11 DIAGNOSIS — I4892 Unspecified atrial flutter: Secondary | ICD-10-CM

## 2011-01-11 DIAGNOSIS — Z7901 Long term (current) use of anticoagulants: Secondary | ICD-10-CM

## 2011-01-11 LAB — POCT INR: INR: 1.7

## 2011-01-12 ENCOUNTER — Encounter: Payer: Self-pay | Admitting: *Deleted

## 2011-01-13 ENCOUNTER — Ambulatory Visit (INDEPENDENT_AMBULATORY_CARE_PROVIDER_SITE_OTHER): Payer: Medicare Other | Admitting: Cardiology

## 2011-01-13 ENCOUNTER — Encounter: Payer: Self-pay | Admitting: Cardiology

## 2011-01-13 VITALS — BP 125/74 | HR 62 | Ht 70.0 in | Wt 220.0 lb

## 2011-01-13 DIAGNOSIS — I5022 Chronic systolic (congestive) heart failure: Secondary | ICD-10-CM

## 2011-01-13 DIAGNOSIS — I4891 Unspecified atrial fibrillation: Secondary | ICD-10-CM

## 2011-01-13 DIAGNOSIS — I639 Cerebral infarction, unspecified: Secondary | ICD-10-CM

## 2011-01-13 DIAGNOSIS — I2510 Atherosclerotic heart disease of native coronary artery without angina pectoris: Secondary | ICD-10-CM

## 2011-01-13 DIAGNOSIS — I6350 Cerebral infarction due to unspecified occlusion or stenosis of unspecified cerebral artery: Secondary | ICD-10-CM

## 2011-01-13 MED ORDER — METOPROLOL TARTRATE 25 MG PO TABS: ORAL_TABLET | ORAL | Status: DC

## 2011-01-13 NOTE — Assessment & Plan Note (Signed)
The patient is on warfarin therapy. Patient could not tolerate the dabigatran and associated with significant bleeding.

## 2011-01-13 NOTE — Assessment & Plan Note (Signed)
No evidence of volume overload. I made no adjustments in his diuretic therapy. His ejection fraction remains below 35% we will also add spironolactone.

## 2011-01-13 NOTE — Assessment & Plan Note (Signed)
Status post recent cardioversion the patient remained in normal sinus rhythm. We will continue his current medical therapy.

## 2011-01-13 NOTE — Assessment & Plan Note (Signed)
The patient will need a followup echocardiogram in 3 months. He has an ischemic cardiomyopathy with 100% occluded LAD. If his ejection fraction remains low at 35% he will be referred to the EP service for consideration of ICD implantation.

## 2011-01-13 NOTE — Progress Notes (Signed)
HPI The patient is a 77 year old male, with   previously documented multivessel coronary artery disease, status post   PCI of the diagonal branch in 2007, and paroxysmal atrial fibrillation,   status post prior cardioversions.  He initially presented to the   [facility] Emergency Room with complaint of nausea, vomiting,   and diarrhea.  He denied any chest pain or shortness of breath.  He then   developed tachy palpitations and noted increased heart rates in the 130-   140 bpm range, and presented to the emergency room, where he was found   to be in atrial fibrillation at approximately 160 bpm.  Following   stabilization, recommendation was to transfer directly to [facility] for further evaluation and management. The patient underwent a successful cardioversion. He remained in normal sinus rhythm. A TEE was also performed showed an ejection fraction 30-35%. The patient has a prior history of embolic stroke of February 2012. Due to the GI bleeding  on dabigatran this was discontinued previously and he has been maintained on Coumadin. The patient presents for followup after his hospitalization 12/04/2010. He underwent multiple tests including a transvaginal echocardiogram and including a cardiac catheterization. He was found to have a chronically occluded LAD with otherwise nonobstructive CAD. His ejection fraction however was 30-35%. The patient does report some shortness of breath on exertion. He denies any chest pain. Has occasional palpitations early in the morning which are short lived.  Allergies  Allergen Reactions  . Dabigatran     REACTION: Bleeding-low HGB  . Penicillins     REACTION: swelling    Current Outpatient Prescriptions on File Prior to Visit  Medication Sig Dispense Refill  . aspirin 81 MG tablet Take 81 mg by mouth daily.        . fish oil-omega-3 fatty acids 1000 MG capsule Take 1 g by mouth daily.        . furosemide (LASIX) 20 MG tablet Take 1  tablet (20 mg total) by mouth daily.  30 tablet  6  . LANTUS 100 UNIT/ML injection Inject 25 Units into the skin at bedtime.      Marland Kitchen lisinopril (PRINIVIL,ZESTRIL) 20 MG tablet Take 1 tablet by mouth Daily.      . metoprolol (TOPROL-XL) 50 MG 24 hr tablet Take 1 tablet by mouth Daily.      Marland Kitchen NITROSTAT 0.4 MG SL tablet Place 1 tablet under the tongue every 5 (five) minutes x 3 doses as needed.      . warfarin (COUMADIN) 5 MG tablet Take by mouth as directed.          Past Medical History  Diagnosis Date  . Paroxysmal atrial fibrillation     s/p recurrent cardioversion x2  . Hypertension   . Hyperlipidemia   . CHF (congestive heart failure)     s/p drug eluting stent to the diagonal 2007  . Chronic kidney disease     chronic renal insufficiency  . GI bleed 07/2010    with subsequent discontinuation of Pradaxa  . Right bundle branch block   . CVA (cerebral vascular accident) 09/2010    suspected embolic  . Ejection fraction < 50%     40-45% on echocardiogram 08.17/11,apical septal and apical anterior lateral wall motion abnormality. Moderately decreased LV function.    No past surgical history on file.  Family History  Problem Relation Age of Onset  . Diabetes Other   . Hypertension Other     History  Social History  . Marital Status: Married    Spouse Name: N/A    Number of Children: N/A  . Years of Education: N/A   Occupational History  . Not on file.   Social History Main Topics  . Smoking status: Former Smoker -- 0.3 packs/day for 40 years    Types: Cigarettes    Quit date: 08/15/1969  . Smokeless tobacco: Never Used  . Alcohol Use: No  . Drug Use: Not on file  . Sexually Active: Not on file   Other Topics Concern  . Not on file   Social History Narrative  . No narrative on file   WJX:BJYNWGNFA positives as outlined above. The remainder of the 18  point review of systems is negative   PHYSICAL EXAM BP 125/74  Pulse 62  Ht 5\' 10"  (1.778 m)  Wt 220 lb  (99.791 kg)  BMI 31.57 kg/m2  General: Well-developed, well-nourished in no distress Head: Normocephalic and atraumatic Eyes:PERRLA/EOMI intact, conjunctiva and lids normal Ears: No deformity or lesions Mouth:normal dentition, normal posterior pharynx Neck: Supple, no JVD.  No masses, thyromegaly or abnormal cervical nodes Lungs: Normal breath sounds bilaterally without wheezing.  Normal percussion Cardiac: regular rate and rhythm with normal S1 and S2, no S3 or S4.  PMI is normal.  No pathological murmurs Abdomen: Normal bowel sounds, abdomen is soft and nontender without masses, organomegaly or hernias noted.  No hepatosplenomegaly MSK: Back normal, normal gait muscle strength and tone normal Vascular: Pulse is normal in all 4 extremities Extremities: No peripheral pitting edema Neurologic: Alert and oriented x 3 Skin: Intact without lesions or rashes Lymphatics: No significant adenopathy Psychologic: Normal affect   ECG: Normal sinus rhythm with right bundle branch block anteroseptal infarct pattern age undetermined.  ASSESSMENT AND PLAN

## 2011-01-13 NOTE — Patient Instructions (Signed)
   Lopressor (Metoprolol Tartrate) - may take one tab as needed for palpitations, no more than 2 in 24 hour time frame.    Echo in 3 months - reminder will be mailed to you. Your physician wants you to follow up in: 6 months.  You will receive a reminder letter in the mail one-two months in advance.  If you don't receive a letter, please call our office to schedule the follow up appointment

## 2011-01-25 ENCOUNTER — Ambulatory Visit (INDEPENDENT_AMBULATORY_CARE_PROVIDER_SITE_OTHER): Payer: Medicare Other | Admitting: *Deleted

## 2011-01-25 DIAGNOSIS — I6350 Cerebral infarction due to unspecified occlusion or stenosis of unspecified cerebral artery: Secondary | ICD-10-CM

## 2011-01-25 DIAGNOSIS — I4891 Unspecified atrial fibrillation: Secondary | ICD-10-CM

## 2011-01-25 DIAGNOSIS — Z7901 Long term (current) use of anticoagulants: Secondary | ICD-10-CM

## 2011-01-25 DIAGNOSIS — I4892 Unspecified atrial flutter: Secondary | ICD-10-CM

## 2011-01-25 LAB — POCT INR: INR: 3.2

## 2011-02-18 ENCOUNTER — Ambulatory Visit (INDEPENDENT_AMBULATORY_CARE_PROVIDER_SITE_OTHER): Payer: Medicare Other | Admitting: *Deleted

## 2011-02-18 DIAGNOSIS — I6350 Cerebral infarction due to unspecified occlusion or stenosis of unspecified cerebral artery: Secondary | ICD-10-CM

## 2011-02-18 DIAGNOSIS — I4891 Unspecified atrial fibrillation: Secondary | ICD-10-CM

## 2011-02-18 DIAGNOSIS — Z7901 Long term (current) use of anticoagulants: Secondary | ICD-10-CM

## 2011-02-18 DIAGNOSIS — I4892 Unspecified atrial flutter: Secondary | ICD-10-CM

## 2011-02-18 LAB — POCT INR: INR: 2.5

## 2011-03-18 ENCOUNTER — Ambulatory Visit (INDEPENDENT_AMBULATORY_CARE_PROVIDER_SITE_OTHER): Payer: Medicare Other | Admitting: *Deleted

## 2011-03-18 DIAGNOSIS — I4891 Unspecified atrial fibrillation: Secondary | ICD-10-CM

## 2011-03-18 DIAGNOSIS — I4892 Unspecified atrial flutter: Secondary | ICD-10-CM

## 2011-03-18 DIAGNOSIS — I6350 Cerebral infarction due to unspecified occlusion or stenosis of unspecified cerebral artery: Secondary | ICD-10-CM

## 2011-03-18 DIAGNOSIS — Z7901 Long term (current) use of anticoagulants: Secondary | ICD-10-CM

## 2011-03-18 LAB — POCT INR: INR: 1.9

## 2011-04-13 ENCOUNTER — Other Ambulatory Visit: Payer: Self-pay | Admitting: Cardiology

## 2011-04-13 ENCOUNTER — Other Ambulatory Visit (INDEPENDENT_AMBULATORY_CARE_PROVIDER_SITE_OTHER): Payer: Medicare Other | Admitting: *Deleted

## 2011-04-13 DIAGNOSIS — I4891 Unspecified atrial fibrillation: Secondary | ICD-10-CM

## 2011-04-13 DIAGNOSIS — I5022 Chronic systolic (congestive) heart failure: Secondary | ICD-10-CM

## 2011-04-15 ENCOUNTER — Ambulatory Visit (INDEPENDENT_AMBULATORY_CARE_PROVIDER_SITE_OTHER): Payer: Medicare Other | Admitting: *Deleted

## 2011-04-15 DIAGNOSIS — I4891 Unspecified atrial fibrillation: Secondary | ICD-10-CM

## 2011-04-15 DIAGNOSIS — Z7901 Long term (current) use of anticoagulants: Secondary | ICD-10-CM

## 2011-04-15 DIAGNOSIS — I4892 Unspecified atrial flutter: Secondary | ICD-10-CM

## 2011-04-15 DIAGNOSIS — I6350 Cerebral infarction due to unspecified occlusion or stenosis of unspecified cerebral artery: Secondary | ICD-10-CM

## 2011-04-15 LAB — POCT INR: INR: 2.5

## 2011-04-19 ENCOUNTER — Encounter: Payer: Self-pay | Admitting: Cardiovascular Disease

## 2011-04-20 ENCOUNTER — Ambulatory Visit: Payer: Medicare Other | Admitting: Physician Assistant

## 2011-04-20 ENCOUNTER — Encounter: Payer: Self-pay | Admitting: Cardiology

## 2011-05-04 ENCOUNTER — Telehealth: Payer: Self-pay | Admitting: *Deleted

## 2011-05-04 NOTE — Telephone Encounter (Signed)
Patient called with concerns about elevated heart rate.  Stated it got up to 100 the other day.  Was not sure when to take his Metoprolol 25mg .  Does take Metoprolol 50mg  daily.  Advised him that he was only supposed to take the 25mg  as needed for palpitaitons.  No more than 2 in 24 hour time frame.  States he going out of town for 3 days & not sure what to do.  No c/o any symptoms, states he feels good.  Wants to know if his heart rate should be within a specific target range.

## 2011-05-05 NOTE — Telephone Encounter (Signed)
Currently taking standing dose Toprol XL 50mg  Qdaily, add also PM dose of 25mg  PO qpm. Can still use PRN metoprolol as directed.

## 2011-05-10 NOTE — Telephone Encounter (Signed)
Patient notified yesterday morning.   Has OV with GD for 10/31.

## 2011-05-13 ENCOUNTER — Ambulatory Visit (INDEPENDENT_AMBULATORY_CARE_PROVIDER_SITE_OTHER): Payer: Medicare Other | Admitting: Cardiology

## 2011-05-13 ENCOUNTER — Ambulatory Visit (INDEPENDENT_AMBULATORY_CARE_PROVIDER_SITE_OTHER): Payer: Medicare Other | Admitting: *Deleted

## 2011-05-13 ENCOUNTER — Encounter: Payer: Self-pay | Admitting: *Deleted

## 2011-05-13 DIAGNOSIS — D649 Anemia, unspecified: Secondary | ICD-10-CM

## 2011-05-13 DIAGNOSIS — I4892 Unspecified atrial flutter: Secondary | ICD-10-CM

## 2011-05-13 DIAGNOSIS — Z7901 Long term (current) use of anticoagulants: Secondary | ICD-10-CM

## 2011-05-13 DIAGNOSIS — I6350 Cerebral infarction due to unspecified occlusion or stenosis of unspecified cerebral artery: Secondary | ICD-10-CM

## 2011-05-13 DIAGNOSIS — I2510 Atherosclerotic heart disease of native coronary artery without angina pectoris: Secondary | ICD-10-CM

## 2011-05-13 DIAGNOSIS — N189 Chronic kidney disease, unspecified: Secondary | ICD-10-CM

## 2011-05-13 DIAGNOSIS — I5022 Chronic systolic (congestive) heart failure: Secondary | ICD-10-CM

## 2011-05-13 DIAGNOSIS — Z79899 Other long term (current) drug therapy: Secondary | ICD-10-CM

## 2011-05-13 DIAGNOSIS — I4891 Unspecified atrial fibrillation: Secondary | ICD-10-CM

## 2011-05-13 DIAGNOSIS — I4510 Unspecified right bundle-branch block: Secondary | ICD-10-CM

## 2011-05-13 LAB — POCT INR: INR: 2.6

## 2011-05-13 MED ORDER — AMIODARONE HCL 400 MG PO TABS: 400.0000 mg | ORAL_TABLET | Freq: Every day | ORAL | Status: DC

## 2011-05-13 NOTE — Patient Instructions (Signed)
   Begin Amiodarone 400mg  daily Your physician recommends that you go to the [facility] for lab work today for BMET & TSH  If the results of your test are normal or stable, you will receive a letter.  If they are abnormal, the nurse will contact you by phone. Nurse visit for next Tuesday for EKG  Coumadin check also on Tuesday with Misty Stanley

## 2011-05-13 NOTE — Assessment & Plan Note (Signed)
Will need close followup his ejection fraction. The patient is in NYHA class II at baseline and ejection fraction is less or equal than 30% after several months in normal sinus rhythm he may need referral to the EP service for ICD implantation.

## 2011-05-13 NOTE — Progress Notes (Signed)
History of present illness: The patient is a 77 year old male with a history of multivessel coronary disease, status post PCI of the diagonal branch in 2007 and chronically occluded LAD, status post proximal atrial fibrillation requiring previous cardioversion. The patient has LV dysfunction with an ejection fraction of 30-35%. The patient is a prior history of embolic stroke in February 2012 when dabigatran was discontinued secondary to GI bleed. The patient also has significant GI symptoms associated with dabigatran. The patient at that time was also amiodarone but this was discontinued due to the presumed side effects of nausea although this was more likely related to dabigatran. The patient was recently seen in his primary care physician's office because of recurrent palpitations and shortness of breath. He was found to be in recurrent atrial fibrillation. Heart rate was 140 beats per minute by EKG but there were no acute ischemic changes. There was evidence of an old anterior infarct pattern with right bundle branch block. BNP level was 384 and CBC was within normal limits. Creatinine was 1.82 with a BUN of 41 and a troponin of 0.03. The patient has been referred for management of recurrent atrial fibrillation associated with symptoms of shortness of breath.  Allergies, social history and family history: Previously reviewed  Medications: See chart  Past medical history: See problem list below  Review of systems: Patient reports shortness of breath on exertion. No palpitations or syncope. No chest pain. No melena or hematochezia. No dysuria or frequency. No fever or chills.  Physical examination: Vital signs as listed below General: White male in no apparent distress HEENT: Pupils isocoric conjunctiva clear, normal carotid upstroke no carotid bruits, no thyromegaly. Neck is supple Lungs: Clear breath sounds bilaterally.  Heart: Irregular rate and rhythm with normal S1 and S2 no definite  pathological murmurs. Abdomen: Soft nontender no rebound or guarding Extremity exam: No cyanosis clubbing or edema Neuro: Patient is alert and oriented and grossly nonfocal Vascular exam: The cells pedis and posterior tibial pulses 2+ bilaterally Psychologic: Within normal limits  12 electrocardiogram: Atrial fibrillation, old anterior infarct and right bundle branch block. No acute ischemic changes

## 2011-05-13 NOTE — Assessment & Plan Note (Addendum)
We'll check TSH. Patient will be started on amiodarone 400 mg by mouth daily. He was subtherapeutic on Coumadin and we'll proceed with cardioversion after abruptly 4 weeks of therapeutic PT/INR's. After one month of amiodarone can be decreased to 200 mg by mouth daily. We'll schedule the patient subsequently for cardioversion. Patient will be seen for nurse visit next week. We may need to increase his beta blocker and Coumadin also will need to be adjusted.

## 2011-05-13 NOTE — Assessment & Plan Note (Signed)
No recurrent chest pain. In this setting of shortness of breath troponin was negative. He did have some volume overload and Lasix was increased by his primary care provider. I made no changes in the dosing regimen.

## 2011-05-17 ENCOUNTER — Ambulatory Visit (INDEPENDENT_AMBULATORY_CARE_PROVIDER_SITE_OTHER): Payer: Medicare Other | Admitting: *Deleted

## 2011-05-17 VITALS — BP 118/77 | HR 112

## 2011-05-17 DIAGNOSIS — I6350 Cerebral infarction due to unspecified occlusion or stenosis of unspecified cerebral artery: Secondary | ICD-10-CM

## 2011-05-17 DIAGNOSIS — I4891 Unspecified atrial fibrillation: Secondary | ICD-10-CM

## 2011-05-17 DIAGNOSIS — I4892 Unspecified atrial flutter: Secondary | ICD-10-CM

## 2011-05-17 DIAGNOSIS — Z7901 Long term (current) use of anticoagulants: Secondary | ICD-10-CM

## 2011-05-17 LAB — POCT INR: INR: 2.7

## 2011-05-18 ENCOUNTER — Encounter: Payer: Self-pay | Admitting: *Deleted

## 2011-05-18 NOTE — Progress Notes (Signed)
Please see EKG & note from daughter scanned in.

## 2011-05-19 ENCOUNTER — Encounter: Payer: Self-pay | Admitting: Cardiology

## 2011-05-20 ENCOUNTER — Encounter: Payer: Medicare Other | Admitting: *Deleted

## 2011-05-24 ENCOUNTER — Ambulatory Visit (INDEPENDENT_AMBULATORY_CARE_PROVIDER_SITE_OTHER): Payer: Medicare Other | Admitting: *Deleted

## 2011-05-24 ENCOUNTER — Telehealth: Payer: Self-pay | Admitting: *Deleted

## 2011-05-24 DIAGNOSIS — I4891 Unspecified atrial fibrillation: Secondary | ICD-10-CM

## 2011-05-24 NOTE — Telephone Encounter (Signed)
States you told him not to drive motorcycle, but could drive car.  Did not find this documented.  Wants to know when he can drive motorcycle again.  Has OV scheduled for 10/31.

## 2011-05-25 NOTE — Telephone Encounter (Signed)
Cannot drive motorcycle until DCCV and will discuss at that time. Local driving OK

## 2011-05-26 NOTE — Telephone Encounter (Signed)
No answer at home #

## 2011-05-31 NOTE — Telephone Encounter (Signed)
No answer at home.  Has coumadin check for 10/19.  Will notify then if unable to reach patient.

## 2011-06-03 ENCOUNTER — Ambulatory Visit (INDEPENDENT_AMBULATORY_CARE_PROVIDER_SITE_OTHER): Payer: Medicare Other | Admitting: *Deleted

## 2011-06-03 DIAGNOSIS — I4891 Unspecified atrial fibrillation: Secondary | ICD-10-CM

## 2011-06-03 DIAGNOSIS — Z7901 Long term (current) use of anticoagulants: Secondary | ICD-10-CM

## 2011-06-03 DIAGNOSIS — I6350 Cerebral infarction due to unspecified occlusion or stenosis of unspecified cerebral artery: Secondary | ICD-10-CM

## 2011-06-03 DIAGNOSIS — I4892 Unspecified atrial flutter: Secondary | ICD-10-CM

## 2011-06-10 ENCOUNTER — Ambulatory Visit (INDEPENDENT_AMBULATORY_CARE_PROVIDER_SITE_OTHER): Payer: Medicare Other | Admitting: *Deleted

## 2011-06-10 DIAGNOSIS — I6350 Cerebral infarction due to unspecified occlusion or stenosis of unspecified cerebral artery: Secondary | ICD-10-CM

## 2011-06-10 DIAGNOSIS — I4892 Unspecified atrial flutter: Secondary | ICD-10-CM

## 2011-06-10 DIAGNOSIS — Z7901 Long term (current) use of anticoagulants: Secondary | ICD-10-CM

## 2011-06-10 DIAGNOSIS — I4891 Unspecified atrial fibrillation: Secondary | ICD-10-CM

## 2011-06-15 ENCOUNTER — Encounter: Payer: Self-pay | Admitting: Cardiology

## 2011-06-15 ENCOUNTER — Encounter: Payer: Self-pay | Admitting: *Deleted

## 2011-06-15 ENCOUNTER — Ambulatory Visit (INDEPENDENT_AMBULATORY_CARE_PROVIDER_SITE_OTHER): Payer: Medicare Other | Admitting: Cardiology

## 2011-06-15 ENCOUNTER — Telehealth: Payer: Self-pay | Admitting: *Deleted

## 2011-06-15 VITALS — BP 124/77 | HR 101 | Ht 70.0 in | Wt 224.0 lb

## 2011-06-15 DIAGNOSIS — Z7901 Long term (current) use of anticoagulants: Secondary | ICD-10-CM

## 2011-06-15 DIAGNOSIS — Z01810 Encounter for preprocedural cardiovascular examination: Secondary | ICD-10-CM

## 2011-06-15 DIAGNOSIS — I4891 Unspecified atrial fibrillation: Secondary | ICD-10-CM

## 2011-06-15 DIAGNOSIS — N189 Chronic kidney disease, unspecified: Secondary | ICD-10-CM

## 2011-06-15 DIAGNOSIS — I2510 Atherosclerotic heart disease of native coronary artery without angina pectoris: Secondary | ICD-10-CM

## 2011-06-15 MED ORDER — AMIODARONE HCL 400 MG PO TABS: 200.0000 mg | ORAL_TABLET | Freq: Every day | ORAL | Status: DC

## 2011-06-15 NOTE — Assessment & Plan Note (Signed)
No recurrent chest pain. Continue current medical therapy 

## 2011-06-15 NOTE — Assessment & Plan Note (Signed)
Continue close monitoring in the future of renal insufficiency.

## 2011-06-15 NOTE — Telephone Encounter (Signed)
DCCV scheduled for 11/1 - Thursday at 12N.   Just now scheduled.   Thanks

## 2011-06-15 NOTE — Patient Instructions (Addendum)
   Decrease Amiodarone to 200mg  daily - may take 1/2 of the 400mg  tab  Cardioversion tomorrow Follow up per check out from procedure tomorrow

## 2011-06-15 NOTE — Assessment & Plan Note (Signed)
Decrease amiodarone to 200 mg a day and we'll schedule patient for cardioversion tomorrow.

## 2011-06-15 NOTE — Assessment & Plan Note (Addendum)
Some lower extremity edema related to his atrial fibrillation. Continue Lasix 20 twice a day but cut back to once a day after cardioversion. Continue lisinopril creatinine has been stable between 1.8 and 1.9.

## 2011-06-15 NOTE — Assessment & Plan Note (Signed)
PT/INR has been greater than 2 for the last 4 weeks. No bleeding complications have been noted

## 2011-06-15 NOTE — Progress Notes (Signed)
HPI: The patient is an 77 year old male with a history of personal atrial fibrillation and coronary artery disease status post prior intervention. Ejection fraction is 30-35%. He has a prior history of embolic stroke and is on anticoagulation but has not been able to tolerate dabigatran. He is in currently on Coumadin. He has been restarted on amiodarone in preparation for cardioversion. His EKG today continues to show atrial fibrillation with heart rate is controlled. Previously amiodarone had been discontinued because he thought this caused nausea but in retrospect this was likely secondary to dabigatran. His INR has been stable and above 2 for the last 4 weeks. The patient does state that after taking amiodarone he feels small amount of nausea shortly thereafter. He still reports some mild lower extremity edema but this has improved with Lasix. He reports no palpitations presyncope or syncope.  PMH: reviewed and listed in Problem List in Electronic Records (and see below)  Allergies/SH/FHX : available in Electronic Records for review. Dabigatran causes nausea. Patient is also allergic to penicillin  Medications: Current Outpatient Prescriptions on File Prior to Visit  Medication Sig Dispense Refill  . aspirin 81 MG tablet Take 81 mg by mouth daily.        . ferrous sulfate 325 (65 FE) MG tablet Take 325 mg by mouth daily with breakfast.        . fish oil-omega-3 fatty acids 1000 MG capsule Take 1 g by mouth daily.        . furosemide (LASIX) 20 MG tablet Take 20 mg by mouth 2 (two) times daily.       Marland Kitchen LANTUS 100 UNIT/ML injection Inject 25 Units into the skin at bedtime.      Marland Kitchen lisinopril (PRINIVIL,ZESTRIL) 20 MG tablet Take 1 tablet by mouth Daily.      . metoprolol (TOPROL-XL) 50 MG 24 hr tablet Take 50 mg by mouth 2 (two) times daily.       . metoprolol tartrate (LOPRESSOR) 25 MG tablet Take 25 mg by mouth 2 (two) times daily. May take one tab as needed for palpitations for rapid heart rate.   No more than 2/24 hour time frame.      Marland Kitchen NITROSTAT 0.4 MG SL tablet Place 1 tablet under the tongue every 5 (five) minutes x 3 doses as needed.      . warfarin (COUMADIN) 5 MG tablet Take by mouth as directed.          ROS: No nausea or vomiting. No fever or chills.No melena or hematochezia.No bleeding.No claudication  Physical Exam: BP 124/77  Pulse 101  Ht 5\' 10"  (1.778 m)  Wt 224 lb (101.606 kg)  BMI 32.14 kg/m2 General: Well-nourished white male in no apparent distress Neck: No carotid bruits Lungs: Clear breath sounds bilaterally Cardiac: Irregular rate and rhythm Vascular: Not examined Skin: Warm and dry  12lead ECG: Atrial fibrillation with right bundle branch block and old anterior wall myocardial infarction with Q waves in leads V1 through V3 Limited bedside ECHO:N/A

## 2011-06-15 NOTE — Telephone Encounter (Signed)
No precert required 

## 2011-06-16 DIAGNOSIS — I4891 Unspecified atrial fibrillation: Secondary | ICD-10-CM

## 2011-06-16 LAB — PROTIME-INR

## 2011-06-17 ENCOUNTER — Ambulatory Visit (INDEPENDENT_AMBULATORY_CARE_PROVIDER_SITE_OTHER): Payer: Medicare Other | Admitting: *Deleted

## 2011-06-17 DIAGNOSIS — I4892 Unspecified atrial flutter: Secondary | ICD-10-CM

## 2011-06-17 DIAGNOSIS — Z7901 Long term (current) use of anticoagulants: Secondary | ICD-10-CM

## 2011-06-17 DIAGNOSIS — I6350 Cerebral infarction due to unspecified occlusion or stenosis of unspecified cerebral artery: Secondary | ICD-10-CM

## 2011-06-17 DIAGNOSIS — I4891 Unspecified atrial fibrillation: Secondary | ICD-10-CM

## 2011-06-22 ENCOUNTER — Other Ambulatory Visit: Payer: Self-pay | Admitting: *Deleted

## 2011-06-22 MED ORDER — FUROSEMIDE 20 MG PO TABS: 20.0000 mg | ORAL_TABLET | Freq: Every day | ORAL | Status: DC

## 2011-06-22 MED ORDER — METOPROLOL SUCCINATE ER 50 MG PO TB24: 50.0000 mg | ORAL_TABLET | Freq: Every day | ORAL | Status: DC

## 2011-06-24 ENCOUNTER — Ambulatory Visit (INDEPENDENT_AMBULATORY_CARE_PROVIDER_SITE_OTHER): Payer: Medicare Other | Admitting: *Deleted

## 2011-06-24 DIAGNOSIS — I4892 Unspecified atrial flutter: Secondary | ICD-10-CM

## 2011-06-24 DIAGNOSIS — Z7901 Long term (current) use of anticoagulants: Secondary | ICD-10-CM

## 2011-06-24 DIAGNOSIS — I4891 Unspecified atrial fibrillation: Secondary | ICD-10-CM

## 2011-06-24 DIAGNOSIS — I6350 Cerebral infarction due to unspecified occlusion or stenosis of unspecified cerebral artery: Secondary | ICD-10-CM

## 2011-06-27 ENCOUNTER — Encounter: Payer: Self-pay | Admitting: Cardiology

## 2011-06-27 ENCOUNTER — Ambulatory Visit (INDEPENDENT_AMBULATORY_CARE_PROVIDER_SITE_OTHER): Payer: Medicare Other | Admitting: Cardiology

## 2011-06-27 VITALS — BP 134/72 | HR 60 | Ht 70.0 in | Wt 225.0 lb

## 2011-06-27 DIAGNOSIS — K922 Gastrointestinal hemorrhage, unspecified: Secondary | ICD-10-CM

## 2011-06-27 DIAGNOSIS — Z7901 Long term (current) use of anticoagulants: Secondary | ICD-10-CM

## 2011-06-27 DIAGNOSIS — I639 Cerebral infarction, unspecified: Secondary | ICD-10-CM

## 2011-06-27 DIAGNOSIS — I5022 Chronic systolic (congestive) heart failure: Secondary | ICD-10-CM

## 2011-06-27 DIAGNOSIS — I6350 Cerebral infarction due to unspecified occlusion or stenosis of unspecified cerebral artery: Secondary | ICD-10-CM

## 2011-06-27 DIAGNOSIS — I4891 Unspecified atrial fibrillation: Secondary | ICD-10-CM

## 2011-06-27 MED ORDER — AMIODARONE HCL 100 MG PO TABS: 100.0000 mg | ORAL_TABLET | Freq: Every day | ORAL | Status: DC

## 2011-06-27 NOTE — Assessment & Plan Note (Signed)
Tolerating warfarin without any GI bleeding. Hemoglobin stable. Followed in the Coumadin clinic

## 2011-06-27 NOTE — Assessment & Plan Note (Signed)
No heart failure symptoms. The shortness of breath. Will repeat echocardiogram in 6-8 weeks now that the patient is in normal sinus rhythm.

## 2011-06-27 NOTE — Progress Notes (Signed)
CC: Status post cardioversion  HPI: Patient is an 77 year old male status post cardioversion. The patient remains in normal sinus rhythm. Yet she is feeling significantly better after the procedure. He feels he has more energy and is less short of breath. He states "I have been rolling". He has also no further palpitations in the a.m. He is followed in the Coumadin clinic and his INR has remained stable post cardioversion.  PMH: reviewed and listed in Problem List in Electronic Records (and see below) Allergies  Allergen Reactions  . Dabigatran     REACTION: Bleeding-low HGB  . Penicillins     REACTION: swelling    Allergies/SH/FHX : available in Electronic Records for review Amiodarone causes nausea at high doses only GI bleeding and nausea on dabigatran  Medications: Current Outpatient Prescriptions  Medication Sig Dispense Refill  . aspirin 81 MG tablet Take 81 mg by mouth daily.        . ferrous sulfate 325 (65 FE) MG tablet Take 325 mg by mouth daily with breakfast.        . fish oil-omega-3 fatty acids 1000 MG capsule Take 1 g by mouth daily.        . furosemide (LASIX) 20 MG tablet Take 1 tablet (20 mg total) by mouth daily.      Marland Kitchen LANTUS 100 UNIT/ML injection Inject 25 Units into the skin at bedtime.      Marland Kitchen lisinopril (PRINIVIL,ZESTRIL) 20 MG tablet Take 1 tablet by mouth Daily.      . metoprolol (TOPROL-XL) 50 MG 24 hr tablet Take 25 mg by mouth daily.        Marland Kitchen NITROSTAT 0.4 MG SL tablet Place 1 tablet under the tongue every 5 (five) minutes x 3 doses as needed.      . warfarin (COUMADIN) 5 MG tablet Take by mouth as directed.        Marland Kitchen amiodarone (PACERONE) 100 MG tablet Take 1 tablet (100 mg total) by mouth daily.  90 tablet  3    ROS: No nausea or vomiting. No fever or chills.No melena or hematochezia.No bleeding.No claudication  Physical Exam: BP 134/72  Pulse 60  Ht 5\' 10"  (1.778 m)  Wt 225 lb (102.059 kg)  BMI 32.28 kg/m2 General: Well-nourished white male in no  apparent distress. Neck: Normal carotid upstroke no carotid bruits Lungs: Clear breath sounds bilaterally. No wheezing Cardiac: Regular rate and rhythm with normal S1-S2 and no murmur rubs or gallops Vascular: No edema. Normal peripheral pulses Skin: Warm and dry  12lead ECG: Limited bedside ECHO:N/A   Assessment and Plan

## 2011-06-27 NOTE — Assessment & Plan Note (Signed)
Patient remains in normal sinus rhythm. He is now on amiodarone 200 mg by mouth daily. Will decrease amiodarone to 100 mg by mouth daily as of next month.

## 2011-06-27 NOTE — Assessment & Plan Note (Signed)
Involuntary movement in her right hand. No recurrent symptoms of TIA or CVA.

## 2011-06-27 NOTE — Patient Instructions (Signed)
Your physician wants you to follow-up in: 3 months. You will receive a reminder letter in the mail one-two months in advance. If you don't receive a letter, please call our office to schedule the follow-up appointment. On July 16, 2011: decrease Amiodarone to 100 mg daily. You will need to start the new prescription for 100 mg tablets. You need your Coumadin checked (INR) 1 week after making this change with your Amiodarone.

## 2011-07-01 ENCOUNTER — Ambulatory Visit (INDEPENDENT_AMBULATORY_CARE_PROVIDER_SITE_OTHER): Payer: Medicare Other | Admitting: *Deleted

## 2011-07-01 DIAGNOSIS — I4892 Unspecified atrial flutter: Secondary | ICD-10-CM

## 2011-07-01 DIAGNOSIS — I6350 Cerebral infarction due to unspecified occlusion or stenosis of unspecified cerebral artery: Secondary | ICD-10-CM

## 2011-07-01 DIAGNOSIS — I4891 Unspecified atrial fibrillation: Secondary | ICD-10-CM

## 2011-07-01 DIAGNOSIS — Z7901 Long term (current) use of anticoagulants: Secondary | ICD-10-CM

## 2011-07-08 ENCOUNTER — Ambulatory Visit (INDEPENDENT_AMBULATORY_CARE_PROVIDER_SITE_OTHER): Payer: Medicare Other | Admitting: *Deleted

## 2011-07-08 DIAGNOSIS — Z7901 Long term (current) use of anticoagulants: Secondary | ICD-10-CM

## 2011-07-08 DIAGNOSIS — I4892 Unspecified atrial flutter: Secondary | ICD-10-CM

## 2011-07-08 DIAGNOSIS — I6350 Cerebral infarction due to unspecified occlusion or stenosis of unspecified cerebral artery: Secondary | ICD-10-CM

## 2011-07-08 DIAGNOSIS — I4891 Unspecified atrial fibrillation: Secondary | ICD-10-CM

## 2011-07-18 ENCOUNTER — Other Ambulatory Visit: Payer: Self-pay | Admitting: *Deleted

## 2011-07-18 MED ORDER — AMIODARONE HCL 100 MG PO TABS: 100.0000 mg | ORAL_TABLET | Freq: Every day | ORAL | Status: DC

## 2011-07-26 ENCOUNTER — Ambulatory Visit (INDEPENDENT_AMBULATORY_CARE_PROVIDER_SITE_OTHER): Payer: Medicare Other | Admitting: *Deleted

## 2011-07-26 DIAGNOSIS — I6350 Cerebral infarction due to unspecified occlusion or stenosis of unspecified cerebral artery: Secondary | ICD-10-CM

## 2011-07-26 DIAGNOSIS — I4892 Unspecified atrial flutter: Secondary | ICD-10-CM

## 2011-07-26 DIAGNOSIS — Z7901 Long term (current) use of anticoagulants: Secondary | ICD-10-CM

## 2011-07-26 DIAGNOSIS — I4891 Unspecified atrial fibrillation: Secondary | ICD-10-CM

## 2011-08-19 ENCOUNTER — Ambulatory Visit (INDEPENDENT_AMBULATORY_CARE_PROVIDER_SITE_OTHER): Payer: Medicare Other | Admitting: *Deleted

## 2011-08-19 DIAGNOSIS — Z7901 Long term (current) use of anticoagulants: Secondary | ICD-10-CM

## 2011-08-19 DIAGNOSIS — I4891 Unspecified atrial fibrillation: Secondary | ICD-10-CM

## 2011-08-19 DIAGNOSIS — I4892 Unspecified atrial flutter: Secondary | ICD-10-CM

## 2011-08-19 DIAGNOSIS — I6350 Cerebral infarction due to unspecified occlusion or stenosis of unspecified cerebral artery: Secondary | ICD-10-CM

## 2011-08-19 LAB — POCT INR: INR: 2

## 2011-09-13 ENCOUNTER — Encounter: Payer: Medicare Other | Admitting: *Deleted

## 2011-09-20 ENCOUNTER — Ambulatory Visit (INDEPENDENT_AMBULATORY_CARE_PROVIDER_SITE_OTHER): Payer: Medicare Other | Admitting: *Deleted

## 2011-09-20 DIAGNOSIS — I4892 Unspecified atrial flutter: Secondary | ICD-10-CM

## 2011-09-20 DIAGNOSIS — I4891 Unspecified atrial fibrillation: Secondary | ICD-10-CM

## 2011-09-20 DIAGNOSIS — Z7901 Long term (current) use of anticoagulants: Secondary | ICD-10-CM

## 2011-09-20 DIAGNOSIS — I6350 Cerebral infarction due to unspecified occlusion or stenosis of unspecified cerebral artery: Secondary | ICD-10-CM

## 2011-10-14 ENCOUNTER — Telehealth: Payer: Self-pay | Admitting: *Deleted

## 2011-10-14 NOTE — Telephone Encounter (Signed)
Pt's wife, [name], left message on nurse's voicemail asking for a return call. She states the pt is just not feeling right today and would like to s/w a nurse.  Spoke with pt who states he is having a hard time sleeping. He states his BP/HR has been going up. Today his BP was 150/90-something. His HR was 88. He states when he moves his head on his right side along his neck it feels like a sore place. He doesn't have any pain but it's just sore. He also feels weak and fatigues more easily.   Offered pt appt with Dr. Earnestine Leys on March 22nd. He states he cannot wait this long. Recommended pt see Dr. [name] in the interim or go to ER if pain is severe. Pt asked if I would call Dr. [name] for him because when he calls he can never get through to speak with someone and usually another doctor's office can get through sooner. Explained to pt that I was currently in clinic with another physician and we had 1 nurse out today and it would be later today before I would be able to call. Pt hung up without saying anything further.

## 2011-10-14 NOTE — Telephone Encounter (Signed)
Message noted. Thanks for appropriately handling this.

## 2011-10-18 ENCOUNTER — Ambulatory Visit (INDEPENDENT_AMBULATORY_CARE_PROVIDER_SITE_OTHER): Payer: Medicare Other | Admitting: *Deleted

## 2011-10-18 DIAGNOSIS — I6350 Cerebral infarction due to unspecified occlusion or stenosis of unspecified cerebral artery: Secondary | ICD-10-CM

## 2011-10-18 DIAGNOSIS — I4892 Unspecified atrial flutter: Secondary | ICD-10-CM

## 2011-10-18 DIAGNOSIS — I4891 Unspecified atrial fibrillation: Secondary | ICD-10-CM

## 2011-10-18 DIAGNOSIS — Z7901 Long term (current) use of anticoagulants: Secondary | ICD-10-CM

## 2011-10-25 ENCOUNTER — Ambulatory Visit (INDEPENDENT_AMBULATORY_CARE_PROVIDER_SITE_OTHER): Payer: Medicare Other | Admitting: *Deleted

## 2011-10-25 DIAGNOSIS — I4891 Unspecified atrial fibrillation: Secondary | ICD-10-CM

## 2011-10-25 DIAGNOSIS — Z7901 Long term (current) use of anticoagulants: Secondary | ICD-10-CM

## 2011-10-25 DIAGNOSIS — I6350 Cerebral infarction due to unspecified occlusion or stenosis of unspecified cerebral artery: Secondary | ICD-10-CM

## 2011-10-25 DIAGNOSIS — I4892 Unspecified atrial flutter: Secondary | ICD-10-CM

## 2011-10-25 LAB — POCT INR: INR: 2.7

## 2011-10-27 DIAGNOSIS — R0602 Shortness of breath: Secondary | ICD-10-CM

## 2011-10-31 ENCOUNTER — Encounter: Payer: Self-pay | Admitting: Cardiovascular Disease

## 2011-10-31 ENCOUNTER — Telehealth: Payer: Self-pay | Admitting: *Deleted

## 2011-10-31 ENCOUNTER — Encounter: Payer: Self-pay | Admitting: *Deleted

## 2011-10-31 ENCOUNTER — Ambulatory Visit (INDEPENDENT_AMBULATORY_CARE_PROVIDER_SITE_OTHER): Payer: Medicare Other | Admitting: Cardiovascular Disease

## 2011-10-31 VITALS — BP 156/75 | HR 61 | Ht 70.0 in | Wt 219.0 lb

## 2011-10-31 DIAGNOSIS — I4891 Unspecified atrial fibrillation: Secondary | ICD-10-CM

## 2011-10-31 DIAGNOSIS — Z7901 Long term (current) use of anticoagulants: Secondary | ICD-10-CM

## 2011-10-31 DIAGNOSIS — R0602 Shortness of breath: Secondary | ICD-10-CM

## 2011-10-31 DIAGNOSIS — D649 Anemia, unspecified: Secondary | ICD-10-CM

## 2011-10-31 DIAGNOSIS — I2510 Atherosclerotic heart disease of native coronary artery without angina pectoris: Secondary | ICD-10-CM

## 2011-10-31 NOTE — Patient Instructions (Signed)
Your physician recommends that you continue on your current medications as directed. Please refer to the Current Medication list given to you today. Your physician recommends that you go to the [facility] for lab work. DO LABS ON 11/04/11. Your physician has recommended that you have a Cardioversion (DCCV). Electrical Cardioversion uses a jolt of electricity to your heart either through paddles or wired patches attached to your chest. This is a controlled, usually prescheduled, procedure. Defibrillation is done under light anesthesia in the hospital, and you usually go home the day of the procedure. This is done to get your heart back into a normal rhythm. You are not awake for the procedure. Please see the instruction sheet given to you today.

## 2011-10-31 NOTE — Progress Notes (Signed)
HPI   this is an 78 year old male who is here today for a followup visit. He requested an urgent appointment and could not see Dr. Andee Lineman.  He has a history of persistent atrial fibrillation and required cardioversion in the past. He used to be on Pradaxa  Which was complicated by GI bleed. He has been on warfarin without any complications. He has been feeling tired and more out of breath similar to how he felt when he was in atrial fibrillation.  He also had few episodes of right-sided chest discomfort which was worse with certain movements and with touch. He noticed dark stool last week but that was after he took iron supplements. He had an echocardiogram done recently at [facility] which was requested by Dr. [name]. It showed mildly reduced LV systolic function with an ejection fraction of 45-50% which is is not significantly different from his previous echocardiogram. His dose of amiodarone was increased to 200 mg once daily because he was noted to be in atrial fibrillation. He feels as fast as going fast at night.  Allergies  Allergen Reactions  . Dabigatran     REACTION: Bleeding-low HGB  . Penicillins     REACTION: swelling     Current Outpatient Prescriptions on File Prior to Visit  Medication Sig Dispense Refill  . amiodarone (PACERONE) 100 MG tablet Take 200 mg by mouth daily.      Marland Kitchen aspirin 81 MG tablet Take 81 mg by mouth daily.        . ferrous sulfate 325 (65 FE) MG tablet Take 325 mg by mouth daily with breakfast.        . fish oil-omega-3 fatty acids 1000 MG capsule Take 1 g by mouth daily.        . furosemide (LASIX) 20 MG tablet Take 20 mg by mouth daily.       Marland Kitchen LANTUS 100 UNIT/ML injection Inject 25 Units into the skin at bedtime.      Marland Kitchen lisinopril (PRINIVIL,ZESTRIL) 20 MG tablet Take 1 tablet by mouth Daily.      . metoprolol (TOPROL-XL) 50 MG 24 hr tablet Take 50 mg by mouth daily.       Marland Kitchen NITROSTAT 0.4 MG SL tablet Place 1 tablet under the tongue every 5  (five) minutes x 3 doses as needed.      . warfarin (COUMADIN) 5 MG tablet Take by mouth as directed.           Past Medical History  Diagnosis Date  . Paroxysmal atrial fibrillation     s/p recurrent cardioversion x2  . Hypertension   . Hyperlipidemia   . CHF (congestive heart failure)     s/p drug eluting stent to the diagonal 2007  . Chronic kidney disease     chronic renal insufficiency  . GI bleed 07/2010    with subsequent discontinuation of Pradaxa  . Right bundle branch block   . CVA (cerebral vascular accident) 09/2010    suspected embolic while off anticoagulation.  . Ejection fraction < 50%     Most recent ejection fraction 30-35%  . Coronary artery disease     Chronically occluded LAD, patent stent  to diagonal otherwise nonobstructive CAD     History reviewed. No pertinent past surgical history.   Family History  Problem Relation Age of Onset  . Diabetes Other   . Hypertension Other      History   Social History  . Marital Status: Married  Spouse Name: N/A    Number of Children: N/A  . Years of Education: N/A   Occupational History  . Not on file.   Social History Main Topics  . Smoking status: Former Smoker -- 0.3 packs/day for 40 years    Types: Cigarettes    Quit date: 08/15/1969  . Smokeless tobacco: Never Used  . Alcohol Use: No  . Drug Use: Not on file  . Sexually Active: Not on file   Other Topics Concern  . Not on file   Social History Narrative  . No narrative on file     PHYSICAL EXAM   BP 156/75  Pulse 61  Ht 5\' 10"  (1.778 m)  Wt 219 lb (99.338 kg)  BMI 31.42 kg/m2  SpO2 97%  Constitutional: He is oriented to person, place, and time. He appears well-developed and well-nourished. No distress.  HENT: No nasal discharge.  Head: Normocephalic and atraumatic.  Eyes: Pupils are equal and round. Right eye exhibits no discharge. Left eye exhibits no discharge.  Neck: Normal range of motion. Neck supple. No JVD present.  No thyromegaly present.  Cardiovascular: Normal rate, irregular rhythm, normal heart sounds and. Exam reveals no gallop and no friction rub. No murmur heard.  Pulmonary/Chest: Effort normal and breath sounds normal. No stridor. No respiratory distress. He has no wheezes. He has no rales. He exhibits no tenderness.  Abdominal: Soft. Bowel sounds are normal. He exhibits no distension. There is no tenderness. There is no rebound and no guarding.  Musculoskeletal: Normal range of motion. He exhibits no edema and no tenderness.  Neurological: He is alert and oriented to person, place, and time. Coordination normal.  Skin: Skin is warm and dry. No rash noted. He is not diaphoretic. No erythema. No pallor.  Psychiatric: He has a normal mood and affect. His behavior is normal. Judgment and thought content normal.      EKG:  Atrial fibrillation with right bundle branch block. There is evidence of prior anteroseptal infarct.   ASSESSMENT AND PLAN

## 2011-10-31 NOTE — Telephone Encounter (Signed)
No precert required 

## 2011-10-31 NOTE — Assessment & Plan Note (Signed)
The patient reports one episode of dark stool but that was after he took and supplements. I will check a CBC.

## 2011-10-31 NOTE — Assessment & Plan Note (Signed)
The patient is back in atrial fibrillation and seems to be symptomatic with fatigue and dyspnea. I agree with increasing the dose of amiodarone back to 200 mg once daily. He seems to be tolerating the medication. It appears that the patient is usually very symptomatic when he goes back into atrial fibrillation in spite of reasonably controlled heart rate. Due to that, I recommend proceeding with cardioversion. The patient has been on anticoagulation with warfarin with therapeutic INR. I will obtain routine labs this week including CBC , CMP , thyroid panel and an INR. I will schedule the cardioversion for next week with Dr. Andee Lineman.  if the patient continues to complain of dyspnea and fatigue after maintaining sinus rhythm , then other etiologies will have to be investigated.

## 2011-10-31 NOTE — Assessment & Plan Note (Signed)
The patient does not report any recurrent chest pain. His echocardiogram actually showed slight improvement in his LV systolic dysfunction. He is known to have an occluded LAD. Continue medical therapy.

## 2011-10-31 NOTE — Telephone Encounter (Signed)
Precert-DCCV 11/10/11 at [facility] w/Dr. Earnestine Leys

## 2011-11-08 ENCOUNTER — Telehealth: Payer: Self-pay | Admitting: *Deleted

## 2011-11-08 DIAGNOSIS — E875 Hyperkalemia: Secondary | ICD-10-CM

## 2011-11-08 NOTE — Telephone Encounter (Signed)
Message copied by Arlyss Gandy on Tue Nov 08, 2011  8:04 AM ------      Message from: Prescott Parma C      Created: Mon Nov 07, 2011  5:04 PM       Creatinine 2.4, K 5.8. NL CBC and TSH. INR 2.9.      DC Lasix and Lisinopril (left message with pt). Repeat BMET in AM. Pt should not be on any supplemental K.

## 2011-11-08 NOTE — Telephone Encounter (Signed)
Pt did not get message left by PA. Pt notified per PA's instructions. He will have BMET done at [facility] this am and stop medications as indicated.

## 2011-11-09 ENCOUNTER — Telehealth: Payer: Self-pay | Admitting: *Deleted

## 2011-11-09 DIAGNOSIS — I4891 Unspecified atrial fibrillation: Secondary | ICD-10-CM

## 2011-11-09 DIAGNOSIS — Z7901 Long term (current) use of anticoagulants: Secondary | ICD-10-CM

## 2011-11-09 NOTE — Telephone Encounter (Signed)
Pt notified of results and verbalized understanding  

## 2011-11-09 NOTE — Telephone Encounter (Signed)
Left message to call back on voicemail (home and cell) regarding lab results.

## 2011-11-09 NOTE — Telephone Encounter (Signed)
Addended by: Arlyss Gandy on: 11/09/2011 02:51 PM   Modules accepted: Orders

## 2011-11-09 NOTE — Telephone Encounter (Signed)
Message copied by Arlyss Gandy on Wed Nov 09, 2011  2:28 PM ------      Message from: Prescott Parma C      Created: Wed Nov 09, 2011 11:56 AM       Labs much improved: K down to 5.0, creatinine down from 2.4 to 1.8. Continue current medication regimen. Will reassess at next OV.

## 2011-11-10 ENCOUNTER — Encounter: Payer: Self-pay | Admitting: Cardiology

## 2011-11-10 DIAGNOSIS — I4891 Unspecified atrial fibrillation: Secondary | ICD-10-CM

## 2011-11-15 ENCOUNTER — Encounter: Payer: Self-pay | Admitting: Cardiology

## 2011-11-15 ENCOUNTER — Ambulatory Visit (INDEPENDENT_AMBULATORY_CARE_PROVIDER_SITE_OTHER): Payer: Medicare Other | Admitting: *Deleted

## 2011-11-15 DIAGNOSIS — I6350 Cerebral infarction due to unspecified occlusion or stenosis of unspecified cerebral artery: Secondary | ICD-10-CM

## 2011-11-15 DIAGNOSIS — Z7901 Long term (current) use of anticoagulants: Secondary | ICD-10-CM

## 2011-11-15 DIAGNOSIS — I4891 Unspecified atrial fibrillation: Secondary | ICD-10-CM

## 2011-11-15 DIAGNOSIS — I4892 Unspecified atrial flutter: Secondary | ICD-10-CM

## 2011-11-25 ENCOUNTER — Ambulatory Visit (INDEPENDENT_AMBULATORY_CARE_PROVIDER_SITE_OTHER): Payer: Medicare Other | Admitting: *Deleted

## 2011-11-25 DIAGNOSIS — I6350 Cerebral infarction due to unspecified occlusion or stenosis of unspecified cerebral artery: Secondary | ICD-10-CM

## 2011-11-25 DIAGNOSIS — I4892 Unspecified atrial flutter: Secondary | ICD-10-CM

## 2011-11-25 DIAGNOSIS — Z7901 Long term (current) use of anticoagulants: Secondary | ICD-10-CM

## 2011-11-25 DIAGNOSIS — I4891 Unspecified atrial fibrillation: Secondary | ICD-10-CM

## 2011-11-29 ENCOUNTER — Ambulatory Visit (INDEPENDENT_AMBULATORY_CARE_PROVIDER_SITE_OTHER): Payer: Medicare Other | Admitting: *Deleted

## 2011-11-29 DIAGNOSIS — I6350 Cerebral infarction due to unspecified occlusion or stenosis of unspecified cerebral artery: Secondary | ICD-10-CM

## 2011-11-29 DIAGNOSIS — I4892 Unspecified atrial flutter: Secondary | ICD-10-CM

## 2011-11-29 DIAGNOSIS — Z7901 Long term (current) use of anticoagulants: Secondary | ICD-10-CM

## 2011-11-29 DIAGNOSIS — I4891 Unspecified atrial fibrillation: Secondary | ICD-10-CM

## 2011-11-29 LAB — POCT INR: INR: 3.5

## 2011-12-09 ENCOUNTER — Ambulatory Visit (INDEPENDENT_AMBULATORY_CARE_PROVIDER_SITE_OTHER): Payer: Medicare Other | Admitting: *Deleted

## 2011-12-09 DIAGNOSIS — I4892 Unspecified atrial flutter: Secondary | ICD-10-CM

## 2011-12-09 DIAGNOSIS — Z7901 Long term (current) use of anticoagulants: Secondary | ICD-10-CM

## 2011-12-09 DIAGNOSIS — I6350 Cerebral infarction due to unspecified occlusion or stenosis of unspecified cerebral artery: Secondary | ICD-10-CM

## 2011-12-09 DIAGNOSIS — I4891 Unspecified atrial fibrillation: Secondary | ICD-10-CM

## 2011-12-09 LAB — POCT INR: INR: 1.9

## 2011-12-09 MED ORDER — WARFARIN SODIUM 2.5 MG PO TABS: 2.5000 mg | ORAL_TABLET | Freq: Every day | ORAL | Status: DC

## 2011-12-23 ENCOUNTER — Telehealth: Payer: Self-pay | Admitting: Cardiology

## 2011-12-23 ENCOUNTER — Ambulatory Visit (INDEPENDENT_AMBULATORY_CARE_PROVIDER_SITE_OTHER): Payer: Medicare Other | Admitting: *Deleted

## 2011-12-23 DIAGNOSIS — I4891 Unspecified atrial fibrillation: Secondary | ICD-10-CM

## 2011-12-23 DIAGNOSIS — Z7901 Long term (current) use of anticoagulants: Secondary | ICD-10-CM

## 2011-12-23 DIAGNOSIS — I4892 Unspecified atrial flutter: Secondary | ICD-10-CM

## 2011-12-23 DIAGNOSIS — I6350 Cerebral infarction due to unspecified occlusion or stenosis of unspecified cerebral artery: Secondary | ICD-10-CM

## 2011-12-23 NOTE — Telephone Encounter (Signed)
Patient wife is concerned due to [name] [name] having memory loss and weakness in his legs.  She would like to know if his medicine could be causing this.Marland KitchenMarland Kitchen

## 2011-12-25 NOTE — Telephone Encounter (Signed)
Don't think medications have anything to do with this may need to see Neurologist for assessment memory loss.

## 2011-12-30 NOTE — Telephone Encounter (Signed)
Wife ([name]) notified of below.   States they have appointment with GD on Monday & will discuss further at that time.

## 2012-01-02 ENCOUNTER — Ambulatory Visit (INDEPENDENT_AMBULATORY_CARE_PROVIDER_SITE_OTHER): Payer: Medicare Other | Admitting: Cardiology

## 2012-01-02 ENCOUNTER — Encounter: Payer: Self-pay | Admitting: Cardiology

## 2012-01-02 VITALS — BP 155/90 | HR 54 | Ht 70.0 in | Wt 214.0 lb

## 2012-01-02 DIAGNOSIS — I5022 Chronic systolic (congestive) heart failure: Secondary | ICD-10-CM

## 2012-01-02 DIAGNOSIS — Z7901 Long term (current) use of anticoagulants: Secondary | ICD-10-CM

## 2012-01-02 DIAGNOSIS — I2510 Atherosclerotic heart disease of native coronary artery without angina pectoris: Secondary | ICD-10-CM

## 2012-01-02 DIAGNOSIS — K922 Gastrointestinal hemorrhage, unspecified: Secondary | ICD-10-CM

## 2012-01-02 DIAGNOSIS — I4891 Unspecified atrial fibrillation: Secondary | ICD-10-CM

## 2012-01-02 NOTE — Assessment & Plan Note (Signed)
No heart failure symptoms. Ejection fraction 35%. NYHA class 1-2.

## 2012-01-02 NOTE — Assessment & Plan Note (Signed)
Stable and followed by the patient's primary care physician 

## 2012-01-02 NOTE — Patient Instructions (Addendum)
Continue all current medications. Your physician wants you to follow up in: 6 months.  You will receive a reminder letter in the mail one-two months in advance.  If you don't receive a letter, please call our office to schedule the follow up appointment   

## 2012-01-02 NOTE — Assessment & Plan Note (Signed)
Tolerating warfarin with no GI bleeding or nausea. Dabigatran was discontinued due to severe GI bleeding and nausea.

## 2012-01-02 NOTE — Assessment & Plan Note (Signed)
Patient tolerating amiodarone at higher dose 200 mg a day. Status post successful cardioversion March 2013. No recurrence of palpitations. Normal sinus rhythm the office today.

## 2012-01-02 NOTE — Progress Notes (Signed)
Peyton Bottoms, MD, [facility] Board Certified in Adult Cardiovascular Medicine,Internal Medicine and Critical Care Medicine    CC: Follow patient after recent cardioversion  HPI:  The patient has been doing well. He reports no recurrent palpitations although he does state that for a few seconds in the morning he feels that his heart is racing a bit. These episodes are so brief done but no further intervention is required. Overall the patient states that he is feeling well he has no chest pain shortness of breath orthopnea PND. He remains very active and is driven his motorbike several times that the [facility]. He is concerned about "feeling off balance" however on further examination and interrogation it appears that the patient is a weakness in the right leg related to back problems. He has no ataxia that could be related to amiodarone. He also reports bending over and is treating himself out he feels dizzy just very briefly her some back pain associated with this. Again these episodes are very brief and not associated presyncope or syncope. Patient will need blood work and also has requested that Coumadin will be followed by Dr. [name] due to high cost of Coumadin check in our office. I told him this was perfectly fine and have also asked him to make sure that Dr. [name] when he does blood work checked TSH to make sure that he does not have hypothyroidism or hyperthyroidism in the setting of amiodarone.   PMH: reviewed and listed in Problem List in Electronic Records (and see below) Past Medical History  Diagnosis Date  . Paroxysmal atrial fibrillation     s/p recurrent cardioversion x2  . Hypertension   . Hyperlipidemia   . CHF (congestive heart failure)     s/p drug eluting stent to the diagonal 2007  . Chronic kidney disease     chronic renal insufficiency  . GI bleed 07/2010    with subsequent discontinuation of Pradaxa  . Right bundle branch block   . CVA (cerebral vascular  accident) 09/2010    suspected embolic while off anticoagulation.  . Ejection fraction < 50%     Most recent ejection fraction 30-35%  . Coronary artery disease     Chronically occluded LAD, patent stent  to diagonal otherwise nonobstructive CAD   No past surgical history on file.  Allergies/SH/FHX : available in Electronic Records for review  Allergies  Allergen Reactions  . Dabigatran     REACTION: Bleeding-low HGB  . Penicillins     REACTION: swelling   History   Social History  . Marital Status: Married    Spouse Name: N/A    Number of Children: N/A  . Years of Education: N/A   Occupational History  . Not on file.   Social History Main Topics  . Smoking status: Former Smoker -- 0.3 packs/day for 40 years    Types: Cigarettes    Quit date: 08/15/1969  . Smokeless tobacco: Never Used  . Alcohol Use: No  . Drug Use: Not on file  . Sexually Active: Not on file   Other Topics Concern  . Not on file   Social History Narrative  . No narrative on file   Family History  Problem Relation Age of Onset  . Diabetes Other   . Hypertension Other     Medications: Current Outpatient Prescriptions  Medication Sig Dispense Refill  . amiodarone (PACERONE) 100 MG tablet Take 200 mg by mouth daily.      Marland Kitchen  aspirin 81 MG tablet Take 81 mg by mouth daily.        . ferrous sulfate 325 (65 FE) MG tablet Take 325 mg by mouth daily with breakfast.        . fish oil-omega-3 fatty acids 1000 MG capsule Take 1 g by mouth daily.        . furosemide (LASIX) 20 MG tablet Take 20 mg by mouth daily.      Marland Kitchen guaiFENesin (MUCINEX) 600 MG 12 hr tablet Take 1,200 mg by mouth as directed.      Marland Kitchen LANTUS 100 UNIT/ML injection Inject 25 Units into the skin at bedtime.      Marland Kitchen lisinopril (PRINIVIL,ZESTRIL) 20 MG tablet Take 20 mg by mouth daily.      . metoprolol (TOPROL-XL) 50 MG 24 hr tablet Take 25 mg by mouth daily.       Marland Kitchen NITROSTAT 0.4 MG SL tablet Place 1 tablet under the tongue every 5  (five) minutes x 3 doses as needed.      . warfarin (COUMADIN) 2.5 MG tablet Take 1 tablet (2.5 mg total) by mouth daily.  30 tablet  3    ROS: No nausea or vomiting. No fever or chills.No melena or hematochezia.No bleeding.No claudication  Physical Exam: BP 155/90  Pulse 54  Ht 5\' 10"  (1.778 m)  Wt 214 lb (97.07 kg)  BMI 30.71 kg/m2 General: Well-nourished white male in no apparent distress. Reports feeling good Neck: Normal carotid upstroke no carotid bruits. No thyromegaly nonnodular thyroid. Lungs: Clear breath sounds bilaterally without wheezing. Cardiac: Regular rate and rhythm with normal S1-S2. No murmur rubs or gallops. No S3 or S4 Vascular: No edema. Normal distal pulses Skin: Warm and dry Physcologic: Normal affect  12lead ECG: Single-lead telemetry strip shows normal sinus rhythm Limited bedside ECHO:N/A No images are attached to the encounter.   Assessment and Plan  Encounter for long-term (current) use of anticoagulants Tolerating warfarin with no GI bleeding or nausea. Dabigatran was discontinued due to severe GI bleeding and nausea.  GI bleed Stable and followed by the patient's primary care physician.  LEFT VENTRICULAR FUNCTION, DECREASED Ischemic cardiomyopathy but also possibility of superimposed tachycardia-induced cardiomyopathy. Patient is now back in normal sinus rhythm we'll reassess LV function in the next couple months.  SYSTOLIC HEART FAILURE, CHRONIC No heart failure symptoms. Ejection fraction 35%. NYHA class 1-2.  CAD, NATIVE VESSEL Status post prior intervention of the diagonal branch and chronic occlusion of the LAD. No recurrent chest pain continue medical therapy  Atrial fibrillation Patient tolerating amiodarone at higher dose 200 mg a day. Status post successful cardioversion March 2013. No recurrence of palpitations. Normal sinus rhythm the office today.    Patient Active Problem List  Diagnoses  . HYPERLIPIDEMIA-MIXED  .  HYPERTENSION, UNSPECIFIED  . CAD, NATIVE VESSEL  . RBBB  . Atrial fibrillation  . SYSTOLIC HEART FAILURE, CHRONIC  . LEFT VENTRICULAR FUNCTION, DECREASED  . RENAL INSUFFICIENCY, CHRONIC  . UNSPECIFIED SLEEP APNEA  . ANEMIA  . Encounter for long-term (current) use of anticoagulants  . CVA (cerebral vascular accident)  . GI bleed

## 2012-01-02 NOTE — Assessment & Plan Note (Signed)
Status post prior intervention of the diagonal branch and chronic occlusion of the LAD. No recurrent chest pain continue medical therapy

## 2012-01-02 NOTE — Assessment & Plan Note (Signed)
Ischemic cardiomyopathy but also possibility of superimposed tachycardia-induced cardiomyopathy. Patient is now back in normal sinus rhythm we'll reassess LV function in the next couple months.

## 2012-02-07 ENCOUNTER — Ambulatory Visit: Payer: Self-pay | Admitting: *Deleted

## 2012-02-07 DIAGNOSIS — I4891 Unspecified atrial fibrillation: Secondary | ICD-10-CM

## 2012-02-07 DIAGNOSIS — Z7901 Long term (current) use of anticoagulants: Secondary | ICD-10-CM

## 2012-02-28 ENCOUNTER — Telehealth: Payer: Self-pay | Admitting: *Deleted

## 2012-02-28 MED ORDER — AMIODARONE HCL 100 MG PO TABS: 200.0000 mg | ORAL_TABLET | Freq: Every day | ORAL | Status: DC

## 2012-02-28 NOTE — Telephone Encounter (Signed)
NEEDS REFILL ON [facility] pharmacy [facility] Please call patient [phone]

## 2012-04-19 ENCOUNTER — Ambulatory Visit: Payer: Medicare Other | Admitting: Cardiology

## 2012-04-20 ENCOUNTER — Encounter: Payer: Self-pay | Admitting: Cardiology

## 2012-04-20 ENCOUNTER — Ambulatory Visit (INDEPENDENT_AMBULATORY_CARE_PROVIDER_SITE_OTHER): Payer: Medicare Other | Admitting: Cardiology

## 2012-04-20 VITALS — BP 138/76 | HR 61 | Ht 70.0 in | Wt 214.0 lb

## 2012-04-20 DIAGNOSIS — I2510 Atherosclerotic heart disease of native coronary artery without angina pectoris: Secondary | ICD-10-CM

## 2012-04-20 DIAGNOSIS — I4891 Unspecified atrial fibrillation: Secondary | ICD-10-CM

## 2012-04-20 DIAGNOSIS — I5022 Chronic systolic (congestive) heart failure: Secondary | ICD-10-CM

## 2012-04-20 NOTE — Assessment & Plan Note (Signed)
Persistent, has failed prior cardioversion attempts and amiodarone at this point. After reviewing the options (described above) the plan is to discontinue amiodarone, and continuous strategy of heart rate control and anticoagulation. I have asked him to check heart rate and blood pressure regularly after we discontinued amiodarone, to see if metoprolol needs to be further increased. He will followup in one month, sooner if needed.

## 2012-04-20 NOTE — Patient Instructions (Addendum)
Your physician recommends that you schedule a follow-up appointment in: 1 month with [facility]. Your physician has recommended you make the following change in your medication: Stop amiodarone. Please continue all other medications as before. Please monitor your heart rate and blood pressure daily. Bring results to your next visit.

## 2012-04-20 NOTE — Progress Notes (Signed)
Clinical Summary Mr. Hinks is a 78 y.o.male referred back to the office by Dr. [name]. He is a former patient of Dr. Andee Lineman. This is my first meeting with him. He is here with his wife and daughter.   I reviewed his medical history. He has been on amiodarone and Coumadin, status post previous cardioversion attempts to try and maintain normal sinus rhythm. He was noted to be back in atrial fibrillation on routine ECG with Dr. [name] recently. He was unaware that he had gone back into atrial fibrillation, specifically not experiencing any new palpitations, chest pain, or shortness of breath. He reports stable functional capacity, NYHA class 2-3 dyspnea at baseline. He states he is tolerating Coumadin without bleeding problems, followed by Dr. [name]. No reported syncope or falls.  I discussed the situation in detail with the patient and his family members. It is not entirely clear that he has felt remarkably better  following restoration of sinus rhythm in the past. His heart rate is well controlled today. We discussed options including repeat cardioversion attempt, switching to a different antiarrhythmic with repeat cardioversion attempt, versus leaving him in atrial fibrillation with strategy of heart rate control and anticoagulation. After considering these options, our plan is to stop amiodarone, and treat him with a combination of metoprolol and Coumadin with heart rate control and observation.  ECG today confirms atrial fibrillation.   Allergies  Allergen Reactions  . Dabigatran     REACTION: Bleeding-low HGB  . Penicillins     REACTION: swelling    Current Outpatient Prescriptions  Medication Sig Dispense Refill  . aspirin 81 MG tablet Take 81 mg by mouth daily.        . fish oil-omega-3 fatty acids 1000 MG capsule Take 1 g by mouth daily.        . furosemide (LASIX) 20 MG tablet Take 10 mg by mouth daily.       Marland Kitchen guaiFENesin (MUCINEX) 600 MG 12 hr tablet Take 1,200 mg by mouth as  directed.      Marland Kitchen LANTUS 100 UNIT/ML injection Inject 25 Units into the skin at bedtime.      Marland Kitchen lisinopril (PRINIVIL,ZESTRIL) 20 MG tablet Take 20 mg by mouth daily.      . metoprolol (TOPROL-XL) 50 MG 24 hr tablet Take 25 mg by mouth daily.       Marland Kitchen NITROSTAT 0.4 MG SL tablet Place 1 tablet under the tongue every 5 (five) minutes x 3 doses as needed.      . warfarin (COUMADIN) 2.5 MG tablet Take 1 tablet (2.5 mg total) by mouth daily.  30 tablet  3    Past Medical History  Diagnosis Date  . Paroxysmal atrial fibrillation     s/p recurrent cardioversion x2  . Hypertension   . Hyperlipidemia   . CHF (congestive heart failure)     s/p drug eluting stent to the diagonal 2007  . Chronic kidney disease     chronic renal insufficiency  . GI bleed 07/2010    with subsequent discontinuation of Pradaxa  . Right bundle branch block   . CVA (cerebral vascular accident) 09/2010    suspected embolic while off anticoagulation.  . Ejection fraction < 50%     Most recent ejection fraction 30-35%  . Coronary artery disease     Chronically occluded LAD, patent stent  to diagonal otherwise nonobstructive CAD    Social History Mr. Mosley reports that he quit smoking about 42 years ago. His  smoking use included Cigarettes. He has a 12 pack-year smoking history. He has never used smokeless tobacco. Mr. Pacitti reports that he does not drink alcohol.  Review of Systems Negative except as outlined.  Physical Examination Filed Vitals:   04/20/12 1352  BP: 138/76  Pulse: 61   Obese male in no acute distress. HEENT: Conjunctiva and lids normal, oropharynx clear. Neck: Supple, no elevated JVP or carotid bruits, no thyromegaly. Lungs: Clear to auscultation, diminished, nonlabored breathing at rest. Cardiac: Irregularly irregular, no S3, indistinct PMI, soft systolic murmur, no pericardial rub. Abdomen: Soft, nontender, protuberant, bowel sounds present, no guarding or rebound. Extremities: Trace  edema, distal pulses 2+. Skin: Warm and dry. Musculoskeletal: No kyphosis. Neuropsychiatric: Alert and oriented x3, affect grossly appropriate.   Problem List and Plan   Atrial fibrillation Persistent, has failed prior cardioversion attempts and amiodarone at this point. After reviewing the options (described above) the plan is to discontinue amiodarone, and continuous strategy of heart rate control and anticoagulation. I have asked him to check heart rate and blood pressure regularly after we discontinued amiodarone, to see if metoprolol needs to be further increased. He will followup in one month, sooner if needed.  CAD, NATIVE VESSEL No active angina. Continues on aspirin, beta blocker, ACE inhibitor, and omega-3 supplements.  SYSTOLIC HEART FAILURE, CHRONIC No change in baseline dyspnea. Weight is stable. We will eventually plan a followup echocardiogram to reassess LV function.    Jonelle Sidle, M.D., F.A.C.C.

## 2012-04-20 NOTE — Assessment & Plan Note (Signed)
No change in baseline dyspnea. Weight is stable. We will eventually plan a followup echocardiogram to reassess LV function.

## 2012-04-20 NOTE — Assessment & Plan Note (Signed)
No active angina. Continues on aspirin, beta blocker, ACE inhibitor, and omega-3 supplements.

## 2012-05-03 ENCOUNTER — Ambulatory Visit: Payer: Medicare Other | Admitting: Cardiology

## 2012-05-23 ENCOUNTER — Ambulatory Visit (INDEPENDENT_AMBULATORY_CARE_PROVIDER_SITE_OTHER): Payer: Medicare Other | Admitting: Cardiology

## 2012-05-23 ENCOUNTER — Encounter: Payer: Self-pay | Admitting: Cardiology

## 2012-05-23 VITALS — BP 120/74 | HR 52 | Ht 70.0 in | Wt 214.8 lb

## 2012-05-23 DIAGNOSIS — I4891 Unspecified atrial fibrillation: Secondary | ICD-10-CM

## 2012-05-23 DIAGNOSIS — I2510 Atherosclerotic heart disease of native coronary artery without angina pectoris: Secondary | ICD-10-CM

## 2012-05-23 NOTE — Patient Instructions (Addendum)

## 2012-05-23 NOTE — Assessment & Plan Note (Signed)
No active angina on medical therapy. 

## 2012-05-23 NOTE — Progress Notes (Signed)
Clinical Summary Mr. [name] [name] is a 78 y.o.male presenting for followup. I met him in September and medication adjustments were made, including discontinuation of Amiodarone. He states that he has done fairly well, shows me record of his heart rate and blood pressure at home. He has not had any tachycardia or bradycardia of significance. Functional status unchanged, in fact may feel a little better since stopping Amiodarone. Discussed situation with patient, wife, and daughter. They remain comfortable with plan for management of atrial fibrillation with heart rate control and anticoagulation.   Allergies  Allergen Reactions  . Dabigatran     REACTION: Bleeding-low HGB  . Penicillins     REACTION: swelling    Current Outpatient Prescriptions  Medication Sig Dispense Refill  . aspirin 81 MG tablet Take 81 mg by mouth daily.        . fish oil-omega-3 fatty acids 1000 MG capsule Take 1 g by mouth daily.        . furosemide (LASIX) 20 MG tablet Take 10 mg by mouth daily.       Marland Kitchen guaiFENesin (MUCINEX) 600 MG 12 hr tablet Take 1,200 mg by mouth as directed.      Marland Kitchen LANTUS 100 UNIT/ML injection Inject 25 Units into the skin at bedtime.      Marland Kitchen lisinopril (PRINIVIL,ZESTRIL) 20 MG tablet Take 20 mg by mouth daily.      . metoprolol (TOPROL-XL) 50 MG 24 hr tablet Take 25 mg by mouth daily.       Marland Kitchen NITROSTAT 0.4 MG SL tablet Place 1 tablet under the tongue every 5 (five) minutes x 3 doses as needed.      . warfarin (COUMADIN) 2.5 MG tablet Take 1 tablet (2.5 mg total) by mouth daily.  30 tablet  3    Past Medical History  Diagnosis Date  . Paroxysmal atrial fibrillation     s/p recurrent cardioversion x2  . Hypertension   . Hyperlipidemia   . CHF (congestive heart failure)     s/p drug eluting stent to the diagonal 2007  . Chronic kidney disease     chronic renal insufficiency  . GI bleed 07/2010    with subsequent discontinuation of Pradaxa  . Right bundle branch block   . CVA (cerebral  vascular accident) 09/2010    suspected embolic while off anticoagulation.  . Ejection fraction < 50%     Most recent ejection fraction 30-35%  . Coronary artery disease     Chronically occluded LAD, patent stent  to diagonal otherwise nonobstructive CAD    Social History Mr. Opheim reports that he quit smoking about 42 years ago. His smoking use included Cigarettes. He has a 12 pack-year smoking history. He has never used smokeless tobacco. Mr. Elzey reports that he does not drink alcohol.  Review of Systems No reported bleeding episodes. No angina. Some leg weakness that has been longer standing. No falls.  Physical Examination Filed Vitals:   05/23/12 0834  BP: 120/74  Pulse: 52   Filed Weights   05/23/12 0834  Weight: 214 lb 12.8 oz (97.433 kg)   HEENT: Conjunctiva and lids normal, oropharynx clear.  Neck: Supple, no elevated JVP or carotid bruits, no thyromegaly.  Lungs: Clear to auscultation, diminished, nonlabored breathing at rest.  Cardiac: Irregularly irregular, no S3, indistinct PMI, soft systolic murmur, no pericardial rub.  Abdomen: Soft, nontender, protuberant, bowel sounds present, no guarding or rebound.  Extremities: Trace edema, distal pulses 2+.    Problem List  and Plan   Atrial fibrillation Symptomatically stable - continue strategy of heart rate control and anticoagulation. Followup arranged.  CAD, NATIVE VESSEL No active angina on medical therapy.    Jonelle Sidle, M.D., F.A.C.C.

## 2012-05-23 NOTE — Assessment & Plan Note (Signed)
Symptomatically stable - continue strategy of heart rate control and anticoagulation. Followup arranged.

## 2012-10-16 ENCOUNTER — Encounter: Payer: Self-pay | Admitting: Cardiology

## 2012-10-16 ENCOUNTER — Ambulatory Visit (INDEPENDENT_AMBULATORY_CARE_PROVIDER_SITE_OTHER): Payer: Medicare Other | Admitting: Cardiology

## 2012-10-16 VITALS — BP 139/82 | HR 67 | Ht 70.0 in | Wt 217.0 lb

## 2012-10-16 DIAGNOSIS — I2510 Atherosclerotic heart disease of native coronary artery without angina pectoris: Secondary | ICD-10-CM

## 2012-10-16 DIAGNOSIS — I4891 Unspecified atrial fibrillation: Secondary | ICD-10-CM

## 2012-10-16 DIAGNOSIS — I5022 Chronic systolic (congestive) heart failure: Secondary | ICD-10-CM

## 2012-10-16 MED ORDER — METOPROLOL SUCCINATE ER 25 MG PO TB24: 25.0000 mg | ORAL_TABLET | Freq: Every day | ORAL | Status: DC

## 2012-10-16 NOTE — Progress Notes (Signed)
Clinical Summary [name] [name] is an 79 y.o.male presenting for followup. He was seen in October 2013. He is here with his wife today. He noted weight gain earlier in February, was actually contacted by a nurse from his insurance company and encouraged to see his primary doctor. He was evaluated by Dr. Dimas [name] and Lasix dose was intensified with followup weights showing a nice reduction of 8 pounds over a week associated with diuresis and improved dyspnea on exertion.  ECG today shows atrial fibrillation with regular branch block and old anterior infarct pattern. He does feel palpitations when he exerts himself. We discussed a reasonable exercise regimen, also the fact that he does need to pace himself somewhat.  Medications reviewed, plan to switch back to Toprol-XL. We will keep his Lasix at 40 mg daily which has been keeping his weight stable over the last few weeks.   Allergies  Allergen Reactions  . Dabigatran     REACTION: Bleeding-low HGB  . Penicillins     REACTION: swelling    Current Outpatient Prescriptions  Medication Sig Dispense Refill  . aspirin 81 MG tablet Take 81 mg by mouth daily.        . COD LIVER OIL PO Take 1 capsule by mouth daily.      . fish oil-omega-3 fatty acids 1000 MG capsule Take 1 g by mouth daily.        . furosemide (LASIX) 20 MG tablet Take 40 mg by mouth daily.       Marland Kitchen guaiFENesin (MUCINEX) 600 MG 12 hr tablet Take 1,200 mg by mouth as directed.      Marland Kitchen LANTUS 100 UNIT/ML injection Inject 25 Units into the skin at bedtime.      Marland Kitchen lisinopril (PRINIVIL,ZESTRIL) 20 MG tablet Take 20 mg by mouth daily.      Marland Kitchen NITROSTAT 0.4 MG SL tablet Place 1 tablet under the tongue every 5 (five) minutes x 3 doses as needed.      . warfarin (COUMADIN) 2 MG tablet Take 2 mg by mouth daily.      . metoprolol succinate (TOPROL XL) 25 MG 24 hr tablet Take 1 tablet (25 mg total) by mouth daily.  90 tablet  3   No current facility-administered medications for this visit.     Past Medical History  Diagnosis Date  . Paroxysmal atrial fibrillation     s/p recurrent cardioversion x2  . Hypertension   . Hyperlipidemia   . CHF (congestive heart failure)     s/p drug eluting stent to the diagonal 2007  . Chronic kidney disease     chronic renal insufficiency  . GI bleed 07/2010    with subsequent discontinuation of Pradaxa  . Right bundle branch block   . CVA (cerebral vascular accident) 09/2010    suspected embolic while off anticoagulation.  . Ejection fraction < 50%     Most recent ejection fraction 30-35%  . Coronary artery disease     Chronically occluded LAD, patent stent  to diagonal otherwise nonobstructive CAD    Social History [name] [name] reports that he quit smoking about 43 years ago. His smoking use included Cigarettes. He has a 12 pack-year smoking history. He has never used smokeless tobacco. [name] [name] reports that he does not drink alcohol.  Review of Systems No bleeding episodes. No dizziness or syncope. No orthopnea or PND. Discussed his diet and sodium restriction.  Physical Examination Filed Vitals:   10/16/12 1253  BP: 139/82  Pulse: 67   Filed Weights   10/16/12 1253  Weight: 217 lb (98.431 kg)   No acute distress. HEENT: Conjunctiva and lids normal, oropharynx clear.  Neck: Supple, no elevated JVP or carotid bruits, no thyromegaly.  Lungs: Clear to auscultation, diminished, nonlabored breathing at rest.  Cardiac: Irregularly irregular, no S3, indistinct PMI, soft systolic murmur, no pericardial rub.  Abdomen: Soft, nontender, protuberant, bowel sounds present, no guarding or rebound.  Extremities: Trace edema, distal pulses 2+.    Problem List and Plan   SYSTOLIC HEART FAILURE, CHRONIC Switch back to Toprol-XL 25 mg daily, and continue Lasix at 40 mg daily. We discussed self modification of his diuretic regimen based on changes in weight. Otherwise encouraged regular exercise regimen. Followup arranged.  CAD,  NATIVE VESSEL No active angina symptoms, chronically occluded LAD with stent to diagonal, medical therapy to continue.  Atrial fibrillation Continue strategy of heart rate control and anticoagulation.    Jonelle Sidle, M.D., F.A.C.C.

## 2012-10-16 NOTE — Assessment & Plan Note (Signed)
Continue strategy of heart rate control and anticoagulation. 

## 2012-10-16 NOTE — Patient Instructions (Addendum)
Your physician recommends that you schedule a follow-up appointment in: 3 months. Your physician has recommended you make the following change in your medication: After your current supply of lopressor is finished, please start toprol xl 25 mg daily. Your new prescription has been sent to your pharmacy. All other medications will remain the same.

## 2012-10-16 NOTE — Assessment & Plan Note (Signed)
Switch back to Toprol-XL 25 mg daily, and continue Lasix at 40 mg daily. We discussed self modification of his diuretic regimen based on changes in weight. Otherwise encouraged regular exercise regimen. Followup arranged.

## 2012-10-16 NOTE — Assessment & Plan Note (Signed)
No active angina symptoms, chronically occluded LAD with stent to diagonal, medical therapy to continue.

## 2012-11-16 DIAGNOSIS — R0602 Shortness of breath: Secondary | ICD-10-CM

## 2012-12-03 ENCOUNTER — Ambulatory Visit (INDEPENDENT_AMBULATORY_CARE_PROVIDER_SITE_OTHER): Payer: Medicare Other | Admitting: Physician Assistant

## 2012-12-03 ENCOUNTER — Encounter: Payer: Self-pay | Admitting: Physician Assistant

## 2012-12-03 VITALS — BP 138/55 | HR 44 | Ht 70.0 in | Wt 218.0 lb

## 2012-12-03 DIAGNOSIS — I4891 Unspecified atrial fibrillation: Secondary | ICD-10-CM

## 2012-12-03 DIAGNOSIS — G4730 Sleep apnea, unspecified: Secondary | ICD-10-CM

## 2012-12-03 DIAGNOSIS — I059 Rheumatic mitral valve disease, unspecified: Secondary | ICD-10-CM

## 2012-12-03 DIAGNOSIS — N189 Chronic kidney disease, unspecified: Secondary | ICD-10-CM

## 2012-12-03 DIAGNOSIS — I2510 Atherosclerotic heart disease of native coronary artery without angina pectoris: Secondary | ICD-10-CM

## 2012-12-03 DIAGNOSIS — I340 Nonrheumatic mitral (valve) insufficiency: Secondary | ICD-10-CM

## 2012-12-03 NOTE — Assessment & Plan Note (Signed)
Patient continues to report no CP, and recent troponins were normal. Will consider an ischemic evaluation with a stress test in the near future, if 48 hour Holter monitor is unrevealing as the etiology for the patient's symptoms.

## 2012-12-03 NOTE — Assessment & Plan Note (Signed)
Recommend formal evaluation for OSA, to be deferred to primary M.D.

## 2012-12-03 NOTE — Patient Instructions (Signed)
   Labs today for BMET, TSH, CBC  48 hour holter monitor   Office will notify of results  Stop Aspirin Continue all other current medications. Follow up in  2 weeks

## 2012-12-03 NOTE — Progress Notes (Signed)
Primary Cardiologist: Simona Huh, MD   HPI: Post hospital followup from [facility], following presentation with progressive DOE. Troponins NL. Patient also presented with PAF SVR with rates as low as 40 bpm, including 2.1 second on admission EKG. We recommended stopping Toprol, and a complete echocardiogram was done.   - Echocardiogram, April 4: EF 45-50% (unchanged since 10/2011), with multiple WMAs, mild/moderate MR  Clinically, he continues to experience easy fatigability with exertion, but denies any associated chest pain. He feels "terrible" and "can't do nothing". His wife notes that he snores at night, and naps during the day. Patient has not had a formal evaluation for OSA.   Twelve-lead EKG today, reviewed by me, indicates AF 47 bpm  Allergies  Allergen Reactions  . Contrast Media (Iodinated Diagnostic Agents)     Affected kidneys (yellow dye, pigment blue)  . Dabigatran     REACTION: Bleeding-low HGB  . Penicillins     REACTION: swelling    Current Outpatient Prescriptions  Medication Sig Dispense Refill  . albuterol (PROVENTIL HFA;VENTOLIN HFA) 108 (90 BASE) MCG/ACT inhaler Inhale 2 puffs into the lungs as needed for wheezing.      . COD LIVER OIL PO Take 1 capsule by mouth daily.      . furosemide (LASIX) 40 MG tablet Take one tab by mouth every morning & 1/2 tab by mouth every evening      . guaiFENesin (MUCINEX) 600 MG 12 hr tablet Take 600 mg by mouth as needed.       Marland Kitchen LANTUS 100 UNIT/ML injection Inject 25 Units into the skin at bedtime.      Marland Kitchen lisinopril (PRINIVIL,ZESTRIL) 20 MG tablet Take 20 mg by mouth daily.      Marland Kitchen loratadine (CLARITIN) 10 MG tablet Take 10 mg by mouth as needed for allergies.      Marland Kitchen warfarin (COUMADIN) 2 MG tablet Take 2 mg by mouth daily. Managed by Dr. [name] (INR - 2.4 last 4/17)       No current facility-administered medications for this visit.    Past Medical History  Diagnosis Date  . Paroxysmal atrial fibrillation     s/p recurrent  cardioversion x2  . Hypertension   . Hyperlipidemia   . CHF (congestive heart failure)     s/p drug eluting stent to the diagonal 2007  . Chronic kidney disease     chronic renal insufficiency  . GI bleed 07/2010    with subsequent discontinuation of Pradaxa  . Right bundle branch block   . CVA (cerebral vascular accident) 09/2010    suspected embolic while off anticoagulation.  . Ejection fraction < 50%     Most recent ejection fraction 30-35%  . Coronary artery disease     Chronically occluded LAD, patent stent  to diagonal otherwise nonobstructive CAD    No past surgical history on file.  History   Social History  . Marital Status: Married    Spouse Name: [name]    Number of Children: N/A  . Years of Education: N/A   Occupational History  . RETIRED   .     Social History Main Topics  . Smoking status: Former Smoker -- 0.30 packs/day for 40 years    Types: Cigarettes    Quit date: 08/15/1969  . Smokeless tobacco: Never Used  . Alcohol Use: No  . Drug Use: Not on file  . Sexually Active: Not on file   Other Topics Concern  . Not on file   Social  History Narrative  . No narrative on file   Social History Narrative  . No narrative on file    Problem Relation Age of Onset  . Diabetes Other   . Hypertension Other     ROS: no nausea, vomiting; no fever, chills; no melena, hematochezia; no claudication  PHYSICAL EXAM: BP 138/55  Pulse 44  Ht 5\' 10"  (1.778 m)  Wt 218 lb (98.884 kg)  BMI 31.28 kg/m2  SpO2 99% GENERAL: 79 year-old male; NAD HEENT: NCAT, PERRLA, EOMI; sclera clear; no xanthelasma NECK: palpable bilateral carotid pulses, no bruits; no JVD; no TM LUNGS: CTA bilaterally CARDIAC: Irregularly irregular (S1, S2); no significant murmurs; no rubs or gallops ABDOMEN: soft, protuberant EXTREMETIES: no significant peripheral edema SKIN: warm/dry; no obvious rash/lesions MUSCULOSKELETAL: no joint deformity NEURO: no focal deficit; NL  affect   EKG: reviewed and available in Electronic Records   ASSESSMENT & PLAN:  Atrial fibrillation Despite the fact that we recently stopped Toprol, patient continues to complain of DOE and easy fatigability, and also demonstrates low HR by today's EKG, as well as by pulse. We'll order 48 hour Holter monitor to assess for chronotropic incompetence. We'll also check followup labs, including metabolic profile, CBC, TSH level. Patient has documented anemia during recent hospitalization, with history of GIB in 2011, on Xarelto. He is currently in Coumadin anticoagulation, and we will DC ASA.  CAD, NATIVE VESSEL Patient continues to report no CP, and recent troponins were normal. Will consider an ischemic evaluation with a stress test in the near future, if 48 hour Holter monitor is unrevealing as the etiology for the patient's symptoms.  RENAL INSUFFICIENCY, CHRONIC Followup labs will be ordered  UNSPECIFIED SLEEP APNEA Recommend formal evaluation for OSA, to be deferred to primary M.D.  Mitral regurgitation Assessed as mild/moderate by recent echocardiogram; EF 45-50% (unchanged since 10/2011)    Gene Pennye Beeghly, PAC

## 2012-12-03 NOTE — Assessment & Plan Note (Signed)
Followup labs will be ordered

## 2012-12-03 NOTE — Assessment & Plan Note (Signed)
Despite the fact that we recently stopped Toprol, patient continues to complain of DOE and easy fatigability, and also demonstrates low HR by today's EKG, as well as by pulse. We'll order 48 hour Holter monitor to assess for chronotropic incompetence. We'll also check followup labs, including metabolic profile, CBC, TSH level. Patient has documented anemia during recent hospitalization, with history of GIB in 2011, on Xarelto. He is currently in Coumadin anticoagulation, and we will DC ASA.

## 2012-12-03 NOTE — Assessment & Plan Note (Signed)
Assessed as mild/moderate by recent echocardiogram; EF 45-50% (unchanged since 10/2011)

## 2012-12-04 ENCOUNTER — Telehealth: Payer: Self-pay | Admitting: *Deleted

## 2012-12-04 DIAGNOSIS — R7989 Other specified abnormal findings of blood chemistry: Secondary | ICD-10-CM

## 2012-12-04 NOTE — Telephone Encounter (Signed)
Notes Recorded by Lesle Chris, LPN on 6/57/8469 at 4:57 PM Patient notified and verbalized understanding. Will fax lab order to [facility] now. He will go on 4/24.

## 2012-12-04 NOTE — Telephone Encounter (Signed)
Message copied by Lesle Chris on Tue Dec 04, 2012  4:57 PM ------      Message from: Rande Brunt      Created: Tue Dec 04, 2012 11:41 AM       Worsening renal fxn since 11/15/12. DC Lasix and Lisinopril. Ensure that pt is NOT taking supplemental K. Increase oral intake. Repeat BMET Thurs, 4/24, with RESULTS CALLED DIRECTLY TO OUR OFFICE. ------

## 2012-12-10 ENCOUNTER — Telehealth: Payer: Self-pay | Admitting: Physician Assistant

## 2012-12-10 NOTE — Telephone Encounter (Signed)
please call in reference to lab results. They are wanting to go out of town and need to know if it is ok.

## 2012-12-11 ENCOUNTER — Telehealth: Payer: Self-pay | Admitting: *Deleted

## 2012-12-11 DIAGNOSIS — R0602 Shortness of breath: Secondary | ICD-10-CM

## 2012-12-11 NOTE — Telephone Encounter (Signed)
Patient informed and now is c/o sob and edema in both feet since stopping the lasix. Please advise. Lab orders faxed to [facility].

## 2012-12-11 NOTE — Telephone Encounter (Signed)
Per Diona Browner and see lab result for details, patient to start furosemide 40 mg daily in am only. Patient informed and verbalized understanding of plan.

## 2012-12-11 NOTE — Telephone Encounter (Signed)
Message copied by Eustace Moore on Tue Dec 11, 2012  8:08 AM ------      Message from: Rande Brunt      Created: Fri Dec 07, 2012  1:51 PM       Much improved creatinine and K levels. Continue current medication regimen. F/u BMET/BNP in 5 days       ------

## 2012-12-11 NOTE — Telephone Encounter (Signed)
Carlye Grippe, LPN notified wife already.  See lab section.

## 2012-12-13 DIAGNOSIS — I428 Other cardiomyopathies: Secondary | ICD-10-CM

## 2012-12-13 DIAGNOSIS — R0602 Shortness of breath: Secondary | ICD-10-CM

## 2012-12-13 DIAGNOSIS — I498 Other specified cardiac arrhythmias: Secondary | ICD-10-CM

## 2012-12-13 DIAGNOSIS — I4891 Unspecified atrial fibrillation: Secondary | ICD-10-CM

## 2012-12-14 DIAGNOSIS — I2510 Atherosclerotic heart disease of native coronary artery without angina pectoris: Secondary | ICD-10-CM

## 2012-12-20 ENCOUNTER — Ambulatory Visit (INDEPENDENT_AMBULATORY_CARE_PROVIDER_SITE_OTHER): Payer: Medicare Other | Admitting: Cardiology

## 2012-12-20 ENCOUNTER — Encounter: Payer: Self-pay | Admitting: *Deleted

## 2012-12-20 ENCOUNTER — Encounter: Payer: Self-pay | Admitting: Cardiology

## 2012-12-20 ENCOUNTER — Other Ambulatory Visit: Payer: Self-pay | Admitting: *Deleted

## 2012-12-20 VITALS — BP 149/67 | HR 50 | Ht 70.0 in | Wt 215.0 lb

## 2012-12-20 DIAGNOSIS — R001 Bradycardia, unspecified: Secondary | ICD-10-CM

## 2012-12-20 DIAGNOSIS — I2510 Atherosclerotic heart disease of native coronary artery without angina pectoris: Secondary | ICD-10-CM

## 2012-12-20 DIAGNOSIS — D649 Anemia, unspecified: Secondary | ICD-10-CM

## 2012-12-20 DIAGNOSIS — I5022 Chronic systolic (congestive) heart failure: Secondary | ICD-10-CM

## 2012-12-20 DIAGNOSIS — I4891 Unspecified atrial fibrillation: Secondary | ICD-10-CM

## 2012-12-20 DIAGNOSIS — I059 Rheumatic mitral valve disease, unspecified: Secondary | ICD-10-CM

## 2012-12-20 DIAGNOSIS — I340 Nonrheumatic mitral (valve) insufficiency: Secondary | ICD-10-CM

## 2012-12-20 DIAGNOSIS — I498 Other specified cardiac arrhythmias: Secondary | ICD-10-CM

## 2012-12-20 DIAGNOSIS — N189 Chronic kidney disease, unspecified: Secondary | ICD-10-CM

## 2012-12-20 NOTE — Assessment & Plan Note (Signed)
Persistent/prominent. For now will continue Coumadin.

## 2012-12-20 NOTE — Progress Notes (Signed)
Clinical Summary Mr. Yera is an 79 y.o.male most recently seen in April by Mr. Serpe. This was in followup of hospitalization at [facility]. He had AF with bradycardia at that time, medications were adjusted including discontinuation of beta blocker. Subsequent 48 hour Holter monitor showed atrial fibrillation with ectopy and aberrant conduction, slowest bradycardia documented however this was largely nocturnal and he had no significant pauses.  He had a subsequent admission later in April due to cellulitis of the left second toe, treated with antibiotics. He was also noted to be bradycardic, cardiology was consulted due to possible symptomatic status, question of whether pacemaker would be indicated. He was seen by Dr. Antoine Poche and also Dr. Myrtis Ser. Based on record review was not felt that a device was indicated, however the patient was referred for further cardiac structural and ischemic evaluation.  Recent echocardiogram had demonstrated an LVEF of 45-50% with wall motion abnormalities consistent with ischemic heart disease, MAC with mild to moderate eccentric mitral regurgitation, aortic annular calcification with trace aortic regurgitation, trace tricuspid regurgitation with RVSP 35 mm mercury.  On May 2 he underwent a Lexiscan Cardiolite which demonstrated fairly large area of scar affecting the apex and inferolateral walls. There were no large ischemic territories, LVEF was calculated at 41%. Based on this medical therapy was recommended.  Of note, he also has other associated comorbidities including chronic kidney disease, recent creatinine 1.9 with GFR 34, anemia with hemoglobin of 8 on April 30, up to 9.5 on May 3. He will be seeing Dr. [name] soon for a routine visit, likely referred for GI evaluation and placed on iron supplements. He continues on Coumadin for now.  Patient presents today with his wife and daughter. They had multiple questions which I addressed after reviewing available  records. Mr. Lengacher has a number of complaints, most symptoms being intermittent. He still consistently feels fatigued particularly when he exerts himself, no specific angina. Has not been bothered by any palpitations recently. He denies any melena or hematochezia. I explained the likelihood that his symptoms are multifactorial, contributed to by differing comorbidities, and that we will likely not have a quick fix. One question remaining is whether he has chronotropic incompetence, versus rapid heart rate with activity that cannot be adequately controlled with medications due to his resting bradycardia. We talked about a GXT to assess this, and better help me understand whether a pacemaker would be indicated.   Allergies  Allergen Reactions  . Contrast Media (Iodinated Diagnostic Agents)     Affected kidneys (yellow dye, pigment blue)  . Dabigatran     REACTION: Bleeding-low HGB  . Penicillins     REACTION: swelling    Current Outpatient Prescriptions  Medication Sig Dispense Refill  . furosemide (LASIX) 40 MG tablet Take 40 mg by mouth daily.      Marland Kitchen guaiFENesin (MUCINEX) 600 MG 12 hr tablet Take 600 mg by mouth as needed.       Marland Kitchen LANTUS 100 UNIT/ML injection Inject 25 Units into the skin at bedtime.      Marland Kitchen loratadine (CLARITIN) 10 MG tablet Take 10 mg by mouth as needed for allergies.      . nitroGLYCERIN (NITROSTAT) 0.4 MG SL tablet Place 0.4 mg under the tongue every 5 (five) minutes as needed for chest pain.      Marland Kitchen sulfamethoxazole-trimethoprim (BACTRIM DS) 800-160 MG per tablet Take 1 tablet by mouth 2 (two) times daily.      Marland Kitchen warfarin (COUMADIN) 2 MG tablet Take  2 mg by mouth daily. Managed by Dr. [name] (INR - 2.4 last 4/17)       No current facility-administered medications for this visit.    Past Medical History  Diagnosis Date  . Paroxysmal atrial fibrillation     s/p recurrent cardioversion x2  . Hypertension   . Hyperlipidemia   . CHF (congestive heart failure)     s/p  drug eluting stent to the diagonal 2007  . Chronic kidney disease     chronic renal insufficiency  . GI bleed 07/2010    with subsequent discontinuation of Pradaxa  . Right bundle branch block   . CVA (cerebral vascular accident) 09/2010    suspected embolic while off anticoagulation.  . Ejection fraction < 50%     Most recent ejection fraction 30-35%  . Coronary artery disease     Chronically occluded LAD, patent stent  to diagonal otherwise nonobstructive CAD    Social History Mr. Mcclanahan reports that he quit smoking about 43 years ago. His smoking use included Cigarettes. He has a 12 pack-year smoking history. He has never used smokeless tobacco. Mr. Hartnett reports that he does not drink alcohol.  Review of Systems Reports occasional shakiness in his hands. NYHA class III dyspnea. No falls. Occasional unsteadiness. Occasional chest congestion. Insomnia. Otherwise negative.  Physical Examination Filed Vitals:   12/20/12 1507  BP: 149/67  Pulse: 50   Filed Weights   12/20/12 1507  Weight: 215 lb (97.523 kg)   No acute distress.  HEENT: Conjunctiva and lids normal, oropharynx clear.  Neck: Supple, no elevated JVP or carotid bruits, no thyromegaly.  Lungs: Clear to auscultation, diminished, nonlabored breathing at rest.  Cardiac: Irregularly irregular, no S3, indistinct PMI, soft systolic murmur, no pericardial rub.  Abdomen: Soft, nontender, protuberant, bowel sounds present, no guarding or rebound.  Extremities: Trace edema, distal pulses 2+.  Skin: Warm and dry. Muscular skeletal: No kyphosis. Neuropsychiatric: Alert and oriented x3.   Problem List and Plan   Symptomatic bradycardia Remains a concern. He is on no rate lowering medications having been taken off Toprol-XL previously. Heart rate around 50 today in atrial fibrillation. As noted above, we discussed several issues today. Our plan will be a GXT to assess for chronotropic incompetence, also exclude the  possibility of uncontrolled heart rate with activity. In either case, a pacemaker may ultimately be indicated. Followup arranged.  Atrial fibrillation Persistent/prominent. For now will continue Coumadin.  ANEMIA Recent hemoglobin as low as 8 as noted during recent hospitalization at [facility]. Patient will be seeing Dr. [name] next week, likely then referred for GI evaluation. He reports no melena or hematochezia.  Mitral regurgitation Mild to moderate by recent assessment, unlikely to be significantly contributing to his symptoms.  SYSTOLIC HEART FAILURE, CHRONIC LVEF 41% by recent Cardiolite, scar noted but no large ischemic territories. This would suggest continued medical therapy, no clear benefit from revascularization.  RENAL INSUFFICIENCY, CHRONIC Relatively stable, recent creatinine 1.9, has stage 3-4 disease.  CAD, NATIVE VESSEL Although has shortness of breath, has no specific angina symptoms. He has prior history of DES to the diagonal and occluded LAD.    Jonelle Sidle, M.D., F.A.C.C.

## 2012-12-20 NOTE — Assessment & Plan Note (Signed)
Relatively stable, recent creatinine 1.9, has stage 3-4 disease.

## 2012-12-20 NOTE — Assessment & Plan Note (Signed)
Remains a concern. He is on no rate lowering medications having been taken off Toprol-XL previously. Heart rate around 50 today in atrial fibrillation. As noted above, we discussed several issues today. Our plan will be a GXT to assess for chronotropic incompetence, also exclude the possibility of uncontrolled heart rate with activity. In either case, a pacemaker may ultimately be indicated. Followup arranged.

## 2012-12-20 NOTE — Assessment & Plan Note (Signed)
Mild to moderate by recent assessment, unlikely to be significantly contributing to his symptoms.

## 2012-12-20 NOTE — Assessment & Plan Note (Signed)
Recent hemoglobin as low as 8 as noted during recent hospitalization at [facility]. Patient will be seeing Dr. [name] next week, likely then referred for GI evaluation. He reports no melena or hematochezia.

## 2012-12-20 NOTE — Assessment & Plan Note (Signed)
Although has shortness of breath, has no specific angina symptoms. He has prior history of DES to the diagonal and occluded LAD.

## 2012-12-20 NOTE — Patient Instructions (Addendum)
Your physician recommends that you schedule a follow-up appointment in: 1 month. Your physician recommends that you continue on your current medications as directed. Please refer to the Current Medication list given to you today. Your physician has requested that you have an exercise tolerance test. For further information please visit [email or web address] Please also follow instruction sheet, as given.

## 2012-12-20 NOTE — Assessment & Plan Note (Signed)
LVEF 41% by recent Cardiolite, scar noted but no large ischemic territories. This would suggest continued medical therapy, no clear benefit from revascularization.

## 2012-12-21 ENCOUNTER — Telehealth: Payer: Self-pay | Admitting: *Deleted

## 2012-12-21 DIAGNOSIS — I2510 Atherosclerotic heart disease of native coronary artery without angina pectoris: Secondary | ICD-10-CM

## 2012-12-21 DIAGNOSIS — R001 Bradycardia, unspecified: Secondary | ICD-10-CM

## 2012-12-21 NOTE — Telephone Encounter (Signed)
Patient and wife informed. Patient request copy be sent to PCP. Vicky informed of referral.

## 2012-12-21 NOTE — Telephone Encounter (Signed)
Message copied by Eustace Moore on Fri Dec 21, 2012  4:29 PM ------      Message from: Jonelle Sidle      Created: Fri Dec 21, 2012  4:00 PM       Reviewed. Heart rate does not increase significantly with exercise, could be a component of his exertional dyspnea and fatigue. We did discuss the possibility of chronotropic incompetence in clinic yesterday. Please call and let him know. I am not certain that this means he needs a pacemaker, but I would have him seen by our EP team to help make this decision. ------

## 2012-12-27 ENCOUNTER — Encounter: Payer: Self-pay | Admitting: Internal Medicine

## 2012-12-27 NOTE — Telephone Encounter (Signed)
Needs to see Dr. Hillery Jacks asap. Can I add him on for the 22nd of May?

## 2012-12-27 NOTE — Telephone Encounter (Signed)
This encounter was created in error - please disregard.

## 2013-01-03 ENCOUNTER — Encounter: Payer: Self-pay | Admitting: Internal Medicine

## 2013-01-03 ENCOUNTER — Ambulatory Visit (INDEPENDENT_AMBULATORY_CARE_PROVIDER_SITE_OTHER): Payer: Medicare Other | Admitting: Internal Medicine

## 2013-01-03 VITALS — BP 148/61 | HR 57 | Ht 68.0 in | Wt 209.0 lb

## 2013-01-03 DIAGNOSIS — R001 Bradycardia, unspecified: Secondary | ICD-10-CM

## 2013-01-03 DIAGNOSIS — D649 Anemia, unspecified: Secondary | ICD-10-CM

## 2013-01-03 DIAGNOSIS — I498 Other specified cardiac arrhythmias: Secondary | ICD-10-CM

## 2013-01-03 DIAGNOSIS — I4891 Unspecified atrial fibrillation: Secondary | ICD-10-CM

## 2013-01-03 NOTE — Patient Instructions (Signed)
Continue all current medications. Follow up as needed  

## 2013-01-03 NOTE — Progress Notes (Signed)
Primary Care Physician: [name] Flavin, MD Referring Physician:  Dr [name] [name] [name] [name] is a 79 y.o. male with a h/o persistent atrial fibrillation, bradycardia, CRI, and anemia who presents for EP consultation.  [name] [name] [name]'s notes are reviewed in full detail.  He has had several recent hospitalizations during which he has been observed to have bradycardia.  He had a holter monitor placed which revealed average HR 51 with nocturnal bradycardia observed.  He had a GXT for which his exercise tolerance was very poor and therefore actually heart rate response to exercise could not be determined. He is referred for consideration of PPM at this time.  He has had no prolonged pauses and denies dizziness, presyncope, or syncope. His primary concern has been with SOB and exertional fatigue.  This past weekend, he was admitted with anemia and required 4u PRBCs (per patient and his family).  The source for her anemia has not been determined and the family reports that workup is ongoing with [name] [name] [name].  They report GI workup 1-2 years ago which was apparently uneventful.  Previous GI bleeding was attributed to pradaxa which was discontinued.  He reports that since receiving PRBCs this weekend that his exercise tolerance and fatigue have much improved.  He thinks that he is near his baseline at this point.  Today, he denies symptoms of palpitations, chest pain, dizziness, presyncope, syncope, or neurologic sequela. The patient is tolerating medications without difficulties and is otherwise without complaint today.   Past Medical History  Diagnosis Date  . Persistent atrial fibrillation     s/p recurrent cardioversion x2  . Hypertension   . Hyperlipidemia   . CHF (congestive heart failure)     s/p drug eluting stent to the diagonal 2007  . Chronic kidney disease   . GI bleed 07/2010    with subsequent discontinuation of Pradaxa  . Right bundle branch block   . CVA (cerebral vascular  accident) 09/2010    suspected embolic while off anticoagulation.  . Ejection fraction < 50%     Most recent ejection fraction 45-50%  . Coronary artery disease     Chronically occluded LAD, patent stent  to diagonal otherwise nonobstructive CAD   No past surgical history on file.  Current Outpatient Prescriptions  Medication Sig Dispense Refill  . Ascorbic Acid (VITAMIN C) 1000 MG tablet Take 1,000 mg by mouth daily.      . ferrous sulfate (FEOSOL) 325 (65 FE) MG tablet Take 325 mg by mouth daily.      . furosemide (LASIX) 40 MG tablet Take 40 mg by mouth daily.      Marland Kitchen guaiFENesin (MUCINEX) 600 MG 12 hr tablet Take 600 mg by mouth as needed.       Marland Kitchen LANTUS 100 UNIT/ML injection Inject 25 Units into the skin at bedtime.      Marland Kitchen loratadine (CLARITIN) 10 MG tablet Take 10 mg by mouth as needed for allergies.      . nitroGLYCERIN (NITROSTAT) 0.4 MG SL tablet Place 0.4 mg under the tongue every 5 (five) minutes as needed for chest pain.      Marland Kitchen warfarin (COUMADIN) 2 MG tablet Take 2 mg by mouth daily. Managed by [name]. Dimas [name] (INR - 2.4 last 4/17)       No current facility-administered medications for this visit.    Allergies  Allergen Reactions  . Contrast Media (Iodinated Diagnostic Agents)     Affected kidneys (yellow dye, pigment blue)  .  Dabigatran     REACTION: Bleeding-low HGB  . Penicillins     REACTION: swelling    History   Social History  . Marital Status: Married    Spouse Name: [name]    Number of Children: N/A  . Years of Education: N/A   Occupational History  . RETIRED   .     Social History Main Topics  . Smoking status: Former Smoker -- 0.30 packs/day for 40 years    Types: Cigarettes    Quit date: 08/15/1969  . Smokeless tobacco: Never Used  . Alcohol Use: No  . Drug Use: No  . Sexually Active: Not on file   Other Topics Concern  . Not on file   Social History Narrative  . No narrative on file    Family History  Problem Relation Age of Onset  .  Diabetes Other   . Hypertension Other     ROS- All systems are reviewed and negative except as per the HPI above  Physical Exam: Filed Vitals:   01/03/13 1438  BP: 148/61  Pulse: 57  Height: 5\' 8"  (1.727 m)  Weight: 209 lb (94.802 kg)    GEN- The patient is elderly appearing, alert and oriented x 3 today.   Head- normocephalic, atraumatic Eyes-  Sclera clear, conjunctiva pink Ears- hearing intact Oropharynx- clear Neck- supple, no JVP Lungs- Clear to ausculation bilaterally, normal work of breathing Heart- irregular rate and rhythm,   GI- soft, NT, ND, + BS Extremities- no clubbing, cyanosis, + edema MS- age appropriate muscle atrophy Skin- no rash or lesion Psych- euthymic mood, full affect Neuro- strength and sensation are intact  EKGs/ holter/ [name] [name] [name] notes/ GXT are all reviewed in EPIC  Assessment and Plan:  1. Bradycardia The patient has documented bradycardia though I am not convinced that this is the cause for his symptoms.  He actually feels much much better with recent PRBCs.  I therefore would advise treatment/ further workup of anemia as our primary strategy for treatment of symptoms at this time. Recent GXT was not very helpful as he only exercised for 1 minute.  This may have been complicated by anemia.   Once his anemia has been fully addressed, we could consider PPM if he has further symptoms, though I am not certain that he would receive clinical benefit. Though the patients family (primarily his wife) would push for any intervention with possible benefits, the patient appears to prefer a more conservative approach.  I think that managing expectations with his family will be necessary as he continues to age with multiple active comorbidites.  2. afib Continue long term anticoagulation if able  3. Anemia [name] [name] [name] to address  He will follow-up with [name] [name] and I will see as needed going forward.

## 2013-01-16 ENCOUNTER — Ambulatory Visit (INDEPENDENT_AMBULATORY_CARE_PROVIDER_SITE_OTHER): Payer: Medicare Other | Admitting: Cardiology

## 2013-01-16 ENCOUNTER — Encounter: Payer: Self-pay | Admitting: Cardiology

## 2013-01-16 VITALS — BP 150/56 | HR 37 | Ht 68.0 in | Wt 212.0 lb

## 2013-01-16 DIAGNOSIS — I4891 Unspecified atrial fibrillation: Secondary | ICD-10-CM

## 2013-01-16 DIAGNOSIS — R001 Bradycardia, unspecified: Secondary | ICD-10-CM

## 2013-01-16 DIAGNOSIS — I10 Essential (primary) hypertension: Secondary | ICD-10-CM

## 2013-01-16 DIAGNOSIS — D649 Anemia, unspecified: Secondary | ICD-10-CM

## 2013-01-16 DIAGNOSIS — I2510 Atherosclerotic heart disease of native coronary artery without angina pectoris: Secondary | ICD-10-CM

## 2013-01-16 DIAGNOSIS — I498 Other specified cardiac arrhythmias: Secondary | ICD-10-CM

## 2013-01-16 NOTE — Patient Instructions (Addendum)
Your physician recommends that you schedule a follow-up appointment in: 1 month. Your physician has recommended you make the following change in your medication: Please start lisinopril 10 mg daily. Please break your 20 mg tablet in 1/2 daily. Let our office know if you need a new prescription. All other medications will remain the same. Your physician recommends that you return for lab work in: about 2 weeks at [facility] for BMET.

## 2013-01-16 NOTE — Assessment & Plan Note (Signed)
Patient does state that he felt better after getting PRBCs, hemoglobin was over 10 at that point. He still however confirms that he is short of breath with activity, states that it is gradually worse over the last 6 months. I did ask him to keep follow up with Dr. [name] for further workup of his anemia from a GI perspective.

## 2013-01-16 NOTE — Assessment & Plan Note (Signed)
I reviewed today's ECG with Dr. Johney Frame by phone, discussed the patient's symptoms. Plan will be to have the patient evaluated for pacing, and Dr. Jenel Lucks office will be contacting him regarding scheduling.

## 2013-01-16 NOTE — Assessment & Plan Note (Signed)
He has prior history of DES to the diagonal and occluded LAD. Recent ischemic evaluation reviewed with plan for medical therapy.

## 2013-01-16 NOTE — Assessment & Plan Note (Signed)
Blood pressure is trending back up. We are going to put him back on lisinopril, although at 10 mg daily for now. Followup BMET in 2 weeks.

## 2013-01-16 NOTE — Progress Notes (Signed)
Clinical Summary [name] [name] is an 79 y.o.male presenting for followup. I saw him earlier in May and he had a subsequent evaluation by Dr. Johney Frame to assess for any potential of symptomatic bradycardia. He did undergo a limited GXT with inadequate increase in heart rate, although very limited exercise tolerance at the time. Dr. Johney Frame did not feel that a pacemaker was indicated at that time. Of note, the patient was also admitted to the hospital in the interim for packed red cell transfusion due to anemia, and reported feeling much better after that specific intervention. Hemoglobin was up to 10.2 on May 19.  He is here with his wife and daughter. He is somewhat of a difficult historian, sometimes indicates that he feels "great," but in the same sense will stay that he continues to be short of breath with activity, even just walking down the hall for a short distance outside. He has a general feeling of unsteadiness when he stands up, but no syncope. His wife corroborates these symptoms.  Today's ECG shows atrial fibrillation with right bundle block and left anterior fascicular block, heart rate 35 with probable escape rhythm. Home vital signs reviewed, he has had upward trend and systolic pressures from 150s to 170s, heart rates have steadily declined, recorded in the 40s and even 30s. He most likely is manifesting progressive conduction system disease, and I would think that this at least explain some of his symptomatology.  I explained to the patient and his family that I was going to contact Dr. Johney Frame and have him review the ECG from today, reconsider the possibility of pacing for symptomatically bradycardia. My hope is that this would help him at least to some degree in terms of described symptoms, although they do need to have realistic expectations and understand that he has comorbidities that may also be contributing.   Allergies  Allergen Reactions  . Contrast Media (Iodinated Diagnostic  Agents)     Affected kidneys (yellow dye, pigment blue)  . Dabigatran     REACTION: Bleeding-low HGB  . Penicillins     REACTION: swelling    Current Outpatient Prescriptions  Medication Sig Dispense Refill  . Ascorbic Acid (VITAMIN C) 1000 MG tablet Take 1,000 mg by mouth daily.      . ferrous sulfate (FEOSOL) 325 (65 FE) MG tablet Take 325 mg by mouth daily.      . furosemide (LASIX) 40 MG tablet Take 40 mg by mouth daily.      Marland Kitchen guaiFENesin (MUCINEX) 600 MG 12 hr tablet Take 600 mg by mouth as needed.       Marland Kitchen LANTUS 100 UNIT/ML injection Inject 25 Units into the skin at bedtime.      Marland Kitchen lisinopril (PRINIVIL,ZESTRIL) 10 MG tablet Take 10 mg by mouth daily.      Marland Kitchen loratadine (CLARITIN) 10 MG tablet Take 10 mg by mouth as needed for allergies.      . nitroGLYCERIN (NITROSTAT) 0.4 MG SL tablet Place 0.4 mg under the tongue every 5 (five) minutes as needed for chest pain.      Marland Kitchen warfarin (COUMADIN) 2 MG tablet Take 2 mg by mouth daily. Managed by Dr. [name] (INR - 2.4 last 4/17)       No current facility-administered medications for this visit.    Past Medical History  Diagnosis Date  . Persistent atrial fibrillation     Failed prior DCCV x 2  . Essential hypertension, benign   . Mixed hyperlipidemia   .  Chronic systolic heart failure   . CKD (chronic kidney disease) stage 3, GFR 30-59 ml/min   . GI bleed     December 20111 - subsequent discontinuation of Pradaxa  . Right bundle branch block   . CVA (cerebral vascular accident)     February 2013 - suspected embolic while off anticoagulation.  . 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 Bradycardia   . Cardiomyopathy     LVEF 45%    Social History [name] [name] reports that he quit smoking about 43 years ago. His smoking use included Cigarettes. He has a 12 pack-year smoking history. He has never used  smokeless tobacco. [name] [name] reports that he does not drink alcohol.  Review of Systems No cough, fevers or chills. No exertional chest pain. Has difficulty sleeping. No orthopnea. Stable appetite. Otherwise negative.  Physical Examination Filed Vitals:   01/16/13 1259  BP: 150/56  Pulse: 37   Filed Weights   01/16/13 1259  Weight: 212 lb (96.163 kg)    No acute distress.  HEENT: Conjunctiva and lids normal, oropharynx clear.  Neck: Supple, no elevated JVP or carotid bruits, no thyromegaly.  Lungs: Clear to auscultation, diminished, nonlabored breathing at rest.  Cardiac: Irregularly irregular, no S3, indistinct PMI, soft systolic murmur, no pericardial rub.  Abdomen: Soft, nontender, protuberant, bowel sounds present, no guarding or rebound.  Extremities: Trace edema, distal pulses 2+.  Skin: Warm and dry.  Musculoskeletal: No kyphosis.  Neuropsychiatric: Alert and oriented x3.   Problem List and Plan   Symptomatic bradycardia  I reviewed today's ECG with Dr. Johney Frame by phone, discussed the patient's symptoms. Plan will be to have the patient evaluated for pacing, and Dr. Jenel Lucks office will be contacting him regarding scheduling.  Essential hypertension, benign Blood pressure is trending back up. We are going to put him back on lisinopril, although at 10 mg daily for now. Followup BMET in 2 weeks.  ANEMIA Patient does state that he felt better after getting PRBCs, hemoglobin was over 10 at that point. He still however confirms that he is short of breath with activity, states that it is gradually worse over the last 6 months. I did ask him to keep follow up with Dr. [name] for further workup of his anemia from a GI perspective.  CAD, NATIVE VESSEL He has prior history of DES to the diagonal and occluded LAD. Recent ischemic evaluation reviewed with plan for medical therapy.     Jonelle Sidle, M.D., F.A.C.C.

## 2013-01-17 ENCOUNTER — Encounter (HOSPITAL_COMMUNITY): Payer: Self-pay | Admitting: Pharmacist

## 2013-01-17 DIAGNOSIS — Z7901 Long term (current) use of anticoagulants: Secondary | ICD-10-CM | POA: Diagnosis not present

## 2013-01-17 DIAGNOSIS — Z79899 Other long term (current) drug therapy: Secondary | ICD-10-CM | POA: Diagnosis not present

## 2013-01-17 DIAGNOSIS — Z006 Encounter for examination for normal comparison and control in clinical research program: Secondary | ICD-10-CM | POA: Diagnosis not present

## 2013-01-17 DIAGNOSIS — I441 Atrioventricular block, second degree: Secondary | ICD-10-CM | POA: Diagnosis not present

## 2013-01-17 DIAGNOSIS — I2510 Atherosclerotic heart disease of native coronary artery without angina pectoris: Secondary | ICD-10-CM | POA: Diagnosis not present

## 2013-01-17 DIAGNOSIS — E785 Hyperlipidemia, unspecified: Secondary | ICD-10-CM | POA: Diagnosis not present

## 2013-01-17 DIAGNOSIS — I498 Other specified cardiac arrhythmias: Secondary | ICD-10-CM | POA: Diagnosis present

## 2013-01-17 DIAGNOSIS — I4891 Unspecified atrial fibrillation: Secondary | ICD-10-CM | POA: Diagnosis not present

## 2013-01-17 DIAGNOSIS — I129 Hypertensive chronic kidney disease with stage 1 through stage 4 chronic kidney disease, or unspecified chronic kidney disease: Secondary | ICD-10-CM | POA: Diagnosis not present

## 2013-01-17 DIAGNOSIS — Z8673 Personal history of transient ischemic attack (TIA), and cerebral infarction without residual deficits: Secondary | ICD-10-CM | POA: Diagnosis not present

## 2013-01-17 DIAGNOSIS — Z9861 Coronary angioplasty status: Secondary | ICD-10-CM | POA: Diagnosis not present

## 2013-01-17 MED ORDER — VANCOMYCIN HCL IN DEXTROSE 1-5 GM/200ML-% IV SOLN
1000.0000 mg | INTRAVENOUS | Status: DC
  Filled 2013-01-17 (×2): qty 200

## 2013-01-18 ENCOUNTER — Encounter (HOSPITAL_COMMUNITY)
Admission: RE | Disposition: A | Discharge status: Home / Self Care | Payer: Self-pay | Source: Ambulatory Visit | Attending: Internal Medicine

## 2013-01-18 ENCOUNTER — Ambulatory Visit (HOSPITAL_COMMUNITY)
Admission: RE | Admit: 2013-01-18 | Discharge: 2013-01-19 | Disposition: A | Discharge status: Home / Self Care | Payer: Medicare Other | Source: Ambulatory Visit | Attending: Internal Medicine | Admitting: Internal Medicine

## 2013-01-18 ENCOUNTER — Encounter (HOSPITAL_COMMUNITY): Payer: Self-pay | Admitting: General Practice

## 2013-01-18 DIAGNOSIS — E785 Hyperlipidemia, unspecified: Secondary | ICD-10-CM | POA: Insufficient documentation

## 2013-01-18 DIAGNOSIS — Z79899 Other long term (current) drug therapy: Secondary | ICD-10-CM | POA: Insufficient documentation

## 2013-01-18 DIAGNOSIS — R001 Bradycardia, unspecified: Secondary | ICD-10-CM | POA: Diagnosis present

## 2013-01-18 DIAGNOSIS — Z006 Encounter for examination for normal comparison and control in clinical research program: Secondary | ICD-10-CM | POA: Insufficient documentation

## 2013-01-18 DIAGNOSIS — I4891 Unspecified atrial fibrillation: Secondary | ICD-10-CM | POA: Insufficient documentation

## 2013-01-18 DIAGNOSIS — I482 Chronic atrial fibrillation, unspecified: Secondary | ICD-10-CM | POA: Diagnosis present

## 2013-01-18 DIAGNOSIS — Z950 Presence of cardiac pacemaker: Secondary | ICD-10-CM

## 2013-01-18 DIAGNOSIS — I129 Hypertensive chronic kidney disease with stage 1 through stage 4 chronic kidney disease, or unspecified chronic kidney disease: Secondary | ICD-10-CM | POA: Insufficient documentation

## 2013-01-18 DIAGNOSIS — I2510 Atherosclerotic heart disease of native coronary artery without angina pectoris: Secondary | ICD-10-CM | POA: Insufficient documentation

## 2013-01-18 DIAGNOSIS — I441 Atrioventricular block, second degree: Secondary | ICD-10-CM

## 2013-01-18 DIAGNOSIS — I498 Other specified cardiac arrhythmias: Secondary | ICD-10-CM | POA: Insufficient documentation

## 2013-01-18 DIAGNOSIS — N183 Chronic kidney disease, stage 3 unspecified: Secondary | ICD-10-CM | POA: Insufficient documentation

## 2013-01-18 DIAGNOSIS — Z9861 Coronary angioplasty status: Secondary | ICD-10-CM | POA: Insufficient documentation

## 2013-01-18 DIAGNOSIS — Z7901 Long term (current) use of anticoagulants: Secondary | ICD-10-CM | POA: Insufficient documentation

## 2013-01-18 DIAGNOSIS — Z8673 Personal history of transient ischemic attack (TIA), and cerebral infarction without residual deficits: Secondary | ICD-10-CM | POA: Insufficient documentation

## 2013-01-18 HISTORY — PX: PERMANENT PACEMAKER INSERTION: SHX5480

## 2013-01-18 HISTORY — DX: Presence of cardiac pacemaker: Z95.0

## 2013-01-18 HISTORY — DX: Anemia, unspecified: D64.9

## 2013-01-18 HISTORY — DX: Type 2 diabetes mellitus without complications: E11.9

## 2013-01-18 HISTORY — DX: Unspecified osteoarthritis, unspecified site: M19.90

## 2013-01-18 HISTORY — DX: Personal history of other medical treatment: Z92.89

## 2013-01-18 HISTORY — PX: PACEMAKER INSERTION: SHX728

## 2013-01-18 LAB — BASIC METABOLIC PANEL
Calcium: 9.4 mg/dL (ref 8.4–10.5)
Creatinine, Ser: 1.84 mg/dL — ABNORMAL HIGH (ref 0.50–1.35)
GFR calc Af Amer: 38 mL/min — ABNORMAL LOW (ref >90)
Potassium: 4.6 mEq/L (ref 3.5–5.1)
Sodium: 138 mEq/L (ref 135–145)

## 2013-01-18 LAB — PROTIME-INR: Prothrombin Time: 19.2 seconds — ABNORMAL HIGH (ref 11.6–15.2)

## 2013-01-18 LAB — GLUCOSE, CAPILLARY: Glucose-Capillary: 145 mg/dL — ABNORMAL HIGH (ref 70–99)

## 2013-01-18 LAB — SURGICAL PCR SCREEN
MRSA, PCR: NEGATIVE
Staphylococcus aureus: NEGATIVE

## 2013-01-18 LAB — CBC
HCT: 40.4 % (ref 39.0–52.0)
Hemoglobin: 13.2 g/dL (ref 13.0–17.0)
MCHC: 32.7 g/dL (ref 30.0–36.0)
RBC: 4.61 MIL/uL (ref 4.22–5.81)
WBC: 7.5 10*3/uL (ref 4.0–10.5)

## 2013-01-18 SURGERY — PERMANENT PACEMAKER INSERTION
Anesthesia: LOCAL

## 2013-01-18 MED ORDER — MUPIROCIN 2 % EX OINT: TOPICAL_OINTMENT | Freq: Two times a day (BID) | CUTANEOUS | Status: DC

## 2013-01-18 MED ORDER — SODIUM CHLORIDE 0.9 % IV SOLN: INTRAVENOUS | Status: DC

## 2013-01-18 MED ORDER — SODIUM CHLORIDE 0.9 % IJ SOLN
3.0000 mL | Freq: Two times a day (BID) | INTRAMUSCULAR | Status: DC
  Administered 2013-01-18: 22:00:00 3 mL via INTRAVENOUS

## 2013-01-18 MED ORDER — INSULIN GLARGINE 100 UNIT/ML ~~LOC~~ SOLN
25.0000 [IU] | Freq: Every day | SUBCUTANEOUS | Status: DC
  Administered 2013-01-18: 25 [IU] via SUBCUTANEOUS
  Filled 2013-01-18 (×3): qty 0.25

## 2013-01-18 MED ORDER — BUPIVACAINE HCL (PF) 0.25 % IJ SOLN
INTRAMUSCULAR | Status: AC
  Filled 2013-01-18: qty 30

## 2013-01-18 MED ORDER — WARFARIN - PHYSICIAN DOSING INPATIENT: Freq: Every day | Status: DC

## 2013-01-18 MED ORDER — LISINOPRIL 5 MG PO TABS
5.0000 mg | ORAL_TABLET | Freq: Every day | ORAL | Status: DC
  Administered 2013-01-18 – 2013-01-19 (×2): 5 mg via ORAL
  Filled 2013-01-18 (×2): qty 1

## 2013-01-18 MED ORDER — SODIUM CHLORIDE 0.9 % IV SOLN: INTRAVENOUS | Status: AC

## 2013-01-18 MED ORDER — YOU HAVE A PACEMAKER BOOK
Freq: Once | Status: DC
  Filled 2013-01-18 (×2): qty 1

## 2013-01-18 MED ORDER — ACETAMINOPHEN 325 MG PO TABS: 650.0000 mg | ORAL_TABLET | ORAL | Status: DC | PRN

## 2013-01-18 MED ORDER — WARFARIN SODIUM 2 MG PO TABS
2.0000 mg | ORAL_TABLET | Freq: Every evening | ORAL | Status: DC
  Administered 2013-01-18: 2 mg via ORAL
  Filled 2013-01-18 (×2): qty 1

## 2013-01-18 MED ORDER — MUPIROCIN 2 % EX OINT
TOPICAL_OINTMENT | CUTANEOUS | Status: AC
  Filled 2013-01-18: qty 22

## 2013-01-18 MED ORDER — ONDANSETRON HCL 4 MG/2ML IJ SOLN: 4.0000 mg | Freq: Four times a day (QID) | INTRAMUSCULAR | Status: DC | PRN

## 2013-01-18 NOTE — H&P (Signed)
History and Physical    Patient ID: [name] [name] MRN: [id number], DOB/AGE: 77-Mar-1930 77 y.o.  Admit date: 01/18/2013  Primary Physician: Selinda Flavin, MD Primary Cardiologist: Simona Huh, MD  Reason for Consultation: symptomatic bradycardia  HPI:  Mr. Genet is a 79 year old male with a history of persistent atrial fibrillation, bradycardia, chronic renal insufficiency, and anemia who was referred for EP consultation. He has had several recent hospitalizations during which he has been observed to have bradycardia. He had a holter monitor placed which revealed average HR 51 with nocturnal bradycardia observed. He had a GXT for which his exercise tolerance was very poor and therefore actually heart rate response to exercise could not be determined.  His primary concern has been with SOB and exertional fatigue. Recently he was admitted with anemia and required 4u PRBCs (per patient and his family). The source for his anemia has not been determined and the family reports that workup is ongoing with Dr [name]. They report GI workup 1-2 years ago which was apparently uneventful. Previous GI bleeding was attributed to pradaxa which was discontinued.  The patient initially had an improvement in symptoms status post transfusion.  He was recently re-evalauted by Dr Diona Browner and was found to have a daytime heart rate of 37bpm associated with fatigue and exercise intolerance.  He was referred for evaluation of pacemaker implantation.    Past Medical History  Diagnosis Date  . Persistent atrial fibrillation     Failed prior DCCV x 2  . Essential hypertension, benign   . Mixed hyperlipidemia   . Chronic systolic heart failure   . CKD (chronic kidney disease) stage 3, GFR 30-59 ml/min   . GI bleed     December 20111 - subsequent discontinuation of Pradaxa  . Right bundle branch block   . CVA (cerebral vascular accident)     February 2013 - suspected embolic while off anticoagulation.  .  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 Bradycardia   . Cardiomyopathy     LVEF 45%     Surgical History: No past surgical history on file.   Prescriptions prior to admission  Medication Sig Dispense Refill  . Ascorbic Acid (VITAMIN C) 1000 MG tablet Take 1,000 mg by mouth daily.      . ferrous sulfate (FEOSOL) 325 (65 FE) MG tablet Take 325 mg by mouth daily.      . furosemide (LASIX) 40 MG tablet Take 40 mg by mouth every morning.       . GuaiFENesin (MUCINEX PO) Take 1 tablet by mouth daily as needed (allergies).      Marland Kitchen LANTUS 100 UNIT/ML injection Inject 25 Units into the skin at bedtime.      Marland Kitchen lisinopril (PRINIVIL,ZESTRIL) 10 MG tablet Take 5 mg by mouth daily.       Marland Kitchen loratadine (CLARITIN) 10 MG tablet Take 10 mg by mouth daily as needed for allergies.       . nitroGLYCERIN (NITROSTAT) 0.4 MG SL tablet Place 0.4 mg under the tongue every 5 (five) minutes as needed for chest pain.      Marland Kitchen warfarin (COUMADIN) 2 MG tablet Take 2 mg by mouth every evening. Managed by Dr. [name] (INR - 2.4 last 4/17)        Inpatient Medications:  . mupirocin ointment   Nasal BID  . mupirocin ointment      .  vancomycin  1,000 mg Intravenous On Call    Allergies:  Allergies  Allergen Reactions  . Contrast Media (Iodinated Diagnostic Agents) Other (See Comments)    Affected kidneys (yellow dye, pigment blue)  . Dabigatran Other (See Comments)    REACTION: Bleeding-low HGB  . Penicillins Swelling    Face swelling    History   Social History  . Marital Status: Married    Spouse Name: [name]    Number of Children: N/A  . Years of Education: N/A   Occupational History  . RETIRED   .     Social History Main Topics  . Smoking status: Former Smoker -- 0.30 packs/day for 40 years    Types: Cigarettes    Quit date: 08/15/1969  . Smokeless tobacco: Never Used  .  Alcohol Use: No  . Drug Use: No  . Sexually Active: Not on file    Family History  Problem Relation Age of Onset  . Diabetes Other   . Hypertension Other     Labs:   Lab Results  Component Value Date   WBC 7.5 01/18/2013   HGB 13.2 01/18/2013   HCT 40.4 01/18/2013   MCV 87.6 01/18/2013   PLT 124* 01/18/2013    Physical Exam: Filed Vitals:   01/18/13 1352  BP: 103/62  Pulse: 35  Temp: 97.3 F (36.3 C)  TempSrc: Oral  Resp: 18  Height: 5\' 8"  (1.727 m)  Weight: 209 lb (94.802 kg)  SpO2: 100%    GEN- The patient is elderly appearing, alert and oriented x 3 today.   Head- normocephalic, atraumatic Eyes-  Sclera clear, conjunctiva pink Ears- hearing intact Oropharynx- clear Neck- supple,  Lungs- Clear to ausculation bilaterally, normal work of breathing Heart- irregular rate and rhythm  GI- soft, NT, ND, + BS Extremities- no clubbing, cyanosis, + edema MS- no significant deformity or atrophy Skin- no rash or lesion Psych- euthymic mood, full affect Neuro- strength and sensation are intact  EKG: 01-17-2011 demonstrates atrial fibrillation with a ventricular rate of 35bpm and narrow QRS   Assessment and Plan:  1.  The patient has symptomatic bradycardia without reversible cause.  He has permanent afib also.  His EF is 45%.  He also has chronic renal failure.  Given significant symptoms of bradycardia, I would recommend pacemaker implantation at this time.  Risks, benefits, alternatives to pacemaker implantation were discussed in detail with the patient today.  A traditional as well as leadless pacemaker were discussed today.  He is clear that he would prefer a leadless device. The patient understands that the risks include but are not limited to bleeding, infection, pneumothorax, perforation, tamponade, vascular damage, renal failure, MI, stroke, death,  and device dislodgement and wishes to proceed. We will therefore schedule the procedure at this time.

## 2013-01-18 NOTE — Interval H&P Note (Signed)
History and Physical Interval Note:  01/18/2013 3:35 PM  [name] [name]  has presented today for surgery, with the diagnosis of Heart block  The various methods of treatment have been discussed with the patient and family. After consideration of risks, benefits and other options for treatment, the patient has consented to  Procedure(s): PERMANENT PACEMAKER INSERTION (N/A) as a surgical intervention .  The patient's history has been reviewed, patient examined, no change in status, stable for surgery.  I have reviewed the patient's chart and labs.  Questions were answered to the patient's satisfaction.     Hillis Range

## 2013-01-18 NOTE — Op Note (Addendum)
SURGEON: Hillis Range, MD  Assistant: Lewayne Bunting, MD   PREPROCEDURE DIAGNOSIS: 1. 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 Permanent Atrial fibrillation   POSTPROCEDURE DIAGNOSIS:  1. 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 Permanent Atrial fibrillation   PROCEDURES:  1. Right Ventriculogram.  2. Leadless Pacemaker implantation.   INTRODUCTION:  [name] [name] is a 79 y.o. male with a history of symptomatic bradycardia and permanent atrial fibrillation who presents today for pacemaker implantation.  During the case, he is observed to have alternating right and left bundle branch block with very regular RR intervals with afib consistent with mobitz II second degree AV block.  The patient therefore presents today for pacemaker implantation.   DESCRIPTION OF PROCEDURE: Informed written consent was obtained, and the patient was brought to the electrophysiology lab in a fasting state. The patient required no sedation for the procedure today. Using a percutaneous Seldinger technique, an 8-French was placed into the right common femoral vein.  A venogram of the right ventricle was performed by hand injection of nonionic contrast. This demonstrated a rather normal appearing right ventricle. The 8 french sheath was exchanged for an 18 french sheath. A St Jude Medical Nanostim Leadless Cardiac Pacemaker model S1DLCP ([id number] [id number]) was advanced through the right femoral vein into the right ventricular apex position and actively fixed to the myocardial wall.  In this location, R waves measured 4mV with an impedance of 1740 Ohms and a threshold of 1.75V@0 . . Stability of the device was demonstrated with a "tug test". The device was disconnected from the implantation apparatus which was then removed from the body and the sheaths were aspirated and flushed. The sheaths were removed and hemostasis was assured.  14ml of contrast was required for this procedure today. There were no early  apparent complications.   CONCLUSIONS:  1.Successful implantation of a SJM nannostim VVI investigational pacemaker.  2. No early apparent complications.   [name] Fearing Maybelline Kolarik,MD 5:53 PM 01/18/2013

## 2013-01-18 NOTE — Research (Signed)
LEADLESS II Informed Consent   Subject Name: [name] [name]  Subject met inclusion and exclusion criteria.  The informed consent form, study requirements and expectations were reviewed with the subject and questions and concerns were addressed prior to the signing of the consent form.  The subject verbalized understanding of the trail requirements.  The subject agreed to participate in the LEADLESS II trial and signed the informed consent.  The informed consent was obtained prior to performance of any protocol-specific procedures for the subject.  A copy of the signed informed consent was given to the subject and a copy was placed in the subject's medical record.  [name] 01/18/2013, 15:00PM

## 2013-01-18 NOTE — Progress Notes (Signed)
Short sleep apnea periods noted, O2 sat noted to drop as low as 86% when asleep, back up to 95 when awake. Lungs CTA. Placed on 2L per [place], will continue to monitor.

## 2013-01-19 ENCOUNTER — Ambulatory Visit (HOSPITAL_COMMUNITY): Payer: Medicare Other

## 2013-01-19 DIAGNOSIS — I441 Atrioventricular block, second degree: Secondary | ICD-10-CM | POA: Diagnosis not present

## 2013-01-19 LAB — BASIC METABOLIC PANEL
CO2: 22 mEq/L (ref 19–32)
Chloride: 105 mEq/L (ref 96–112)
Potassium: 4.5 mEq/L (ref 3.5–5.1)
Sodium: 138 mEq/L (ref 135–145)

## 2013-01-19 LAB — GLUCOSE, CAPILLARY: Glucose-Capillary: 79 mg/dL (ref 70–99)

## 2013-01-19 NOTE — Discharge Summary (Signed)
ELECTROPHYSIOLOGY PROCEDURE DISCHARGE SUMMARY    Patient ID: [name] [name],  MRN: [id number], DOB/AGE: [birth year] 77 y.o.  Admit date: 01/18/2013 Discharge date: 01/19/2013  Primary Care Physician: Selinda Flavin, MD Primary Cardiologist: Simona Huh, MD Electrophysiologist: Hillis Range, MD  Primary Discharge Diagnosis:  Symptomatic bradycardia status post pacemaker implant this admission  Secondary Discharge Diagnosis:  1.  Permanent atrial fibrillation 2.  Chronic anticoagulation with Warfarin- followed by Dr [name] 3.  Prior CVA in February 2013 4.  Hypertension 5.  Hyperlipidemia 6.  Chronic kidney disease- stage III 7.  Coronary artery disease- chronically occluded LAD, prior DES to diagnonal, subtotally occluded anomalous circumflex  Procedures This Admission:  1.  Implantation of a STJ Leadless cardiac pacemaker on 01-18-2013 by Dr Johney Frame.  The patient received a Nanostim Leadless pacemaker model number [id number].  There were no early complications.  2.  CXR on 01-19-2013 reviewed by Dr Ladona Ridgel demonstrated stable position of pacemaker.   Brief HPI: [name] [name] is a 79 year old male with a history of persistent atrial fibrillation, bradycardia, chronic renal insufficiency, and anemia who was referred for EP consultation. He has had several recent hospitalizations during which he has been observed to have bradycardia. He had a holter monitor placed which revealed average HR 51 with nocturnal bradycardia observed. He had a GXT for which his exercise tolerance was very poor and therefore actually heart rate response to exercise could not be determined. His primary concern has been with SOB and exertional fatigue. Recently he was admitted with anemia and required 4u PRBCs (per patient and his family). The source for his anemia has not been determined and the family reports that workup is ongoing with Dr [name]. They report GI workup 1-2 years ago which was apparently uneventful.  Previous GI bleeding was attributed to pradaxa which was discontinued. The patient initially had an improvement in symptoms status post transfusion. He was recently re-evalauted by Dr Diona Browner and was found to have a daytime heart rate of 37bpm associated with fatigue and exercise intolerance. He was referred for evaluation of pacemaker implantation.  The patient was evaluated for the Leadless II pacemaker study.  Risks, benefits, and alternatives were reviewed with the patient who wished to proceed.  Hospital Course:  The patient was admitted and underwent implantation of a Nanostim leadless cardiac pacemaker with details as outlined above.  He was monitored on telemetry overnight which demonstrated atrial fibrillation with ventricular pacing.  His groin incision was without complication.  His device was interrogated and found to be functioning normally.  Rate response was turned on at discharge.  CXR was obtained which demonstrated the device was in stable position.  He was evaluated by Dr Ladona Ridgel and considered stable for discharge.  He will resume his Coumadin at his previous home dose.  He has follow up scheduled next week with Dr [name] for INR check.   Discharge Vitals: Blood pressure 113/33, pulse 50, temperature 97.9 F (36.6 C), temperature source Oral, resp. rate 18, height 5\' 8"  (1.727 m), weight 220 lb 3.8 oz (99.9 kg), SpO2 98.00%.    Labs:   Lab Results  Component Value Date   WBC 7.5 01/18/2013   HGB 13.2 01/18/2013   HCT 40.4 01/18/2013   MCV 87.6 01/18/2013   PLT 124* 01/18/2013    Recent Labs Lab 01/19/13 0545  NA 138  K 4.5  CL 105  CO2 22  BUN 35*  CREATININE 1.59*  CALCIUM 9.2  GLUCOSE 100*  Discharge Medications:    Medication List    ASK your doctor about these medications       FEOSOL 325 (65 FE) MG tablet  Generic drug:  ferrous sulfate  Take 325 mg by mouth daily.     furosemide 40 MG tablet  Commonly known as:  LASIX  Take 40 mg by mouth every morning.      LANTUS 100 UNIT/ML injection  Generic drug:  insulin glargine  Inject 25 Units into the skin at bedtime.     lisinopril 10 MG tablet  Commonly known as:  PRINIVIL,ZESTRIL  Take 5 mg by mouth daily.     loratadine 10 MG tablet  Commonly known as:  CLARITIN  Take 10 mg by mouth daily as needed for allergies.     MUCINEX PO  Take 1 tablet by mouth daily as needed (allergies).     nitroGLYCERIN 0.4 MG SL tablet  Commonly known as:  NITROSTAT  Place 0.4 mg under the tongue every 5 (five) minutes as needed for chest pain.     vitamin C 1000 MG tablet  Take 1,000 mg by mouth daily.     warfarin 2 MG tablet  Commonly known as:  COUMADIN  Take 2 mg by mouth every evening. Managed by Dr. [name] (INR - 2.4 last 4/17)        Disposition:       Future Appointments Provider Department Dept Phone   01/31/2013 10:00 AM [facility] [employer] [employer] [facility] [place]) [phone]   02/19/2013 3:20 PM Jonelle Sidle, MD [address] (near [place]) [phone]       Duration of Discharge Encounter: Greater than 30 minutes including physician time.  Signed, Gypsy Balsam, RN, BSN 01/19/2013, 8:44 AM  EP Attending  Patient seen and examined. Agree with above. Ok for discharge home with usual followup.  Will keep his base rate at 50 and turn on rate response.   Leonia Reeves.D.

## 2013-01-31 ENCOUNTER — Ambulatory Visit (INDEPENDENT_AMBULATORY_CARE_PROVIDER_SITE_OTHER): Payer: Medicare Other | Admitting: *Deleted

## 2013-01-31 DIAGNOSIS — I498 Other specified cardiac arrhythmias: Secondary | ICD-10-CM

## 2013-01-31 DIAGNOSIS — R001 Bradycardia, unspecified: Secondary | ICD-10-CM

## 2013-01-31 LAB — PACEMAKER DEVICE OBSERVATION
BRDY-0002RV: 50 {beats}/min
BRDY-0003RV: 120 {beats}/min
DEVICE MODEL PM: 4248
RV LEAD THRESHOLD: 0.5 V

## 2013-01-31 MED ORDER — NITROGLYCERIN 0.4 MG SL SUBL: 0.4000 mg | SUBLINGUAL_TABLET | SUBLINGUAL | Status: DC | PRN

## 2013-01-31 NOTE — Progress Notes (Signed)
Wound check-PPM.  Device checked by industry for research. 

## 2013-02-19 ENCOUNTER — Ambulatory Visit (INDEPENDENT_AMBULATORY_CARE_PROVIDER_SITE_OTHER): Payer: Medicare Other | Admitting: Cardiology

## 2013-02-19 ENCOUNTER — Encounter: Payer: Self-pay | Admitting: Cardiology

## 2013-02-19 VITALS — BP 132/78 | HR 69 | Ht 68.5 in | Wt 206.1 lb

## 2013-02-19 DIAGNOSIS — I498 Other specified cardiac arrhythmias: Secondary | ICD-10-CM

## 2013-02-19 DIAGNOSIS — R001 Bradycardia, unspecified: Secondary | ICD-10-CM

## 2013-02-19 DIAGNOSIS — I4891 Unspecified atrial fibrillation: Secondary | ICD-10-CM

## 2013-02-19 DIAGNOSIS — I2510 Atherosclerotic heart disease of native coronary artery without angina pectoris: Secondary | ICD-10-CM

## 2013-02-19 NOTE — Assessment & Plan Note (Signed)
Permanent, continue anticoagulation.

## 2013-02-19 NOTE — Progress Notes (Signed)
Clinical Summary [name] [name] is a medically 79 y.o.male last seen in early June. At that time for him back to Dr. Johney Frame for discussion of pacemaker placement due to continued concerns about symptomatic bradycardia. He was diagnosed with Mobitz II second degree heart block and underwent a St. Jude leadless pacemaker implantation on 6/6 by Dr. Johney Frame. He had a recent wound check on 6/19.  He is here with his wife and daughter. Reports that breathing status is much improved. No dizziness. He reports compliance with his medications, otherwise no angina symptoms. Plans to travel to [place] with his wife later in the summer.  He will be seeing Dr. Johney Frame this month.   Allergies  Allergen Reactions  . Colcrys (Colchicine)     Skin irritation.   . Contrast Media (Iodinated Diagnostic Agents) Other (See Comments)    Affected kidneys (yellow dye, pigment blue)  . Dabigatran Other (See Comments)    REACTION: Bleeding-low HGB  . Penicillins Swelling    Face swelling    Current Outpatient Prescriptions  Medication Sig Dispense Refill  . Ascorbic Acid (VITAMIN C) 1000 MG tablet Take 1,000 mg by mouth daily.      . ferrous sulfate (FEOSOL) 325 (65 FE) MG tablet Take 325 mg by mouth daily.      . furosemide (LASIX) 40 MG tablet Take 40 mg by mouth every morning.       . GuaiFENesin (MUCINEX PO) Take 1 tablet by mouth daily as needed (allergies).      Marland Kitchen LANTUS 100 UNIT/ML injection Inject 25 Units into the skin at bedtime.      Marland Kitchen lisinopril (PRINIVIL,ZESTRIL) 10 MG tablet Take 5 mg by mouth daily.       Marland Kitchen loratadine (CLARITIN) 10 MG tablet Take 10 mg by mouth daily as needed for allergies.       . nitroGLYCERIN (NITROSTAT) 0.4 MG SL tablet Place 1 tablet (0.4 mg total) under the tongue every 5 (five) minutes as needed for chest pain.  30 tablet  0  . warfarin (COUMADIN) 2 MG tablet Take 2 mg by mouth every evening. Managed by Dr. [name] (INR - 2.4 last 4/17)       No current  facility-administered medications for this visit.    Past Medical History  Diagnosis Date  . Persistent atrial fibrillation     Failed prior DCCV x 2  . Essential hypertension, benign   . Mixed hyperlipidemia   . Chronic systolic heart failure   . GI bleed     December 20111 - subsequent discontinuation of Pradaxa  . Right bundle branch block   . 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 Bradycardia   . Cardiomyopathy     LVEF 45%  . Pacemaker   . Myocardial infarction 1998  . Pneumonia 1970's  . Type II diabetes mellitus   . Anemia   . History of blood transfusion 09/2010; 12/2012  . CVA (cerebral vascular accident) 09/2010    suspected embolic while off anticoagulation.  . Arthritis   . CKD (chronic kidney disease) stage 3, GFR 30-59 ml/min     Social History [name] [name] reports that he quit smoking about 43 years ago. His smoking use included Cigarettes. He has a 12 pack-year smoking history. He has never used smokeless tobacco. [name] [name] reports that he does not drink alcohol.  Review of Systems Complains  of leg fatigue and back pain, a chronic problem. Otherwise negative except as outlined.  Physical Examination Filed Vitals:   02/19/13 1530  BP: 132/78  Pulse: 69   Filed Weights   02/19/13 1530  Weight: 206 lb 1.9 oz (93.495 kg)    No acute distress.  HEENT: Conjunctiva and lids normal, oropharynx clear.  Neck: Supple, no elevated JVP or carotid bruits, no thyromegaly.  Lungs: Clear to auscultation, diminished, nonlabored breathing at rest.  Cardiac: Irregularly irregular, no S3, indistinct PMI, soft systolic murmur, no pericardial rub.  Abdomen: Soft, nontender, protuberant, bowel sounds present, no guarding or rebound.  Extremities: Trace edema, distal pulses 2+.  Skin: Warm and dry.  Musculoskeletal: No kyphosis.  Neuropsychiatric:  Alert and oriented x3.   Problem List and Plan   Symptomatic bradycardia Now status post placement of St. Jude leadless pacemaker with Mobitz II second-degree heart block. Maintain followup with Dr. Johney Frame. He is doing much better symptomatically at this time.  Atrial fibrillation Permanent, continue anticoagulation.  CAD, NATIVE VESSEL Continue medical therapy and observation. Six-month followup arranged.    Jonelle Sidle, M.D., F.A.C.C.

## 2013-02-19 NOTE — Assessment & Plan Note (Signed)
Now status post placement of St. Jude leadless pacemaker with Mobitz II second-degree heart block. Maintain followup with Dr. Johney Frame. He is doing much better symptomatically at this time.

## 2013-02-19 NOTE — Assessment & Plan Note (Signed)
Continue medical therapy and observation. Six-month followup arranged. 

## 2013-02-19 NOTE — Patient Instructions (Signed)
Continue all current medications. Your physician wants you to follow up in: 6 months.  You will receive a reminder letter in the mail one-two months in advance.  If you don't receive a letter, please call our office to schedule the follow up appointment   

## 2013-02-22 ENCOUNTER — Encounter (HOSPITAL_COMMUNITY): Payer: Self-pay | Admitting: *Deleted

## 2013-02-25 ENCOUNTER — Ambulatory Visit (INDEPENDENT_AMBULATORY_CARE_PROVIDER_SITE_OTHER): Payer: Medicare Other | Admitting: *Deleted

## 2013-02-25 DIAGNOSIS — I498 Other specified cardiac arrhythmias: Secondary | ICD-10-CM

## 2013-02-25 DIAGNOSIS — R001 Bradycardia, unspecified: Secondary | ICD-10-CM

## 2013-02-25 DIAGNOSIS — I4891 Unspecified atrial fibrillation: Secondary | ICD-10-CM

## 2013-02-25 LAB — PACEMAKER DEVICE OBSERVATION: RV LEAD AMPLITUDE: 8.5 mv

## 2013-02-25 NOTE — Progress Notes (Signed)
Leadless ppm in clinic. Normal function. ROV 04-26-13 @ 1030 with JA.

## 2013-02-27 ENCOUNTER — Encounter: Payer: Self-pay | Admitting: Internal Medicine

## 2013-03-01 ENCOUNTER — Encounter: Payer: Self-pay | Admitting: Internal Medicine

## 2013-03-23 ENCOUNTER — Encounter: Payer: Self-pay | Admitting: Physician Assistant

## 2013-03-24 ENCOUNTER — Encounter: Payer: Self-pay | Admitting: Physician Assistant

## 2013-03-24 DIAGNOSIS — I4891 Unspecified atrial fibrillation: Secondary | ICD-10-CM

## 2013-03-25 DIAGNOSIS — I4891 Unspecified atrial fibrillation: Secondary | ICD-10-CM

## 2013-03-26 ENCOUNTER — Telehealth: Payer: Self-pay | Admitting: Cardiology

## 2013-03-26 DIAGNOSIS — I2510 Atherosclerotic heart disease of native coronary artery without angina pectoris: Secondary | ICD-10-CM

## 2013-03-26 NOTE — Telephone Encounter (Signed)
[name] [name] should take Tylenol only as directed based on the appropriate dosing listed on the bottle. It is unlikely that Tylenol is at all related to his atrial fibrillation rate. Our service will followup on him tomorrow on rounds.

## 2013-03-26 NOTE — Telephone Encounter (Signed)
[name] [name] is Inpatient at [name] - his wife called the office because she forgot to mention to Dr.McDowell that she Is concerned about the over abundance of Tylenol 500mg  that [name] [name] has been taking for over a month now. She States that Dr. [name] told patient to take all of the tylenol he needs to take. [name] [name] is just wanting to know if Taking Tylenol could be causing his heart rate to stay elevated.    Please call her cell # [phone]

## 2013-03-27 NOTE — Telephone Encounter (Signed)
Patient's wife informed

## 2013-03-28 DIAGNOSIS — I4891 Unspecified atrial fibrillation: Secondary | ICD-10-CM

## 2013-03-29 ENCOUNTER — Telehealth: Payer: Self-pay | Admitting: *Deleted

## 2013-03-29 NOTE — Telephone Encounter (Signed)
Per wife, patient was d/c yesterday from [facility] and didn't have lisinopril listed on home medication list. Please advise if patient is to take this medication.

## 2013-03-29 NOTE — Telephone Encounter (Signed)
Patient informed. 

## 2013-03-29 NOTE — Telephone Encounter (Signed)
Recommend holding it until post hosptial f/u visit, at which time it may be resumed at previous low dose of 5 mg daily, if VSS.

## 2013-04-10 ENCOUNTER — Encounter: Payer: Self-pay | Admitting: Cardiology

## 2013-04-10 ENCOUNTER — Ambulatory Visit (INDEPENDENT_AMBULATORY_CARE_PROVIDER_SITE_OTHER): Payer: Medicare Other | Admitting: Cardiology

## 2013-04-10 VITALS — BP 146/74 | HR 54 | Ht 68.5 in | Wt 212.1 lb

## 2013-04-10 DIAGNOSIS — I498 Other specified cardiac arrhythmias: Secondary | ICD-10-CM

## 2013-04-10 DIAGNOSIS — R001 Bradycardia, unspecified: Secondary | ICD-10-CM

## 2013-04-10 DIAGNOSIS — I4891 Unspecified atrial fibrillation: Secondary | ICD-10-CM

## 2013-04-10 DIAGNOSIS — I2510 Atherosclerotic heart disease of native coronary artery without angina pectoris: Secondary | ICD-10-CM

## 2013-04-10 MED ORDER — METOPROLOL TARTRATE 50 MG PO TABS: 75.0000 mg | ORAL_TABLET | Freq: Two times a day (BID) | ORAL | Status: DC

## 2013-04-10 NOTE — Assessment & Plan Note (Signed)
History of Mobitz 2 second-degree heart block status post St. Jude leadless pacemaker. Keep followup device interrogation visit with Dr. Johney Frame over the next few weeks.

## 2013-04-10 NOTE — Assessment & Plan Note (Signed)
For now we will continue Coumadin, reduce Lopressor to 75 mg twice daily, and continue Cardizem CD 120 mg daily.

## 2013-04-10 NOTE — Patient Instructions (Addendum)
Your physician recommends that you schedule a follow-up appointment in: 3 months. Your physician has recommended you make the following change in your medication: Decrease your metoprolol 75 mg to twice daily. All other medications will remain the same.

## 2013-04-10 NOTE — Assessment & Plan Note (Signed)
No active angina symptoms. Continue medical therapy. 

## 2013-04-10 NOTE — Progress Notes (Signed)
Clinical Summary [name] [name] is a medically complex 79 y.o.male last seen in the office back in July. At that time he was doing well status post St. Jude leadless pacemaker with history of symptomatic bradycardia due to Mobitz 2 second-degree heart block with baseline atrial fibrillation. Heart rate was in the 60s.  He was admitted to [facility] in August with rapid atrial fibrillation up to 130 -140s and underwent medication adjustments with cardiology consultation for rate control. He was continued on Coumadin. Cardiac markers argued against ACS.  He presents today with his wife. States he feels better, somewhat fatigued. Heart rate has been in the 50s most recently on the current regimen. He will be seeing Dr. Johney Frame within the next few weeks for device interrogation. Otherwise no new symptoms.   Allergies  Allergen Reactions  . Colcrys [Colchicine]     Skin irritation.   . Contrast Media [Iodinated Diagnostic Agents] Other (See Comments)    Affected kidneys (yellow dye, pigment blue)  . Dabigatran Other (See Comments)    REACTION: Bleeding-low HGB  . Penicillins Swelling    Face swelling    Current Outpatient Prescriptions  Medication Sig Dispense Refill  . Ascorbic Acid (VITAMIN C) 1000 MG tablet Take 1,000 mg by mouth daily.      Marland Kitchen diltiazem (CARDIZEM CD) 120 MG 24 hr capsule Take 120 mg by mouth daily.      . ferrous sulfate (FEOSOL) 325 (65 FE) MG tablet Take 325 mg by mouth daily.      . furosemide (LASIX) 40 MG tablet Take 20 mg by mouth every morning.       Marland Kitchen LANTUS 100 UNIT/ML injection Inject 25 Units into the skin at bedtime.      . metoprolol (LOPRESSOR) 50 MG tablet Take 1.5 tablets (75 mg total) by mouth 2 (two) times daily.  90 tablet  6  . nitroGLYCERIN (NITROSTAT) 0.4 MG SL tablet Place 1 tablet (0.4 mg total) under the tongue every 5 (five) minutes as needed for chest pain.  30 tablet  0  . warfarin (COUMADIN) 2 MG tablet Take 2 mg by mouth every evening. Managed by  Dr. [name] (INR - 2.4 last 4/17)       No current facility-administered medications for this visit.    Past Medical History  Diagnosis Date  . Persistent atrial fibrillation     Failed prior DCCV x 2  . Essential hypertension, benign   . Mixed hyperlipidemia   . Chronic systolic heart failure   . GI bleed     December 2011 - subsequent discontinuation of Pradaxa  . Right bundle branch block   . 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 Bradycardia   . Cardiomyopathy     LVEF 45%  . Pacemaker 01/18/2013    STJ Nanostim Leadless pacemaker implanted by Dr Johney Frame  . Myocardial infarction 1998  . Pneumonia 1970's  . Type II diabetes mellitus   . Anemia   . History of blood transfusion 09/2010; 12/2012  . CVA (cerebral vascular accident) 09/2010    Suspected embolic while off anticoagulation.  . Arthritis   . CKD (chronic kidney disease) stage 3, GFR 30-59 ml/min     Past Surgical History  Procedure Laterality Date  . Pacemaker insertion  01/18/2013    STJ Nanostim Leadless pacemaker implanted by Dr Johney Frame  . Lumbar disc surgery  2000's  .  Coronary angioplasty  1998  . Coronary angioplasty with stent placement  2007    "1" (01/18/2013)  . Cystectomy  1951    "off my neck" (01/18/2013)    Social History Mr. Kunert reports that he quit smoking about 43 years ago. His smoking use included Cigarettes. He has a 12 pack-year smoking history. He has never used smokeless tobacco. Mr. Hartt reports that he does not drink alcohol.  Review of Systems No bleeding episodes. Otherwise negative.  Physical Examination Filed Vitals:   04/10/13 0842  BP: 146/74  Pulse: 54   Filed Weights   04/10/13 0842  Weight: 212 lb 1.9 oz (96.217 kg)    No acute distress.  HEENT: Conjunctiva and lids normal, oropharynx clear.  Neck: Supple, no elevated JVP or carotid bruits, no  thyromegaly.  Lungs: Clear to auscultation, diminished, nonlabored breathing at rest.  Cardiac: Irregularly irregular, no S3, indistinct PMI, soft systolic murmur, no pericardial rub.  Abdomen: Soft, nontender, protuberant, bowel sounds present, no guarding or rebound.  Extremities: Trace edema, distal pulses 2+.  Skin: Warm and dry.  Musculoskeletal: No kyphosis.  Neuropsychiatric: Alert and oriented x3.   Problem List and Plan   Atrial fibrillation For now we will continue Coumadin, reduce Lopressor to 75 mg twice daily, and continue Cardizem CD 120 mg daily.  Symptomatic bradycardia History of Mobitz 2 second-degree heart block status post St. Jude leadless pacemaker. Keep followup device interrogation visit with Dr. Johney Frame over the next few weeks.  CAD, NATIVE VESSEL No active angina symptoms. Continue medical therapy.    Jonelle Sidle, M.D., F.A.C.C.

## 2013-04-26 ENCOUNTER — Encounter: Payer: Medicare Other | Admitting: Cardiology

## 2013-04-30 ENCOUNTER — Encounter: Payer: Self-pay | Admitting: Cardiology

## 2013-04-30 ENCOUNTER — Ambulatory Visit (INDEPENDENT_AMBULATORY_CARE_PROVIDER_SITE_OTHER): Payer: Medicare Other | Admitting: Cardiology

## 2013-04-30 VITALS — BP 151/98 | HR 60 | Ht 68.5 in | Wt 212.0 lb

## 2013-04-30 DIAGNOSIS — R001 Bradycardia, unspecified: Secondary | ICD-10-CM

## 2013-04-30 DIAGNOSIS — I4891 Unspecified atrial fibrillation: Secondary | ICD-10-CM

## 2013-04-30 DIAGNOSIS — I498 Other specified cardiac arrhythmias: Secondary | ICD-10-CM

## 2013-04-30 DIAGNOSIS — Z950 Presence of cardiac pacemaker: Secondary | ICD-10-CM

## 2013-04-30 LAB — PACEMAKER DEVICE OBSERVATION
BRDY-0002RV: 50 {beats}/min
BRDY-0004RV: 120 {beats}/min
RV LEAD THRESHOLD: 0.25 V
VENTRICULAR PACING PM: 80

## 2013-04-30 NOTE — Patient Instructions (Addendum)
KEEP APPOINTMENT WITH DR.ALLRED

## 2013-05-01 NOTE — Progress Notes (Signed)
ELECTROPHYSIOLOGY OFFICE NOTE  Patient ID: [name] [name] MRN: [id number], DOB/AGE: 77-May-1930   Date of Visit: 04/30/2013  Primary Physician: Selinda Flavin, MD Primary Cardiologist / EP: Diona Browner, MD / Johney Frame, MD Reason for Visit: EP/device follow-up  History of Present Illness  [name] [name] is a 79 y.o. male with permanent AF and symptomatic bradycardia s/p leadless Nanostim pacemaker implant June 2014 who presents today for routine 64-month electrophysiology followup. Since last being seen in our clinic, he reports he is doing well and has no complaints. He has chronic intermittent chest pain which he states is very different from his previous angina. He states he was recently evaluated at [facility] last month at which time a stress test was done. Unfortunately I do not have those records at this time. He tells me the stress test was negative. He denies worsening chest pain. He denies shortness of breath. He denies palpitations, dizziness, near syncope or syncope. He denies LE swelling, orthopnea, PND or recent weight gain. He is compliant and tolerating medications without difficulty.  Past Medical History Past Medical History  Diagnosis Date  . Persistent atrial fibrillation     Failed prior DCCV x 2  . Essential hypertension, benign   . Mixed hyperlipidemia   . Chronic systolic heart failure   . GI bleed     December 2011 - subsequent discontinuation of Pradaxa  . Right bundle branch block   . 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 Bradycardia   . Cardiomyopathy     LVEF 45%  . Pacemaker 01/18/2013    STJ Nanostim Leadless pacemaker implanted by Dr Johney Frame  . Myocardial infarction 1998  . Pneumonia 1970's  . Type II diabetes mellitus   . Anemia   . History of blood transfusion 09/2010; 12/2012  . CVA (cerebral vascular accident)  09/2010    Suspected embolic while off anticoagulation.  . Arthritis   . CKD (chronic kidney disease) stage 3, GFR 30-59 ml/min     Past Surgical History Past Surgical History  Procedure Laterality Date  . Pacemaker insertion  01/18/2013    STJ Nanostim Leadless pacemaker implanted by Dr Johney Frame  . Lumbar disc surgery  2000's  . Coronary angioplasty  1998  . Coronary angioplasty with stent placement  2007    "1" (01/18/2013)  . Cystectomy  1951    "off my neck" (01/18/2013)    Allergies/Intolerances Allergies  Allergen Reactions  . Colcrys [Colchicine]     Skin irritation.   . Contrast Media [Iodinated Diagnostic Agents] Other (See Comments)    Affected kidneys (yellow dye, pigment blue)  . Dabigatran Other (See Comments)    REACTION: Bleeding-low HGB  . Penicillins Swelling    Face swelling   Current Home Medications Current Outpatient Prescriptions  Medication Sig Dispense Refill  . Ascorbic Acid (VITAMIN C) 1000 MG tablet Take 1,000 mg by mouth daily.      Marland Kitchen diltiazem (CARDIZEM CD) 120 MG 24 hr capsule Take 120 mg by mouth daily.      . ferrous sulfate (FEOSOL) 325 (65 FE) MG tablet Take 325 mg by mouth daily.      . furosemide (LASIX) 40 MG tablet Take 20 mg by mouth every morning.       Marland Kitchen LANTUS 100 UNIT/ML injection Inject 25 Units into the skin at bedtime.      . nitroGLYCERIN (NITROSTAT)  0.4 MG SL tablet Place 1 tablet (0.4 mg total) under the tongue every 5 (five) minutes as needed for chest pain.  30 tablet  0  . warfarin (COUMADIN) 2 MG tablet Take 2 mg by mouth every evening. Managed by Dr. [name] (INR - 2.4 last 4/17)       No current facility-administered medications for this visit.   Social History Social History  . Marital Status: Married   Social History Main Topics  . Smoking status: Former Smoker -- 0.30 packs/day for 40 years    Types: Cigarettes    Quit date: 08/15/1969  . Smokeless tobacco: Never Used  . Alcohol Use: No  . Drug Use: No   Review of  Systems General: No chills, fever, night sweats or weight changes Cardiovascular: No chest pain, dyspnea on exertion, edema, orthopnea, palpitations, paroxysmal nocturnal dyspnea Dermatological: No rash, lesions or masses Respiratory: No cough, dyspnea Urologic: No hematuria, dysuria Abdominal: No nausea, vomiting, diarrhea, bright red blood per rectum, melena, or hematemesis Neurologic: No visual changes, weakness, changes in mental status All other systems reviewed and are otherwise negative except as noted above.  Physical Exam Vitals: Blood pressure 151/98, pulse 60, height 5' 8.5" (1.74 m), weight 212 lb (96.163 kg), SpO2 100.00%.  General: Well developed, well appearing 79 y.o. male in no acute distress. HEENT: Normocephalic, atraumatic. EOMs intact. Sclera nonicteric. Oropharynx clear.  Neck: Supple without bruits. No JVD. Lungs: Respirations regular and unlabored, CTA bilaterally. No wheezes, rales or rhonchi. Heart: RRR. S1, S2 present. No murmurs, rub, S3 or S4. Abdomen: Soft, non-distended.  Extremities: No clubbing, cyanosis or edema. PT/Radials 2+ and equal bilaterally. Psych: Normal affect. Neuro: Alert and oriented X 3. Moves all extremities spontaneously.   Diagnostics Device interrogation performed by industry for research - normal pacemaker function with good battery status and stable lead measurements; histograms appropriate; see PaceArt report for full details   Assessment and Plan  1. Symptomatic bradycardia s/p LCP implant  - Normal pacemaker function  - See Pace Art report  - No changes today  - Return to clinic for follow-up with Dr. Johney Frame in 3 months per protocol  2. Paroxysmal atrial fibrillation  - Stable  - Continue diltiazem for rate control  - Continue anticoagulation with warfarin  Signed, Karisha Marlin, PA-C 05/01/2013, 10:16 PM

## 2013-05-02 ENCOUNTER — Encounter (HOSPITAL_COMMUNITY): Payer: Self-pay | Admitting: Pharmacy Technician

## 2013-05-02 ENCOUNTER — Inpatient Hospital Stay (HOSPITAL_COMMUNITY)
Admission: AD | Admit: 2013-05-02 | Discharge: 2013-05-04 | DRG: 247 | Disposition: A | Discharge status: Home / Self Care | Payer: Medicare Other | Source: Other Acute Inpatient Hospital | Attending: Cardiology | Admitting: Cardiology

## 2013-05-02 DIAGNOSIS — I509 Heart failure, unspecified: Secondary | ICD-10-CM | POA: Diagnosis present

## 2013-05-02 DIAGNOSIS — I2510 Atherosclerotic heart disease of native coronary artery without angina pectoris: Secondary | ICD-10-CM | POA: Diagnosis present

## 2013-05-02 DIAGNOSIS — Z7901 Long term (current) use of anticoagulants: Secondary | ICD-10-CM

## 2013-05-02 DIAGNOSIS — I428 Other cardiomyopathies: Secondary | ICD-10-CM | POA: Diagnosis present

## 2013-05-02 DIAGNOSIS — Z794 Long term (current) use of insulin: Secondary | ICD-10-CM

## 2013-05-02 DIAGNOSIS — I4891 Unspecified atrial fibrillation: Secondary | ICD-10-CM

## 2013-05-02 DIAGNOSIS — I214 Non-ST elevation (NSTEMI) myocardial infarction: Principal | ICD-10-CM | POA: Diagnosis present

## 2013-05-02 DIAGNOSIS — Z7982 Long term (current) use of aspirin: Secondary | ICD-10-CM

## 2013-05-02 DIAGNOSIS — I5042 Chronic combined systolic (congestive) and diastolic (congestive) heart failure: Secondary | ICD-10-CM | POA: Diagnosis present

## 2013-05-02 DIAGNOSIS — Z7902 Long term (current) use of antithrombotics/antiplatelets: Secondary | ICD-10-CM

## 2013-05-02 DIAGNOSIS — E785 Hyperlipidemia, unspecified: Secondary | ICD-10-CM | POA: Diagnosis present

## 2013-05-02 DIAGNOSIS — I10 Essential (primary) hypertension: Secondary | ICD-10-CM | POA: Diagnosis present

## 2013-05-02 DIAGNOSIS — I129 Hypertensive chronic kidney disease with stage 1 through stage 4 chronic kidney disease, or unspecified chronic kidney disease: Secondary | ICD-10-CM | POA: Diagnosis present

## 2013-05-02 DIAGNOSIS — I252 Old myocardial infarction: Secondary | ICD-10-CM

## 2013-05-02 DIAGNOSIS — Z950 Presence of cardiac pacemaker: Secondary | ICD-10-CM

## 2013-05-02 DIAGNOSIS — N183 Chronic kidney disease, stage 3 unspecified: Secondary | ICD-10-CM | POA: Diagnosis present

## 2013-05-02 DIAGNOSIS — E119 Type 2 diabetes mellitus without complications: Secondary | ICD-10-CM | POA: Diagnosis present

## 2013-05-02 DIAGNOSIS — Z888 Allergy status to other drugs, medicaments and biological substances status: Secondary | ICD-10-CM

## 2013-05-02 DIAGNOSIS — Q245 Malformation of coronary vessels: Secondary | ICD-10-CM

## 2013-05-02 DIAGNOSIS — Z833 Family history of diabetes mellitus: Secondary | ICD-10-CM

## 2013-05-02 DIAGNOSIS — I5022 Chronic systolic (congestive) heart failure: Secondary | ICD-10-CM

## 2013-05-02 DIAGNOSIS — Z880 Allergy status to penicillin: Secondary | ICD-10-CM

## 2013-05-02 DIAGNOSIS — Z8673 Personal history of transient ischemic attack (TIA), and cerebral infarction without residual deficits: Secondary | ICD-10-CM

## 2013-05-02 DIAGNOSIS — I495 Sick sinus syndrome: Secondary | ICD-10-CM | POA: Diagnosis present

## 2013-05-02 DIAGNOSIS — Z8249 Family history of ischemic heart disease and other diseases of the circulatory system: Secondary | ICD-10-CM

## 2013-05-02 DIAGNOSIS — I482 Chronic atrial fibrillation, unspecified: Secondary | ICD-10-CM | POA: Diagnosis present

## 2013-05-02 DIAGNOSIS — Z91041 Radiographic dye allergy status: Secondary | ICD-10-CM

## 2013-05-02 DIAGNOSIS — I2582 Chronic total occlusion of coronary artery: Secondary | ICD-10-CM | POA: Diagnosis present

## 2013-05-02 DIAGNOSIS — E782 Mixed hyperlipidemia: Secondary | ICD-10-CM | POA: Diagnosis present

## 2013-05-02 DIAGNOSIS — Z87891 Personal history of nicotine dependence: Secondary | ICD-10-CM

## 2013-05-02 DIAGNOSIS — Z9861 Coronary angioplasty status: Secondary | ICD-10-CM

## 2013-05-02 HISTORY — DX: Ischemic cardiomyopathy: I25.5

## 2013-05-02 HISTORY — DX: Personal history of pneumonia (recurrent): Z87.01

## 2013-05-02 LAB — HEPARIN LEVEL (UNFRACTIONATED): Heparin Unfractionated: 0.21 IU/mL — ABNORMAL LOW (ref 0.30–0.70)

## 2013-05-02 MED ORDER — FERROUS SULFATE 325 (65 FE) MG PO TABS
325.0000 mg | ORAL_TABLET | Freq: Every day | ORAL | Status: DC
  Administered 2013-05-02 – 2013-05-04 (×3): 325 mg via ORAL
  Filled 2013-05-02 (×6): qty 1

## 2013-05-02 MED ORDER — METOPROLOL TARTRATE 50 MG PO TABS
75.0000 mg | ORAL_TABLET | Freq: Two times a day (BID) | ORAL | Status: DC
  Administered 2013-05-02 – 2013-05-03 (×3): 75 mg via ORAL
  Filled 2013-05-02 (×6): qty 1

## 2013-05-02 MED ORDER — SODIUM CHLORIDE 0.9 % IJ SOLN: 3.0000 mL | INTRAMUSCULAR | Status: DC | PRN

## 2013-05-02 MED ORDER — SODIUM CHLORIDE 0.9 % IV SOLN: INTRAVENOUS | Status: DC

## 2013-05-02 MED ORDER — ASPIRIN 81 MG PO CHEW
324.0000 mg | CHEWABLE_TABLET | ORAL | Status: AC
  Administered 2013-05-03: 324 mg via ORAL
  Filled 2013-05-02: qty 4

## 2013-05-02 MED ORDER — ACETAMINOPHEN 325 MG PO TABS: 650.0000 mg | ORAL_TABLET | ORAL | Status: DC | PRN

## 2013-05-02 MED ORDER — NITROGLYCERIN 0.4 MG SL SUBL: 0.4000 mg | SUBLINGUAL_TABLET | SUBLINGUAL | Status: DC | PRN

## 2013-05-02 MED ORDER — INSULIN ASPART 100 UNIT/ML ~~LOC~~ SOLN
0.0000 [IU] | Freq: Every day | SUBCUTANEOUS | Status: DC
  Administered 2013-05-03: 22:00:00 3 [IU] via SUBCUTANEOUS

## 2013-05-02 MED ORDER — SODIUM CHLORIDE 0.9 % IV SOLN: 250.0000 mL | INTRAVENOUS | Status: DC | PRN

## 2013-05-02 MED ORDER — HEPARIN (PORCINE) IN NACL 100-0.45 UNIT/ML-% IJ SOLN
1400.0000 [IU]/h | INTRAMUSCULAR | Status: DC
  Administered 2013-05-02: 1100 [IU]/h via INTRAVENOUS
  Administered 2013-05-03: 1400 [IU]/h via INTRAVENOUS
  Filled 2013-05-02 (×3): qty 250

## 2013-05-02 MED ORDER — SODIUM CHLORIDE 0.9 % IJ SOLN
3.0000 mL | Freq: Two times a day (BID) | INTRAMUSCULAR | Status: DC
  Administered 2013-05-02 – 2013-05-03 (×2): 3 mL via INTRAVENOUS

## 2013-05-02 MED ORDER — INSULIN ASPART 100 UNIT/ML ~~LOC~~ SOLN
0.0000 [IU] | Freq: Three times a day (TID) | SUBCUTANEOUS | Status: DC
  Administered 2013-05-03 – 2013-05-04 (×4): 3 [IU] via SUBCUTANEOUS

## 2013-05-02 MED ORDER — PREDNISONE 50 MG PO TABS
60.0000 mg | ORAL_TABLET | ORAL | Status: AC
  Administered 2013-05-02: 60 mg via ORAL
  Filled 2013-05-02: qty 1

## 2013-05-02 MED ORDER — FUROSEMIDE 20 MG PO TABS
20.0000 mg | ORAL_TABLET | Freq: Every morning | ORAL | Status: DC
  Administered 2013-05-03 – 2013-05-04 (×2): 20 mg via ORAL
  Filled 2013-05-02 (×2): qty 1

## 2013-05-02 MED ORDER — SODIUM CHLORIDE 0.9 % IV SOLN
INTRAVENOUS | Status: DC
  Administered 2013-05-03: 75 mL/h via INTRAVENOUS

## 2013-05-02 MED ORDER — ASPIRIN EC 81 MG PO TBEC
81.0000 mg | DELAYED_RELEASE_TABLET | Freq: Every day | ORAL | Status: DC
  Administered 2013-05-03 – 2013-05-04 (×2): 81 mg via ORAL
  Filled 2013-05-02 (×2): qty 1

## 2013-05-02 MED ORDER — PREDNISONE 50 MG PO TABS
60.0000 mg | ORAL_TABLET | ORAL | Status: AC
  Administered 2013-05-03: 06:00:00 60 mg via ORAL
  Filled 2013-05-02: qty 1

## 2013-05-02 NOTE — Progress Notes (Signed)
Dr Swaziland and PA currently on floor see pt.  Awaiting new orders.  Will continue to monitor.  Amanda Pea, Charity fundraiser.

## 2013-05-02 NOTE — Progress Notes (Signed)
ANTICOAGULATION CONSULT NOTE - Initial Consult  Pharmacy Consult for heparin Indication: chest pain/ACS  Allergies  Allergen Reactions  . Colcrys [Colchicine]     Skin irritation.   . Contrast Media [Iodinated Diagnostic Agents] Other (See Comments)    Affected kidneys (yellow dye, pigment blue)  . Dabigatran Other (See Comments)    REACTION: Bleeding-low HGB  . Penicillins Swelling    Face swelling    Patient Measurements: Height: 5\' 10"  (177.8 cm) Weight: 205 lb 4 oz (93.1 kg) (scale c) IBW/kg (Calculated) : 73 Heparin Dosing Weight: 91 kg  Vital Signs: Temp: 97.4 F (36.3 C) (09/18 1453) Temp src: Oral (09/18 1453) BP: 106/45 mmHg (09/18 1453) Pulse Rate: 66 (09/18 1453)  Labs: No results found for this basename: HGB, HCT, PLT, APTT, LABPROT, INR, HEPARINUNFRC, CREATININE, CKTOTAL, CKMB, TROPONINI,  in the last 72 hours  Estimated Creatinine Clearance: 41.7 ml/min (by C-G formula based on Cr of 1.59).   Medical History: Past Medical History  Diagnosis Date  . Persistent atrial fibrillation     Failed prior DCCV x 2  . Essential hypertension, benign   . Mixed hyperlipidemia   . Chronic systolic heart failure   . GI bleed     December 2011 - subsequent discontinuation of Pradaxa  . Right bundle branch block   . 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 Bradycardia   . Cardiomyopathy     LVEF 45%  . Pacemaker 01/18/2013    STJ Nanostim Leadless pacemaker implanted by Dr Johney Frame  . Myocardial infarction 1998  . Pneumonia 1970's  . Type II diabetes mellitus   . Anemia   . History of blood transfusion 09/2010; 12/2012  . CVA (cerebral vascular accident) 09/2010    Suspected embolic while off anticoagulation.  . Arthritis   . CKD (chronic kidney disease) stage 3, GFR 30-59 ml/min     Medications:  Prescriptions prior to admission   Medication Sig Dispense Refill  . Ascorbic Acid (VITAMIN C) 1000 MG tablet Take 1,000 mg by mouth daily.      . ferrous sulfate (FEOSOL) 325 (65 FE) MG tablet Take 325 mg by mouth daily.      . furosemide (LASIX) 40 MG tablet Take 20 mg by mouth every morning.       Marland Kitchen guaiFENesin (MUCINEX) 600 MG 12 hr tablet Take 600 mg by mouth 2 (two) times daily as needed for congestion.      Marland Kitchen LANTUS 100 UNIT/ML injection Inject 25 Units into the skin at bedtime.      . metoprolol tartrate (LOPRESSOR) 25 MG tablet Take 75 mg by mouth 2 (two) times daily.      . nitroGLYCERIN (NITROSTAT) 0.4 MG SL tablet Place 1 tablet (0.4 mg total) under the tongue every 5 (five) minutes as needed for chest pain.  30 tablet  0  . warfarin (COUMADIN) 2 MG tablet Take 2 mg by mouth every evening. Managed by Dr. [name] (INR - 2.4 last 4/17)        Assessment: 79 year old man transferred from [facility] hospital ED on heparin for evaluation of chest pain.  Heparin is running at 1080 units/hr and was started at approximately 1240.  He is on warfarin for AFib [facility] hospital labs:  INR 1.8, Scr 1.65  Goal of Therapy:  Heparin level 0.3-0.7 units/ml Monitor platelets by anticoagulation protocol: Yes  Plan:  Continue heparin drip - round up to 1100 units/hr.  Check a heparin level at 2100 today and adjust per goal.  Daily CBC and heparin level while on heparin.  Mickeal Skinner 05/02/2013,5:32 PM

## 2013-05-02 NOTE — H&P (Signed)
CARDIOLOGY ADMISSION HISTORY & PHYSICAL  Patient ID: [name] [name] MRN: [id number], DOB/AGE: 77-26-1930   Date of Admission: 05/02/2013  Primary Physician: Selinda Flavin, MD Primary Cardiologist / EP: Diona Browner, MD / Johney Frame, MD Reason for Admission: Chest pain  History of Present Illness [name] [name] is a 79 y.o. male with CAD (chronically occluded LAD, patent DES diagonal, subtotally occluded anomalous Cx with failed prior PCI), LVEF 45%, CKD, prior CVA, HTN, DM, permanent AF and symptomatic bradycardia s/p leadless Nanostim pacemaker implant June 2014 who has been accepted in transfer from [facility] for further evaluation of chest pain.   [name] [name] has had intermittent chest pain but reports it acutely worsened on Tuesday evening. He describes right sided chest discomfort that radiates to his right shoulder. He denies SOB, palpitations, nausea, diaphoresis, dizziness or syncope. Late Tuesday night he had an episode that was more severe and persistent which prompted him to take him SL NTG. He only took 1 dose. He states the pain subsided after 30 minutes or so. He contacted our office to report worsening chest pain and decided to go to the [facility] ED. ECG shows V paced rhythm with underlying AF at 65 bpm.   Of note, he presented with similar symptoms back in April 2012 at which time he had chest pain and a positive troponin of 3.5. He underwent cardiac cath which revealed a chronically occluded LAD, patent DES diagonal, subtotally occluded anomalous Cx with failed prior PCI. His symptoms and positive troponin were felt to be related to rapid AF.  Past Medical History Past Medical History  Diagnosis Date  . Persistent atrial fibrillation     Failed prior DCCV x 2  . Essential hypertension, benign   . Mixed hyperlipidemia   . Chronic systolic heart failure   . GI bleed     December 2011 - subsequent discontinuation of Pradaxa  . Right bundle branch block     . 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 Bradycardia   . Cardiomyopathy     LVEF 45%  . Pacemaker 01/18/2013    STJ Nanostim Leadless pacemaker implanted by Dr Johney Frame  . Myocardial infarction 1998  . Pneumonia 1970's  . Type II diabetes mellitus   . Anemia   . History of blood transfusion 09/2010; 12/2012  . CVA (cerebral vascular accident) 09/2010    Suspected embolic while off anticoagulation.  . Arthritis   . CKD (chronic kidney disease) stage 3, GFR 30-59 ml/min     Past Surgical History Past Surgical History  Procedure Laterality Date  . Pacemaker insertion  01/18/2013    STJ Nanostim Leadless pacemaker implanted by Dr Johney Frame  . Lumbar disc surgery  2000's  . Coronary angioplasty  1998  . Coronary angioplasty with stent placement  2007    "1" (01/18/2013)  . Cystectomy  1951    "off my neck" (01/18/2013)    Allergies/Intolerances Allergies  Allergen Reactions  . Colcrys [Colchicine]     Skin irritation.   . Contrast Media [Iodinated Diagnostic Agents] Other (See Comments)    Affected kidneys (yellow dye, pigment blue)  . Dabigatran Other (See Comments)    REACTION: Bleeding-low HGB  . Penicillins Swelling    Face swelling   Home Medications Medications Prior to Admission  Medication Sig Dispense Refill  . Ascorbic Acid (VITAMIN C) 1000 MG tablet Take 1,000 mg  by mouth daily.      . ferrous sulfate (FEOSOL) 325 (65 FE) MG tablet Take 325 mg by mouth daily.      . furosemide (LASIX) 40 MG tablet Take 20 mg by mouth every morning.       Marland Kitchen guaiFENesin (MUCINEX) 600 MG 12 hr tablet Take 600 mg by mouth 2 (two) times daily as needed for congestion.      Marland Kitchen LANTUS 100 UNIT/ML injection Inject 25 Units into the skin at bedtime.      . metoprolol tartrate (LOPRESSOR) 25 MG tablet Take 75 mg by mouth 2 (two) times daily.      . nitroGLYCERIN (NITROSTAT) 0.4  MG SL tablet Place 1 tablet (0.4 mg total) under the tongue every 5 (five) minutes as needed for chest pain.  30 tablet  0  . warfarin (COUMADIN) 2 MG tablet Take 2 mg by mouth every evening. Managed by Dr. [name] (INR - 2.4 last 4/17)       Family History Family History  Problem Relation Age of Onset  . Diabetes Other   . Hypertension Other     Social History History   Social History  . Marital Status: Married    Spouse Name: [name]    Number of Children: N/A  . Years of Education: N/A   Occupational History  . RETIRED   .     Social History Main Topics  . Smoking status: Former Smoker -- 0.30 packs/day for 40 years    Types: Cigarettes    Quit date: 08/15/1969  . Smokeless tobacco: Never Used  . Alcohol Use: No  . Drug Use: No  . Sexual Activity: No   Other Topics Concern  . Not on file   Social History Narrative  . No narrative on file     Review of Systems General: No chills, fever, night sweats or weight changes.  Cardiovascular: No chest pain, dyspnea on exertion, edema, orthopnea, palpitations, paroxysmal nocturnal dyspnea. Dermatological: No rash, lesions or masses. Respiratory: No cough, dyspnea. Urologic: No hematuria, dysuria. Abdominal: No nausea, vomiting, diarrhea, bright red blood per rectum, melena, or hematemesis. Neurologic: No visual changes, weakness, changes in mental status. All other systems reviewed and are otherwise negative except as noted above.  Physical Exam Vitals: Blood pressure 106/45, pulse 66, temperature 97.4 F (36.3 C), temperature source Oral, resp. rate 18, height 5\' 10"  (1.778 m), weight 205 lb 4 oz (93.1 kg), SpO2 100.00%.  General: Well developed, well appearing 79 y.o. male in no acute distress. HEENT: Normocephalic, atraumatic. EOMs intact. Sclera nonicteric. Oropharynx clear.  Neck: Supple without bruits. JVD is elevated. Lungs: Respirations regular and unlabored, CTA bilaterally. No wheezes, rales or rhonchi. Heart:  RRR. S1, S2 present. No murmurs, rub, S3 or S4. Abdomen: Soft, non-tender, non-distended. BS present x 4 quadrants. No hepatosplenomegaly.  Extremities: No clubbing or cyanosis. Trace-1+ edema. DP/PT/Radials 2+ and equal bilaterally. Psych: Normal affect. Neuro: Alert and oriented X 3. Moves all extremities spontaneously. Musculoskeletal: No kyphosis. Skin: Intact. Warm and dry. No rashes or petechiae in exposed areas.   Labs From [facility] - WBC 7500, Hgb 13, Hct 37.7, Plt 139,000 Sodium 134  Potassium 4.6  Chloride 105  Bicarb 23  BUN 33  Cr 1.65  Mg 1.8 AST 27  ALT 23  Alk phos 82 INR 1.8 BNP 500 CK 184  CK-MB 9.1  Troponin 0.06 => 0.24  Radiology/Studies No results found.  Most recent cardiac catheterization April 2012 ASSESSMENT:  1.  Coronary artery disease with chronic total occlusion of the left  anterior descending.  2. Previously placed diagonal stent is patent.  3. The right coronary artery has minimal plaque.  4. 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 This is slightly worse than previous, but not amenable  to percutaneous coronary intervention.  5. Atrial fibrillation with rapid ventricular response, not responding  to diltiazem.  PLAN/DISCUSSION: I suspect [name] [name]'s are symptoms mostly related to  his atrial fibrillation with rapid ventricular response.   12-lead ECG from [facility] - V paced with underlying AFib Telemetry - AF with V pacing  Assessment and Plan 1. NSTEMI 2. Known CAD as outlined above 3. Tachy-brady syndrome s/p leadless Nanostim pacemaker implant 4. Permanent AFib 5. CKD, stage III 6. HTN 7. DM  [name] [name] presents with chest pain and mildly elevated troponin. We have discussed options with [name] [name] and his family in detail and have recommended definitive evaluation with a cardiac catheterization to rule out CAD progression. We will hydrate him prior to the cath given his renal  dysfunction. Of note, as outlined above, he presented with similar symptoms in 2012 and cardiac cath showed stable CAD. His symptoms and elevated troponin were felt to be related to rapid AF. We will have his pacemaker interrogated to determine his rates have been well controlled.   Dr. Swaziland to see. Please see recommendations below. Signed, Rick Duff, PA-C 05/02/2013, 3:12 PM  Patient seen and examined and history reviewed. Agree with above findings and plan. [name] [name] presents with increasing right chest pain radiating into right shoulder. Troponins at [facility] are positive for NSTEMI. Ecg shows a paced rhythm. Patient has apparently been having some elevated HRs with afib. This could be a possible explanation for his presentation. It has been 2.5 years since his last cardiac cath so a new lesion is also a possibility. He has chronic systolic CHF with mild volume overload. We will interrogate his pacer to assess his HR response. If elevated may need to increase his beta blocker dose. Will hold coumadin in anticipation of cardiac cath tomorrow. Will prehydrate starting at 4 am and also pretreat for contrast allergy with prednisone. Discussed at length with patient and family.  Theron Arista [facility] 05/02/2013 5:32 PM

## 2013-05-02 NOTE — Progress Notes (Signed)
ANTICOAGULATION CONSULT NOTE - Initial Consult  Pharmacy Consult for heparin Indication: chest pain/ACS  Allergies  Allergen Reactions  . Colcrys [Colchicine]     Skin irritation.   . Contrast Media [Iodinated Diagnostic Agents] Other (See Comments)    Affected kidneys (yellow dye, pigment blue)  . Dabigatran Other (See Comments)    REACTION: Bleeding-low HGB  . Penicillins Swelling    Face swelling    Patient Measurements: Height: 5\' 10"  (177.8 cm) Weight: 205 lb 4 oz (93.1 kg) (scale c) IBW/kg (Calculated) : 73 Heparin Dosing Weight: 91 kg  Vital Signs: Temp: 97.6 F (36.4 C) (09/18 2037) Temp src: Oral (09/18 2037) BP: 117/56 mmHg (09/18 2037) Pulse Rate: 73 (09/18 2037)  Labs:  Recent Labs  05/02/13 1834 05/02/13 2100  HEPARINUNFRC  --  0.21*  TROPONINI 1.21*  --     Estimated Creatinine Clearance: 41.7 ml/min (by C-G formula based on Cr of 1.59).  Assessment: 79 year old man transferred from [facility] hospital ED on heparin for evaluation of chest pain.  Heparin is running at 1080 units/hr and was started at approximately 1240.  He is on warfarin for AFib [facility] hospital labs:  INR 1.8, Scr 1.65. First heparin level is 0.21 - below goal of 0.3-0.7  Goal of Therapy:  Heparin level 0.3-0.7 units/ml Monitor platelets by anticoagulation protocol: Yes   Plan:  Increase heparin to 1400 units/hr.   Check heparin level in AM.  Mickeal Skinner 05/02/2013,9:50 PM

## 2013-05-03 ENCOUNTER — Encounter (HOSPITAL_COMMUNITY)
Admission: AD | Disposition: A | Discharge status: Home / Self Care | Payer: Medicare Other | Source: Other Acute Inpatient Hospital | Attending: Cardiology

## 2013-05-03 ENCOUNTER — Ambulatory Visit (HOSPITAL_COMMUNITY): Admit: 2013-05-03 | Payer: Self-pay | Admitting: Cardiology

## 2013-05-03 DIAGNOSIS — I2510 Atherosclerotic heart disease of native coronary artery without angina pectoris: Secondary | ICD-10-CM

## 2013-05-03 DIAGNOSIS — I214 Non-ST elevation (NSTEMI) myocardial infarction: Secondary | ICD-10-CM

## 2013-05-03 HISTORY — PX: LEFT HEART CATHETERIZATION WITH CORONARY ANGIOGRAM: SHX5451

## 2013-05-03 LAB — CBC
HCT: 37.9 % — ABNORMAL LOW (ref 39.0–52.0)
Hemoglobin: 13.1 g/dL (ref 13.0–17.0)
MCH: 32.9 pg (ref 26.0–34.0)
MCHC: 34.6 g/dL (ref 30.0–36.0)

## 2013-05-03 LAB — GLUCOSE, CAPILLARY
Glucose-Capillary: 165 mg/dL — ABNORMAL HIGH (ref 70–99)
Glucose-Capillary: 191 mg/dL — ABNORMAL HIGH (ref 70–99)

## 2013-05-03 LAB — BASIC METABOLIC PANEL
BUN: 30 mg/dL — ABNORMAL HIGH (ref 6–23)
Calcium: 9.3 mg/dL (ref 8.4–10.5)
Creatinine, Ser: 1.51 mg/dL — ABNORMAL HIGH (ref 0.50–1.35)
GFR calc Af Amer: 48 mL/min — ABNORMAL LOW (ref >90)
GFR calc non Af Amer: 42 mL/min — ABNORMAL LOW (ref >90)
Potassium: 5.2 mEq/L — ABNORMAL HIGH (ref 3.5–5.1)

## 2013-05-03 LAB — TROPONIN I
Troponin I: 0.9 ng/mL
Troponin I: 0.9 ng/mL

## 2013-05-03 LAB — PROTIME-INR: Prothrombin Time: 19.5 seconds — ABNORMAL HIGH (ref 11.6–15.2)

## 2013-05-03 LAB — HEPARIN LEVEL (UNFRACTIONATED): Heparin Unfractionated: 0.23 IU/mL — ABNORMAL LOW (ref 0.30–0.70)

## 2013-05-03 SURGERY — LEFT HEART CATHETERIZATION WITH CORONARY ANGIOGRAM
Anesthesia: LOCAL

## 2013-05-03 MED ORDER — VERAPAMIL HCL 2.5 MG/ML IV SOLN
INTRAVENOUS | Status: AC
  Filled 2013-05-03: qty 2

## 2013-05-03 MED ORDER — BIVALIRUDIN 250 MG IV SOLR
INTRAVENOUS | Status: AC
  Filled 2013-05-03: qty 250

## 2013-05-03 MED ORDER — NITROGLYCERIN 0.2 MG/ML ON CALL CATH LAB
INTRAVENOUS | Status: AC
  Filled 2013-05-03: qty 1

## 2013-05-03 MED ORDER — LIDOCAINE HCL (PF) 1 % IJ SOLN
INTRAMUSCULAR | Status: AC
  Filled 2013-05-03: qty 30

## 2013-05-03 MED ORDER — WARFARIN SODIUM 3 MG PO TABS
3.0000 mg | ORAL_TABLET | Freq: Once | ORAL | Status: AC
  Administered 2013-05-03: 3 mg via ORAL
  Filled 2013-05-03: qty 1

## 2013-05-03 MED ORDER — CLOPIDOGREL BISULFATE 300 MG PO TABS
ORAL_TABLET | ORAL | Status: AC
  Filled 2013-05-03: qty 2

## 2013-05-03 MED ORDER — CLOPIDOGREL BISULFATE 75 MG PO TABS
75.0000 mg | ORAL_TABLET | Freq: Every day | ORAL | Status: DC
  Administered 2013-05-04: 09:00:00 75 mg via ORAL
  Filled 2013-05-03 (×2): qty 1

## 2013-05-03 MED ORDER — SODIUM CHLORIDE 0.9 % IV SOLN
1.0000 mL/kg/h | INTRAVENOUS | Status: AC
  Administered 2013-05-03: 1 mL/kg/h via INTRAVENOUS

## 2013-05-03 MED ORDER — HEPARIN (PORCINE) IN NACL 2-0.9 UNIT/ML-% IJ SOLN
INTRAMUSCULAR | Status: AC
  Filled 2013-05-03: qty 1000

## 2013-05-03 MED ORDER — WARFARIN - PHARMACIST DOSING INPATIENT: Freq: Every day | Status: DC

## 2013-05-03 MED ORDER — HEPARIN SODIUM (PORCINE) 1000 UNIT/ML IJ SOLN
INTRAMUSCULAR | Status: AC
  Filled 2013-05-03: qty 1

## 2013-05-03 MED FILL — Heparin Sodium (Porcine) 100 Unt/ML in Sodium Chloride 0.45%: INTRAMUSCULAR | Qty: 250 | Status: AC

## 2013-05-03 NOTE — Progress Notes (Signed)
ANTICOAGULATION CONSULT NOTE - Follow-Up Consult  Pharmacy Consult for heparin Indication: chest pain/ACS  Allergies  Allergen Reactions  . Colcrys [Colchicine]     Skin irritation.   . Contrast Media [Iodinated Diagnostic Agents] Other (See Comments)    Affected kidneys (yellow dye, pigment blue)  . Dabigatran Other (See Comments)    REACTION: Bleeding-low HGB  . Penicillins Swelling    Face swelling    Patient Measurements: Height: 5\' 10"  (177.8 cm) Weight: 205 lb 4 oz (93.1 kg) (scale c) IBW/kg (Calculated) : 73 Heparin Dosing Weight: 91 kg  Vital Signs: Temp: 98 F (36.7 C) (09/19 1355) Temp src: Oral (09/19 1355) BP: 142/78 mmHg (09/19 1355) Pulse Rate: 84 (09/19 1416)  Labs:  Recent Labs  05/02/13 1834 05/02/13 2100 05/02/13 2355 05/03/13 0405 05/03/13 0410 05/03/13 1300  HGB  --   --   --  13.1  --   --   HCT  --   --   --  37.9*  --   --   PLT  --   --   --  130*  --   --   LABPROT  --   --   --  19.5*  --   --   INR  --   --   --  1.70*  --   --   HEPARINUNFRC  --  0.21*  --  0.40  --  0.23*  CREATININE  --   --   --  1.51*  --   --   TROPONINI 1.21*  --  0.90*  --  0.90*  --     Estimated Creatinine Clearance: 44 ml/min (by C-G formula based on Cr of 1.51).  Assessment: 79 year old man transferred from [facility] hospital ED on heparin for evaluation of chest pain.  He was taken to the cath lab today, s/p successful stenting of LM today.  Pharmacy asked to resume Coumadin.  His home dose is 2 mg daily (confirmed with patient).  INR slightly below goal today.  Goal of Therapy:  INR 2-3 Monitor platelets by anticoagulation protocol: Yes   Plan:  1. Coumadin 3 mg po x 1 tonight. 2. Daily PT/INR.  Tad Moore, BCPS  Clinical Pharmacist Pager [phone]  05/03/2013 4:50 PM

## 2013-05-03 NOTE — CV Procedure (Signed)
   Cardiac Catheterization Procedure Note  Name: [name] [name] MRN: [id number] DOB: Jan 11, 1929  Procedure: Left Heart Cath, Selective Coronary Angiography, PTCA and stenting of the left main coronary artery.  Indication: 79 year old white male with known history of coronary disease. 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 He has had prior stenting of the first diagonal. He has a history of chronic atrial fibrillation. He presents with a non-ST elevation myocardial infarction with class IV angina.  Procedural Details:  The right wrist was prepped, draped, and anesthetized with 1% lidocaine. Using the modified Seldinger technique, a 5 French sheath was introduced into the right radial artery. 3 mg of verapamil was administered through the sheath, weight-based unfractionated heparin was administered intravenously. Standard Judkins catheters were used for selective coronary angiography and left ventriculography. Catheter exchanges were performed over an exchange length guidewire.  PROCEDURAL FINDINGS Hemodynamics: AO 132/83 with a mean of 103 mmHg LV 132/17 mmHg   Coronary angiography: Coronary dominance: right  Left mainstem: The left main coronary has a 95% stenosis in the distal vessel. This supplies a moderate to large diagonal branch.  Left anterior descending (LAD): The LAD is occluded after the first diagonal branch. The first diagonal branch is widely patent at the prior stent site.  Left circumflex (LCx): The left circumflex is a very small vessel arising from the right coronary cusp. It is occluded.  Right coronary artery (RCA): The right coronary is a large dominant vessel that is widely patent throughout. It has mild scattered irregularities. There are right-to-left collaterals to the mid and distal LAD.  Left ventriculography: Not performed  PCI Note:  Following the diagnostic  procedure, the decision was made to proceed with PCI. The radial sheath was upsized to a 6 [place]. Weight-based bivalirudin was given for anticoagulation. Plavix 600 mg was given orally. Once a therapeutic ACT was achieved, a 6 [place] XB LAD 3.5 guide catheter was inserted.  A pro-water coronary guidewire was used to cross the lesion.  The lesion was predilated with a 2.5 mm balloon.  The lesion was then stented with a 3.0 x 16 mm Promus premier stent.  The stent was postdilated with a 3.5 mm noncompliant balloon.  Following PCI, there was 0% residual stenosis and TIMI-3 flow. Final angiography confirmed an excellent result. The patient tolerated the procedure well. There were no immediate procedural complications. A TR band was used for radial hemostasis. The patient was transferred to the post catheterization recovery area for further monitoring.  PCI Data: Vessel - left main/Segment - distal Percent Stenosis (pre)  95% TIMI-flow 3 Stent 3.0 x 16 mm Promus premier Percent Stenosis (post) 0% TIMI-flow (post) 3  Final Conclusions:   1. 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 Patient has a new critical stenosis in the distal left main. 2. Successful stenting of the left main coronary with a drug-eluting stent.   Recommendations:  Continue aspirin 81 mg and Plavix. We'll resume Coumadin. I would not resume IV heparin. At one month I would discontinue aspirin and continue Plavix and Coumadin for one year.  Theron Arista [facility] 05/03/2013, 3:13 PM

## 2013-05-03 NOTE — Progress Notes (Signed)
Pt. With critical Troponin value. On call for [facility] notified. No new orders received. RN will continue to monitor pt. For changes in condition. Maryjane Benedict Cherrell  

## 2013-05-03 NOTE — Progress Notes (Signed)
Inpatient Diabetes Program Recommendations  AACE/ADA: New Consensus Statement on Inpatient Glycemic Control (2013)  Target Ranges:  Prepandial:   less than 140 mg/dL      Peak postprandial:   less than 180 mg/dL (1-2 hours)      Critically ill patients:  140 - 180 mg/dL   Reason for Visit: Results for TYQUAN, CARMICKLE (MRN [id number]) as of 05/03/2013 10:37  Ref. Range 05/02/2013 16:49 05/02/2013 18:09 05/02/2013 21:35 05/03/2013 06:38  Glucose-Capillary Latest Range: 70-99 mg/dL 65 (L) 409 (H) 811 (H) 165 (H)   Note patient was on Lantus 25 units daily.  Consider adding Lantus 12 units daily while in the hospital.   Beryl Meager, RN, BC-ADM Inpatient Diabetes Coordinator Pager [phone]

## 2013-05-03 NOTE — Progress Notes (Deleted)
Pt. With critical Troponin value. On call for [facility] notified. No new orders received. RN will continue to monitor pt. For changes in condition. Neeta Storey, Cheryll Dessert

## 2013-05-03 NOTE — Progress Notes (Signed)
ANTICOAGULATION CONSULT NOTE - Follow Up  Pharmacy Consult for heparin Indication: chest pain/ACS  Allergies  Allergen Reactions  . Colcrys [Colchicine]     Skin irritation.   . Contrast Media [Iodinated Diagnostic Agents] Other (See Comments)    Affected kidneys (yellow dye, pigment blue)  . Dabigatran Other (See Comments)    REACTION: Bleeding-low HGB  . Penicillins Swelling    Face swelling    Patient Measurements: Height: 5\' 10"  (177.8 cm) Weight: 205 lb 4 oz (93.1 kg) (scale c) IBW/kg (Calculated) : 73 Heparin Dosing Weight: 91 kg  Vital Signs: Temp: 98.1 F (36.7 C) (09/19 0146) Temp src: Oral (09/19 0146) BP: 104/49 mmHg (09/19 0146) Pulse Rate: 65 (09/19 0146)  Labs:  Recent Labs  05/02/13 1834 05/02/13 2100 05/02/13 2355 05/03/13 0405  HGB  --   --   --  13.1  HCT  --   --   --  37.9*  PLT  --   --   --  130*  LABPROT  --   --   --  19.5*  INR  --   --   --  1.70*  HEPARINUNFRC  --  0.21*  --  0.40  TROPONINI 1.21*  --  0.90*  --     Estimated Creatinine Clearance: 41.7 ml/min (by C-G formula based on Cr of 1.59).  Assessment: 79 year old man transferred from [facility] hospital ED on heparin for evaluation of chest pain.  Heparin level is therapeutic on 1400 units/hr.   Goal of Therapy:  Heparin level 0.3-0.7 units/ml Monitor platelets by anticoagulation protocol: Yes   Plan:  Continue heparin at 1400 units/hr.   Check heparin level in 8 hours to confirm  Talbert Cage Poteet 05/03/2013,4:39 AM

## 2013-05-03 NOTE — Progress Notes (Signed)
Utilization Review Completed.   Kimora Stankovic, RN, BSN Nurse Case Manager  [phone]  

## 2013-05-03 NOTE — Progress Notes (Signed)
TR BAND REMOVAL  LOCATION:    right radial  DEFLATED PER PROTOCOL:    yes  TIME BAND OFF / DRESSING APPLIED:    20:30   SITE UPON ARRIVAL:    Level 0  SITE AFTER BAND REMOVAL:    Level 0  REVERSE ALLEN'S TEST:     positive  CIRCULATION SENSATION AND MOVEMENT:    Within Normal Limits   yes  COMMENTS:    

## 2013-05-03 NOTE — Progress Notes (Signed)
Pt is alert and oriented x4. Pt is on RA. Pt has no complaints of pain. Pt is NPO and is scheduled for a cath today. Consent is signed and in the chart. Will continue to monitor

## 2013-05-03 NOTE — Interval H&P Note (Signed)
History and Physical Interval Note:  05/03/2013 2:15 PM  [name] [name]  has presented today for surgery, with the diagnosis of Chest pain  The various methods of treatment have been discussed with the patient and family. After consideration of risks, benefits and other options for treatment, the patient has consented to  Procedure(s): LEFT HEART CATHETERIZATION WITH CORONARY ANGIOGRAM (N/A) as a surgical intervention .  The patient's history has been reviewed, patient examined, no change in status, stable for surgery.  I have reviewed the patient's chart and labs.  Questions were answered to the patient's satisfaction.   Cath Lab Visit (complete for each Cath Lab visit)  Clinical Evaluation Leading to the Procedure:   ACS: yes  Non-ACS:    Anginal Classification: CCS IV  Anti-ischemic medical therapy: Maximal Therapy (2 or more classes of medications)  Non-Invasive Test Results: No non-invasive testing performed  Prior CABG: No previous CABG        Theron Arista [facility] 05/03/2013 2:15 PM

## 2013-05-04 ENCOUNTER — Encounter (HOSPITAL_COMMUNITY): Payer: Self-pay | Admitting: Nurse Practitioner

## 2013-05-04 LAB — CBC
HCT: 37.4 % — ABNORMAL LOW (ref 39.0–52.0)
Hemoglobin: 12.7 g/dL — ABNORMAL LOW (ref 13.0–17.0)
MCV: 96.6 fL (ref 78.0–100.0)
RBC: 3.87 MIL/uL — ABNORMAL LOW (ref 4.22–5.81)
RDW: 14.5 % (ref 11.5–15.5)
WBC: 12.7 10*3/uL — ABNORMAL HIGH (ref 4.0–10.5)

## 2013-05-04 LAB — BASIC METABOLIC PANEL
BUN: 39 mg/dL — ABNORMAL HIGH (ref 6–23)
CO2: 21 mEq/L (ref 19–32)
Chloride: 99 mEq/L (ref 96–112)
Creatinine, Ser: 1.52 mg/dL — ABNORMAL HIGH (ref 0.50–1.35)
GFR calc Af Amer: 48 mL/min — ABNORMAL LOW (ref >90)
Glucose, Bld: 180 mg/dL — ABNORMAL HIGH (ref 70–99)
Potassium: 4.5 mEq/L (ref 3.5–5.1)

## 2013-05-04 LAB — GLUCOSE, CAPILLARY: Glucose-Capillary: 168 mg/dL — ABNORMAL HIGH (ref 70–99)

## 2013-05-04 LAB — PROTIME-INR: INR: 1.55 — ABNORMAL HIGH (ref 0.00–1.49)

## 2013-05-04 MED ORDER — METOPROLOL TARTRATE 100 MG PO TABS: 100.0000 mg | ORAL_TABLET | Freq: Two times a day (BID) | ORAL | Status: DC

## 2013-05-04 MED ORDER — NITROGLYCERIN 0.4 MG SL SUBL: 0.4000 mg | SUBLINGUAL_TABLET | SUBLINGUAL | Status: DC | PRN

## 2013-05-04 MED ORDER — ASPIRIN 81 MG PO TBEC: 81.0000 mg | DELAYED_RELEASE_TABLET | Freq: Every day | ORAL | Status: DC

## 2013-05-04 MED ORDER — ATORVASTATIN CALCIUM 10 MG PO TABS: 10.0000 mg | ORAL_TABLET | Freq: Every day | ORAL | Status: DC

## 2013-05-04 MED ORDER — METOPROLOL TARTRATE 100 MG PO TABS
100.0000 mg | ORAL_TABLET | Freq: Two times a day (BID) | ORAL | Status: DC
  Administered 2013-05-04: 09:00:00 100 mg via ORAL
  Filled 2013-05-04 (×2): qty 1

## 2013-05-04 MED ORDER — CLOPIDOGREL BISULFATE 75 MG PO TABS: 75.0000 mg | ORAL_TABLET | Freq: Every day | ORAL | Status: DC

## 2013-05-04 NOTE — Progress Notes (Signed)
10CARDIAC REHAB PHASE I   PRE:  Rate/Rhythm: 108 afib  BP:  Supine:   Sitting: 155/114  Standing:    SaO2: 97 RA  MODE:  Ambulation: 300 ft   POST:  Rate/Rhythem: 130 a fib  BP:  Supine:   Sitting: 146/87  Standing:    SaO2: 97    8:42 -922 am  Patient ambulated with assist x 1.  Tolerated well. States he feels like " something has been done" in his chest but denies pain or soreness.  No change with exertion.  BP and Heart Rate elevated, MD and nurse aware, he is taking his routine medications at this time.  Education completed.  Oriented to Cardiac Rehab Phase 1 ans 2.  He is declining.  Home exercise guidelines given , instructions for NTG use and 911. Risk factors, restrictions, and nutrition discussed with patient. Will sign of Cardiac Rehab Phase 1.  Cathie Olden RN   Vinetta Bergamo, Lavon Paganini

## 2013-05-04 NOTE — Progress Notes (Signed)
Patient Name: [name] [name] Date of Encounter: 05/04/2013   Principal Problem:   NSTEMI (non-ST elevated myocardial infarction) Active Problems:   CAD, NATIVE VESSEL   Essential hypertension, benign   Atrial fibrillation   SYSTOLIC HEART FAILURE, CHRONIC   CKD (chronic kidney disease), stage III   HYPERLIPIDEMIA-MIXED   Long term (current) use of anticoagulants    SUBJECTIVE  S/p pci/des to the distal LM yesterday.  No chest pain or sob overnight.  Walked some without limitations.  CURRENT MEDS . aspirin EC  81 mg Oral Daily  . clopidogrel  75 mg Oral Q breakfast  . ferrous sulfate  325 mg Oral Q breakfast  . furosemide  20 mg Oral q morning - 10a  . insulin aspart  0-15 Units Subcutaneous TID WC  . insulin aspart  0-5 Units Subcutaneous QHS  . metoprolol tartrate  75 mg Oral BID  . Warfarin - Pharmacist Dosing Inpatient   Does not apply q1800   OBJECTIVE  Filed Vitals:   05/04/13 0000 05/04/13 0430 05/04/13 0500 05/04/13 0734  BP: 136/80 110/81  141/103  Pulse: 82 111  118  Temp: 97.3 F (36.3 C) 97.8 F (36.6 C)  97.9 F (36.6 C)  TempSrc: Oral Oral  Oral  Resp: 20 18  18   Height:   5\' 10"  (1.778 m)   Weight: 207 lb 10.8 oz (94.2 kg)  207 lb 10.8 oz (94.2 kg)   SpO2: 100% 96%  100%    Intake/Output Summary (Last 24 hours) at 05/04/13 0745 Last data filed at 05/04/13 0500  Gross per 24 hour  Intake 1520.15 ml  Output   1500 ml  Net  20.15 ml   Filed Weights   05/03/13 2100 05/04/13 0000 05/04/13 0500  Weight: 212 lb (96.163 kg) 207 lb 10.8 oz (94.2 kg) 207 lb 10.8 oz (94.2 kg)    PHYSICAL EXAM  General: Pleasant, NAD. Neuro: Alert and oriented X 3. Moves all extremities spontaneously. Psych: Normal affect. HEENT:  Normal except HOH. Neck: Supple without bruits or JVD. Lungs:  Resp regular and unlabored, CTA. Heart: ir, ir, tachy, no s3, s4, or murmurs. Abdomen: Soft, non-tender, non-distended, BS + x 4.  Extremities: No clubbing, cyanosis  or edema. DP/PT/Radials 2+ and equal bilaterally. r wrist w/o bleeding/bruit/hematoma.  Accessory Clinical Findings  CBC  Recent Labs  05/03/13 0405 05/04/13 0620  WBC 8.6 12.7*  HGB 13.1 12.7*  HCT 37.9* 37.4*  MCV 95.2 96.6  PLT 130* 139*   Basic Metabolic Panel  Recent Labs  05/03/13 0405  NA 136  K 5.2*  CL 102  CO2 25  GLUCOSE 156*  BUN 30*  CREATININE 1.51*  CALCIUM 9.3   Cardiac Enzymes  Recent Labs  05/02/13 1834 05/02/13 2355 05/03/13 0410  TROPONINI 1.21* 0.90* 0.90*   TELE  Afib, pod, rates from 90's to 140's.  ECG  Pending.  Radiology/Studies  No results found.  ASSESSMENT AND PLAN  1.  NSTEMI/CAD:  S/p PCI/DES to distal LM yesterday.  No chest pain/sob overnight.  Cardiac rehab to see this AM.  F/U BMET and ecg pending.  Cont asa, plavix, coumadin with plan to d/c asa in 1 month (cont on plavix/coumadin).  Titrate bb to 100 bid with elevated rates.  He says that he tried a statin in the past and had some GI upset.  He is willing to try low dose lipitor.  2.  HTN:  BP trending elevated.  Titrate bb as above.  3.  HL:  Add low-dose statin.  Watch for intolerance as outpt.  4.  Afib:  Rates rise into the 130's and 140's with talking.  Variable overnight.  Titrate bb to 100mg  bid.  Cont coumadin with anticoagulation plan as above.  F/U INR with PCP next week.  5.  CKD III:  F/u bmet pending this AM.  6.  Chronic systolic chf:  EF 40-45% by echo 2012.  Euvolemic.  Not on acei 2/2 CKD.  Cont bb.  7.  Dispo:  Plan d/c today after seen by cardiac rehab.    Signed, Nicolasa Ducking NP  Cardiology Attending  Patient seen and examined, agree with above. He is stable after left main stenting. Ok for discharge home. No change in meds. Note plan for triple therapy and then stopping ASA after one month and continuing plavix and coumadin.   Leonia Reeves.D.

## 2013-05-04 NOTE — Discharge Summary (Signed)
Patient ID: [name] [name],  MRN: [id number], DOB/AGE: [birth year] 79 y.o.  Admit date: 05/02/2013 Discharge date: 05/04/2013  Primary Care Provider: Selinda Flavin Primary Cardiologist: Ival Bible, MD / Shela Commons. Allred, MD (EP)  Discharge Diagnoses Principal Problem:   NSTEMI (non-ST elevated myocardial infarction)  **s/p PCI/DES to the distal Left Main this admission.  Active Problems:   CAD, NATIVE VESSEL   Essential hypertension, benign   Atrial fibrillation   SYSTOLIC HEART FAILURE, CHRONIC   CKD (chronic kidney disease), stage III   HYPERLIPIDEMIA-MIXED   Long term (current) use of anticoagulants  Allergies Allergies  Allergen Reactions  . Colcrys [Colchicine]     Skin irritation.   . Contrast Media [Iodinated Diagnostic Agents] Other (See Comments)    Affected kidneys (yellow dye, pigment blue)  . Dabigatran Other (See Comments)    REACTION: Bleeding-low HGB  . Penicillins Swelling    Face swelling   Procedures  Cardiac Catheterization and Percutaneous Coronary Intervention 9.19.2014  PROCEDURAL FINDINGS Hemodynamics: AO 132/83 with a mean of 103 mmHg LV 132/17 mmHg              Coronary angiography: Coronary dominance: right  Left mainstem: The left main coronary has a 95% stenosis in the distal vessel. This supplies a moderate to large diagonal branch.   ** The distal left main was successfully stented using a 3.0 x 16 mm Promus premier drug eluting stent.**  Left anterior descending (LAD): The LAD is occluded after the first diagonal branch. The first diagonal branch is widely patent at the prior stent site. Left circumflex (LCx): The left circumflex is a very small vessel arising from the right coronary cusp. It is occluded. Right coronary artery (RCA): The right coronary is a large dominant vessel that is widely patent throughout. It has mild scattered irregularities. There are right-to-left collaterals to the mid and distal LAD. Left ventriculography: Not  performed  Final Conclusions:   1. 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 Patient has a new critical stenosis in the distal left main. 2. Successful stenting of the left main coronary with a drug-eluting stent.   Recommendations:  Continue aspirin 81 mg and Plavix. We'll resume Coumadin. I would not resume IV heparin. At one month I would discontinue aspirin and continue Plavix and Coumadin for one year. _____________   History of Present Illness  79 year old male with prior history of coronary artery disease and known occlusions of the LAD and circumflex. He also has a history of atrial fibrillation and is on chronic Coumadin therapy. He was in his usual state of health until approximately 2 days prior to admission when he began to experience intermittent chest discomfort radiating to the right shoulder. He presented to the [facility] emergency department where his ECG was nonacute however his troponin was raised at 3.5. He was transferred to [facility] cone for further evaluation.  Hospital Course  Here, patient's troponin peaked at 1.21. He had no further chest pain. His Coumadin was held and he was placed on heparin as well as aspirin and beta blocker therapy. He underwent diagnostic catheterization on the morning of September 19 revealing severe stenosis within the distal left main, which was new. This was felt to be the culprit lesion and was successfully stented using a 3.0 by 16mm Promus Premier drug-eluting stent. Patient tolerated procedure well and post procedure was maintained on aspirin and Plavix while his home dose of Coumadin  was resumed. He has ambulated without difficulty and had no recurrent chest discomfort. We plan to discharge him home today in good condition on triple antiplatelet therapy for one month. After one month, patient is to discontinue aspirin and remain on Plavix and Coumadin for the  remainder of one year.  Discharge Vitals Blood pressure 141/103, pulse 118, temperature 97.9 F (36.6 C), temperature source Oral, resp. rate 18, height 5\' 10"  (1.778 m), weight 207 lb 10.8 oz (94.2 kg), SpO2 100.00%.  Filed Weights   05/03/13 2100 05/04/13 0000 05/04/13 0500  Weight: 212 lb (96.163 kg) 207 lb 10.8 oz (94.2 kg) 207 lb 10.8 oz (94.2 kg)   Labs  CBC  Recent Labs  05/03/13 0405 05/04/13 0620  WBC 8.6 12.7*  HGB 13.1 12.7*  HCT 37.9* 37.4*  MCV 95.2 96.6  PLT 130* 139*   Basic Metabolic Panel  Recent Labs  05/03/13 0405  NA 136  K 5.2*  CL 102  CO2 25  GLUCOSE 156*  BUN 30*  CREATININE 1.51*  CALCIUM 9.3   Cardiac Enzymes  Recent Labs  05/02/13 1834 05/02/13 2355 05/03/13 0410  TROPONINI 1.21* 0.90* 0.90*   Disposition  Pt is being discharged home today in good condition.  Follow-up Plans & Appointments  Follow-up Information   Follow up with Nona Dell, MD. (1-2 wks;  we will arrange)    Specialty:  Cardiology   Contact information:   [address] [place] [address] [phone]       Follow up with Selinda Flavin, MD In 5 days. (follow-up INR (coumadin check).)    Specialty:  Family Medicine   Contact information:   [address] [place] [address] [phone]     Discharge Medications    Medication List         aspirin 81 MG EC tablet  Take 1 tablet (81 mg total) by mouth daily.     atorvastatin 10 MG tablet  Commonly known as:  LIPITOR  Take 1 tablet (10 mg total) by mouth daily.     clopidogrel 75 MG tablet  Commonly known as:  PLAVIX  Take 1 tablet (75 mg total) by mouth daily with breakfast.     FEOSOL 325 (65 FE) MG tablet  Generic drug:  ferrous sulfate  Take 325 mg by mouth daily.     furosemide 40 MG tablet  Commonly known as:  LASIX  Take 20 mg by mouth every morning.     guaiFENesin 600 MG 12 hr tablet  Commonly known as:  MUCINEX  Take 600 mg by mouth 2 (two) times daily as needed  for congestion.     LANTUS 100 UNIT/ML injection  Generic drug:  insulin glargine  Inject 25 Units into the skin at bedtime.     metoprolol 100 MG tablet  Commonly known as:  LOPRESSOR  Take 1 tablet (100 mg total) by mouth 2 (two) times daily.     nitroGLYCERIN 0.4 MG SL tablet  Commonly known as:  NITROSTAT  Place 1 tablet (0.4 mg total) under the tongue every 5 (five) minutes as needed for chest pain.     vitamin C 1000 MG tablet  Take 1,000 mg by mouth daily.     warfarin 2 MG tablet  Commonly known as:  COUMADIN  Take 2 mg by mouth every evening. Managed by Dr. [name] (INR - 2.4 last 4/17)       Outstanding Labs/Studies  F/u INR next  week @ PCP's office. F/u lipids/lft's in 6 wks.  Duration of Discharge Encounter   Greater than 30 minutes including physician time.  Signed, Nicolasa Ducking NP 05/04/2013, 8:23 AM

## 2013-05-04 NOTE — Progress Notes (Signed)
ANTICOAGULATION CONSULT NOTE - Follow-Up Consult  Pharmacy Consult for heparin Indication: chest pain/ACS  Allergies  Allergen Reactions  . Colcrys [Colchicine]     Skin irritation.   . Contrast Media [Iodinated Diagnostic Agents] Other (See Comments)    Affected kidneys (yellow dye, pigment blue)  . Dabigatran Other (See Comments)    REACTION: Bleeding-low HGB  . Penicillins Swelling    Face swelling   Patient Measurements: Height: 5\' 10"  (177.8 cm) Weight: 207 lb 10.8 oz (94.2 kg) IBW/kg (Calculated) : 73 Heparin Dosing Weight: 91 kg  Vital Signs: Temp: 97.9 F (36.6 C) (09/20 0734) Temp src: Oral (09/20 0734) BP: 150/72 mmHg (09/20 0936) Pulse Rate: 118 (09/20 0734)  Labs:  Recent Labs  05/02/13 1834 05/02/13 2100 05/02/13 2355 05/03/13 0405 05/03/13 0410 05/03/13 1300 05/04/13 0620  HGB  --   --   --  13.1  --   --  12.7*  HCT  --   --   --  37.9*  --   --  37.4*  PLT  --   --   --  130*  --   --  139*  LABPROT  --   --   --  19.5*  --   --  18.2*  INR  --   --   --  1.70*  --   --  1.55*  HEPARINUNFRC  --  0.21*  --  0.40  --  0.23*  --   CREATININE  --   --   --  1.51*  --   --  1.52*  TROPONINI 1.21*  --  0.90*  --  0.90*  --   --    Estimated Creatinine Clearance: 43.9 ml/min (by C-G formula based on Cr of 1.52).  Assessment: 79 year old man transferred from [facility] hospital ED on heparin for evaluation of chest pain.  He was taken to the cath lab today, s/p successful stenting of LM today.  He has been resumed on his Warfarin therapy for history of Afib and CVA.  His INR has dropped this morning to 1.55.  His CBC is stable and he has some mild thrombocytopenia with platelets of 139K.  No noted bleeding complications.  Plans for discharge today as indicated.  HOME dose:  Warfarin 2 mg daily.  Goal of Therapy:  INR 2-3 Monitor platelets by anticoagulation protocol: Yes   Plan:  1. Coumadin 5 mg po x 1 tonight if he doesn't go home. 2. Daily  PT/INR.  Nadara Mustard, PharmD., MS Clinical Pharmacist Pager:  [phone] Thank you for allowing pharmacy to be part of this patients care team.  05/04/2013 11:11 AM

## 2013-05-06 ENCOUNTER — Ambulatory Visit (INDEPENDENT_AMBULATORY_CARE_PROVIDER_SITE_OTHER): Payer: Medicare Other | Admitting: Cardiology

## 2013-05-06 ENCOUNTER — Encounter: Payer: Self-pay | Admitting: Cardiology

## 2013-05-06 VITALS — BP 119/87 | HR 142 | Ht 68.0 in | Wt 208.8 lb

## 2013-05-06 DIAGNOSIS — R001 Bradycardia, unspecified: Secondary | ICD-10-CM

## 2013-05-06 DIAGNOSIS — I10 Essential (primary) hypertension: Secondary | ICD-10-CM

## 2013-05-06 DIAGNOSIS — I2510 Atherosclerotic heart disease of native coronary artery without angina pectoris: Secondary | ICD-10-CM

## 2013-05-06 DIAGNOSIS — N189 Chronic kidney disease, unspecified: Secondary | ICD-10-CM

## 2013-05-06 DIAGNOSIS — I4891 Unspecified atrial fibrillation: Secondary | ICD-10-CM

## 2013-05-06 DIAGNOSIS — I498 Other specified cardiac arrhythmias: Secondary | ICD-10-CM

## 2013-05-06 LAB — POCT ACTIVATED CLOTTING TIME: Activated Clotting Time: 698 seconds

## 2013-05-06 MED ORDER — DILTIAZEM HCL ER COATED BEADS 120 MG PO CP24: 120.0000 mg | ORAL_CAPSULE | Freq: Every day | ORAL | Status: DC

## 2013-05-06 MED FILL — Sodium Chloride IV Soln 0.9%: INTRAVENOUS | Qty: 1000 | Status: AC

## 2013-05-06 MED FILL — Sodium Chloride IV Soln 0.9%: INTRAVENOUS | Qty: 50 | Status: AC

## 2013-05-06 NOTE — Assessment & Plan Note (Signed)
Blood pressure control is good. 

## 2013-05-06 NOTE — Patient Instructions (Addendum)
Your physician recommends that you schedule a follow-up appointment in: 4 weeks with Dr. Diona Browner.  Your physician has recommended you make the following change in your medication:  Start: Cardizem CD 120 MG once daily. I have called this prescription in for you at [facility] in [place].  Continue all other medications the same.   Later this week (Thursday or Friday) call our office at [phone] to report your heart rates. Ask of Isabelle Course or [place].

## 2013-05-06 NOTE — Progress Notes (Signed)
Clinical Summary [name] [name] is a medically complex 79 y.o.male recently discharged from [facility] on 9/20. Records reviewed. He presented with recent onset anginal chest pain, was diagnosed with an NSTEMI, underwent DES to the distal left main by Dr. Swaziland. Plan was to continue aspirin, Plavix, and resumption of Coumadin.  Lab work at discharge included potassium 4.5, BUN 39, creatinine 1.5, hemoglobin 12.7, platelets 139. Heart rate at discharge was listed at 118. Not certain about the details. He has a known history of atrial fibrillation as well as Mobitz 2 second-degree heart block status post St. Jude leadless pacemaker by Dr. Johney Frame.  He presents with his wife and daughter today. Reports no further chest pain, however his heart rate remains significantly elevated. They have been getting rates of 120-140 at home, confirmed by ECG today showing rapid atrial fibrillation. They were aware that his heart rate was elevated when he went home from the hospital.  In reviewing medications, I see that he is no longer on Cardizem CD 120 mg daily which we had been using in combination with Lopressor for heart rate control. At the last visit, his heart rate was very well controlled.  He reports no bleeding problems.   Allergies  Allergen Reactions  . Colcrys [Colchicine]     Skin irritation.   . Contrast Media [Iodinated Diagnostic Agents] Other (See Comments)    Affected kidneys (yellow dye, pigment blue)  . Dabigatran Other (See Comments)    REACTION: Bleeding-low HGB  . Penicillins Swelling    Face swelling    Current Outpatient Prescriptions  Medication Sig Dispense Refill  . Ascorbic Acid (VITAMIN C) 1000 MG tablet Take 1,000 mg by mouth daily.      Marland Kitchen aspirin EC 81 MG EC tablet Take 1 tablet (81 mg total) by mouth daily.  30 tablet  0  . atorvastatin (LIPITOR) 10 MG tablet Take 1 tablet (10 mg total) by mouth daily.  30 tablet  6  . clopidogrel (PLAVIX) 75 MG tablet Take 1 tablet (75  mg total) by mouth daily with breakfast.  30 tablet  6  . ferrous sulfate (FEOSOL) 325 (65 FE) MG tablet Take 325 mg by mouth daily.      . furosemide (LASIX) 40 MG tablet Take 20 mg by mouth every morning.       Marland Kitchen guaiFENesin (MUCINEX) 600 MG 12 hr tablet Take 600 mg by mouth 2 (two) times daily as needed for congestion.      Marland Kitchen LANTUS 100 UNIT/ML injection Inject 25 Units into the skin at bedtime.      . metoprolol tartrate (LOPRESSOR) 100 MG tablet Take 1 tablet (100 mg total) by mouth 2 (two) times daily.  60 tablet  6  . nitroGLYCERIN (NITROSTAT) 0.4 MG SL tablet Place 1 tablet (0.4 mg total) under the tongue every 5 (five) minutes as needed for chest pain.  25 tablet  3  . warfarin (COUMADIN) 2 MG tablet Take 2 mg by mouth every evening. Managed by Dr. [name] (INR - 2.4 last 4/17)      . diltiazem (CARDIZEM CD) 120 MG 24 hr capsule Take 1 capsule (120 mg total) by mouth daily.  30 capsule  6   No current facility-administered medications for this visit.    Past Medical History  Diagnosis Date  . Persistent atrial fibrillation     a. Failed prior DCCV x 2; b. chronic coumadin.  . Essential hypertension, benign   . Mixed hyperlipidemia   .  Chronic systolic heart failure     a. EF 40-45% by echo 2012.  . GI bleed     December 2011 - subsequent discontinuation of Pradaxa  . Right bundle branch block   . Coronary atherosclerosis of native coronary artery     a. s/p MI 1998;  b. Chronically occluded LAD, patent DES diagonal, subtotally occluded anomalous circumflex - failed prior PCI;  b. 04/2013 Cath/PCI: LM 95d (3.0x16 Promus Premier DES), LAD 00, D1 patent stent, LCX small 100, RCA min irregs, R->L Collats.  . Bradycardia     a. s/p SJM nanostim leadless pacemaker implanted by Dr Johney Frame  . Ischemic cardiomyopathy     LVEF 45%  . Pacemaker 01/18/2013    STJ Nanostim Leadless pacemaker implanted by Dr Johney Frame  . History of pneumonia 1970's  . Type II diabetes mellitus   . Anemia   .  History of blood transfusion 09/2010; 12/2012  . CVA (cerebral vascular accident) 09/2010    Suspected embolic while off anticoagulation.  . Arthritis   . CKD (chronic kidney disease) stage 3, GFR 30-59 ml/min     Past Surgical History  Procedure Laterality Date  . Pacemaker insertion  01/18/2013    STJ Nanostim Leadless pacemaker implanted by Dr Johney Frame  . Lumbar disc surgery  2000's  . Coronary angioplasty  1998  . Coronary angioplasty with stent placement  2007    "1" (01/18/2013)  . Cystectomy  1951    "off my neck" (01/18/2013)    Social History Mr. Edelson reports that he quit smoking about 43 years ago. His smoking use included Cigarettes. He has a 12 pack-year smoking history. He has never used smokeless tobacco. Mr. Cordner reports that he does not drink alcohol.  Review of Systems Negative except as outlined.  Physical Examination Filed Vitals:   05/06/13 1404  BP: 119/87  Pulse: 142   Filed Weights   05/06/13 1404  Weight: 208 lb 12.8 oz (94.711 kg)   No acute distress. Appears comfortable. HEENT: Conjunctiva and lids normal, oropharynx clear.  Neck: Supple, no elevated JVP or carotid bruits, no thyromegaly.  Lungs: Clear to auscultation, diminished, nonlabored breathing at rest.  Cardiac: Irregularly irregular and rapid, no S3, indistinct PMI. Abdomen: Soft, nontender, protuberant, bowel sounds present, no guarding or rebound.  Extremities: Trace edema, distal pulses 2+.  Skin: Warm and dry.  Musculoskeletal: No kyphosis.  Neuropsychiatric: Alert and oriented x3.   Problem List and Plan   Atrial fibrillation Currently with rapid ventricular response, not in any distress at this time. I reviewed the recent hospital records, heart rate was elevated at discharge. He required treatment with both calcium channel blocker and beta blocker for adequate heart rate control previously. I would recommend reinstituting Cardizem CD at 120 mg daily, continue current dose of  Lopressor. He will let us know heart rate response. Otherwise continues on Coumadin.  CAD, NATIVE VESSEL Recent NSTEMI, significant left main disease documented at catheterization that was treated with DES by Dr. Swaziland. He will need to continue on DAPT.  Essential hypertension, benign Blood pressure control is good.  RENAL INSUFFICIENCY, CHRONIC Creatinine 1.5 at hospital discharge.  Symptomatic bradycardia Status post St. Jude leadless pacemaker with history of heart block, followed by Dr. Johney Frame.    Jonelle Sidle, M.D., F.A.C.C.

## 2013-05-06 NOTE — Assessment & Plan Note (Signed)
Creatinine 1.5 at hospital discharge.

## 2013-05-06 NOTE — Assessment & Plan Note (Signed)
Status post St. Jude leadless pacemaker with history of heart block, followed by Dr. Johney Frame.

## 2013-05-06 NOTE — Assessment & Plan Note (Signed)
Currently with rapid ventricular response, not in any distress at this time. I reviewed the recent hospital records, heart rate was elevated at discharge. He required treatment with both calcium channel blocker and beta blocker for adequate heart rate control previously. I would recommend reinstituting Cardizem CD at 120 mg daily, continue current dose of Lopressor. He will let us know heart rate response. Otherwise continues on Coumadin.

## 2013-05-06 NOTE — Assessment & Plan Note (Signed)
Recent NSTEMI, significant left main disease documented at catheterization that was treated with DES by Dr. Swaziland. He will need to continue on DAPT.

## 2013-05-10 ENCOUNTER — Telehealth: Payer: Self-pay | Admitting: Cardiology

## 2013-05-10 MED ORDER — METOPROLOL TARTRATE 25 MG PO TABS: 75.0000 mg | ORAL_TABLET | Freq: Three times a day (TID) | ORAL | Status: DC

## 2013-05-10 NOTE — Telephone Encounter (Signed)
Mrs. [name] [name] called the office today to report Mr. [name] [name] heart rate. States that @ 9:50am the HR was 131. Mr. [name] [name] has No complaints this morning. Frederic Jericho and she suggested phone note route to Dr. Diona Browner.

## 2013-05-10 NOTE — Telephone Encounter (Signed)
HR 91 at 1:00pm

## 2013-05-10 NOTE — Telephone Encounter (Signed)
Please see my recent office note from Monday. I think that we are going to have to put him back on the specific medical regimen that he was on when heart rate was best controlled prior to his most recent hospitalization at [facility]. Suggest Lopressor 75 mg 3 times a day as he was taking previously, continue on Cardizem CD 120 mg daily. Keep an eye on heart rate and let us know if it does not come down further.

## 2013-05-10 NOTE — Telephone Encounter (Signed)
Wife ([name]) notified.  She will call back on Monday with an update.

## 2013-05-13 ENCOUNTER — Telehealth: Payer: Self-pay | Admitting: Cardiology

## 2013-05-13 NOTE — Telephone Encounter (Signed)
Please see note below. 

## 2013-05-13 NOTE — Telephone Encounter (Signed)
[name] [name] called today to report his HR readings. States that on Sunday 05-12-13 it was running In the 80 today 05-13-13 it is 96. States that he is feeling much better. He has a question About his [employer]

## 2013-05-14 NOTE — Telephone Encounter (Signed)
Called patient to answer questions about metoprolol. Patient wanted to know if he could take 1/2 of his 100 mg tablet of metoprolol with 25 mg tablets to equal the 75 mg dose. Nurse advised patient that he could do this. Patient verbalized understanding of plan.

## 2013-05-22 ENCOUNTER — Encounter: Payer: Self-pay | Admitting: Internal Medicine

## 2013-05-24 ENCOUNTER — Encounter: Payer: Self-pay | Admitting: Cardiology

## 2013-05-24 ENCOUNTER — Ambulatory Visit (INDEPENDENT_AMBULATORY_CARE_PROVIDER_SITE_OTHER): Payer: Medicare Other | Admitting: Cardiology

## 2013-05-24 VITALS — BP 135/88 | HR 70 | Ht 70.0 in | Wt 211.4 lb

## 2013-05-24 DIAGNOSIS — I4891 Unspecified atrial fibrillation: Secondary | ICD-10-CM

## 2013-05-24 DIAGNOSIS — I498 Other specified cardiac arrhythmias: Secondary | ICD-10-CM

## 2013-05-24 DIAGNOSIS — I2510 Atherosclerotic heart disease of native coronary artery without angina pectoris: Secondary | ICD-10-CM

## 2013-05-24 DIAGNOSIS — R001 Bradycardia, unspecified: Secondary | ICD-10-CM

## 2013-05-24 MED ORDER — METOPROLOL TARTRATE 50 MG PO TABS: 75.0000 mg | ORAL_TABLET | Freq: Three times a day (TID) | ORAL | Status: DC

## 2013-05-24 NOTE — Progress Notes (Signed)
Clinical Summary Mr. Depaulo is a medically complex 79 y.o.male seen recently in late September. Medications were adjusted at that time. Telephone notes reviewed documenting significant improvement in measured heart rates at home. He is here with his wife and daughter today. Confirms feeling better, heart rate looks well controlled today. He reports compliance with his medications. Chronic shortness of breath, although better than it was prior to recent coronary intervention. No angina. Discussed continuing medical therapy and observation.   Allergies  Allergen Reactions  . Colcrys [Colchicine]     Skin irritation.   . Contrast Media [Iodinated Diagnostic Agents] Other (See Comments)    Affected kidneys (yellow dye, pigment blue)  . Dabigatran Other (See Comments)    REACTION: Bleeding-low HGB  . Penicillins Swelling    Face swelling    Current Outpatient Prescriptions  Medication Sig Dispense Refill  . Ascorbic Acid (VITAMIN C) 1000 MG tablet Take 1,000 mg by mouth daily.      Marland Kitchen aspirin EC 81 MG EC tablet Take 1 tablet (81 mg total) by mouth daily.  30 tablet  0  . atorvastatin (LIPITOR) 10 MG tablet Take 1 tablet (10 mg total) by mouth daily.  30 tablet  6  . clopidogrel (PLAVIX) 75 MG tablet Take 1 tablet (75 mg total) by mouth daily with breakfast.  30 tablet  6  . diltiazem (CARDIZEM CD) 120 MG 24 hr capsule Take 1 capsule (120 mg total) by mouth daily.  30 capsule  6  . ferrous sulfate (FEOSOL) 325 (65 FE) MG tablet Take 325 mg by mouth daily.      . furosemide (LASIX) 40 MG tablet Take 20 mg by mouth every morning.       Marland Kitchen guaiFENesin (MUCINEX) 600 MG 12 hr tablet Take 600 mg by mouth 2 (two) times daily as needed for congestion.      Marland Kitchen LANTUS 100 UNIT/ML injection Inject 25 Units into the skin at bedtime.      . metoprolol tartrate (LOPRESSOR) 50 MG tablet Take 1.5 tablets (75 mg total) by mouth 3 (three) times daily.  135 tablet  6  . nitroGLYCERIN (NITROSTAT) 0.4 MG SL  tablet Place 1 tablet (0.4 mg total) under the tongue every 5 (five) minutes as needed for chest pain.  25 tablet  3  . warfarin (COUMADIN) 2 MG tablet Take 2 mg by mouth every evening. Managed by Dr. [name] (INR - 2.4 last 4/17)       No current facility-administered medications for this visit.    Past Medical History  Diagnosis Date  . Persistent atrial fibrillation     a. Failed prior DCCV x 2; b. chronic coumadin.  . Essential hypertension, benign   . Mixed hyperlipidemia   . Chronic systolic heart failure     a. EF 40-45% by echo 2012.  . GI bleed     December 2011 - subsequent discontinuation of Pradaxa  . Right bundle branch block   . Coronary atherosclerosis of native coronary artery     a. s/p MI 1998;  b. Chronically occluded LAD, patent DES diagonal, subtotally occluded anomalous circumflex - failed prior PCI;  b. 04/2013 Cath/PCI: LM 95d (3.0x16 Promus Premier DES), LAD 00, D1 patent stent, LCX small 100, RCA min irregs, R->L Collats.  . Bradycardia     a. s/p SJM nanostim leadless pacemaker implanted by Dr Johney Frame  . Ischemic cardiomyopathy     LVEF 45%  . Pacemaker 01/18/2013    STJ  Nanostim Leadless pacemaker implanted by Dr Johney Frame  . History of pneumonia 1970's  . Type II diabetes mellitus   . Anemia   . History of blood transfusion 09/2010; 12/2012  . CVA (cerebral vascular accident) 09/2010    Suspected embolic while off anticoagulation.  . Arthritis   . CKD (chronic kidney disease) stage 3, GFR 30-59 ml/min     Social History Mr. Cothern reports that he quit smoking about 43 years ago. His smoking use included Cigarettes. He has a 12 pack-year smoking history. He has never used smokeless tobacco. Mr. Poffenberger reports that he does not drink alcohol.  Review of Systems No bleeding problems. Chronic back pain. Otherwise negative.  Physical Examination Filed Vitals:   05/24/13 1148  BP: 135/88  Pulse: 70   Filed Weights   05/24/13 1148  Weight: 211 lb 6.4 oz  (95.89 kg)    No acute distress. Appears comfortable.  HEENT: Conjunctiva and lids normal, oropharynx clear.  Neck: Supple, no elevated JVP or carotid bruits, no thyromegaly.  Lungs: Clear to auscultation, diminished, nonlabored breathing at rest.  Cardiac: Irregularly irregular and rapid, no S3, indistinct PMI.  Abdomen: Soft, nontender, protuberant, bowel sounds present, no guarding or rebound.  Extremities: Trace edema, distal pulses 2+.    Problem List and Plan   Atrial fibrillation Continue current medical regimen which includes Lopressor 75 mg 3 times a day, Cardizem CD 120 mg daily, and Coumadin.  CAD, NATIVE VESSEL Recent diagnosis of significant left main disease treated with DES by Dr. Swaziland. He will need to continue on DAPT.  Symptomatic bradycardia Status post leadless St. Jude pacemaker, followed by Dr. Johney Frame.    Jonelle Sidle, M.D., F.A.C.C.

## 2013-05-24 NOTE — Assessment & Plan Note (Signed)
Status post leadless St. Jude pacemaker, followed by Dr. Johney Frame.

## 2013-05-24 NOTE — Patient Instructions (Signed)
   Lopressor 50mg  tablet sent to pharm - will take 1 1/2 three times per day  Continue all current medications. Follow up in  3 months

## 2013-05-24 NOTE — Assessment & Plan Note (Addendum)
Recent diagnosis of significant left main disease treated with DES by Dr. Swaziland. He will need to continue on DAPT.

## 2013-05-24 NOTE — Assessment & Plan Note (Signed)
Continue current medical regimen which includes Lopressor 75 mg 3 times a day, Cardizem CD 120 mg daily, and Coumadin.

## 2013-07-08 ENCOUNTER — Encounter: Payer: Self-pay | Admitting: Cardiology

## 2013-07-15 ENCOUNTER — Encounter: Payer: Self-pay | Admitting: Internal Medicine

## 2013-07-15 ENCOUNTER — Ambulatory Visit (INDEPENDENT_AMBULATORY_CARE_PROVIDER_SITE_OTHER): Payer: Medicare Other | Admitting: Internal Medicine

## 2013-07-15 VITALS — BP 136/78 | HR 89 | Ht 70.0 in | Wt 210.4 lb

## 2013-07-15 DIAGNOSIS — I441 Atrioventricular block, second degree: Secondary | ICD-10-CM | POA: Insufficient documentation

## 2013-07-15 DIAGNOSIS — Z7901 Long term (current) use of anticoagulants: Secondary | ICD-10-CM

## 2013-07-15 DIAGNOSIS — I2510 Atherosclerotic heart disease of native coronary artery without angina pectoris: Secondary | ICD-10-CM

## 2013-07-15 DIAGNOSIS — E785 Hyperlipidemia, unspecified: Secondary | ICD-10-CM

## 2013-07-15 DIAGNOSIS — I4891 Unspecified atrial fibrillation: Secondary | ICD-10-CM

## 2013-07-15 DIAGNOSIS — I5022 Chronic systolic (congestive) heart failure: Secondary | ICD-10-CM

## 2013-07-15 MED ORDER — DILTIAZEM HCL ER COATED BEADS 240 MG PO CP24: 240.0000 mg | ORAL_CAPSULE | Freq: Every day | ORAL | Status: DC

## 2013-07-15 MED ORDER — FUROSEMIDE 40 MG PO TABS: 40.0000 mg | ORAL_TABLET | Freq: Every morning | ORAL | Status: DC

## 2013-07-15 NOTE — Patient Instructions (Signed)
Your physician wants you to follow-up in: 6 months with Dr Jacquiline Doe will receive a reminder letter in the mail two months in advance. If you don't receive a letter, please call our office to schedule the follow-up appointment.  Your physician has recommended you make the following change in your medication:  1) Increase Furosemide to 40mg  daily 2) Increase Cardizem to 240mg  daily

## 2013-07-15 NOTE — Progress Notes (Signed)
PCP:  Selinda Flavin, MD Primary Cardiologist:  Dr Diona Browner  The patient presents today for routine electrophysiology followup.  Since last being seen in our clinic, the patient reports doing reasonably well.   He has improved greatly with pacing.  He continues to have RVR intermittently with his afib.  He also reports worsening BLE edema.  Today, he denies symptoms of palpitations, chest pain, shortness of breath, orthopnea, PND, dizziness, presyncope, syncope, or neurologic sequela.  The patient feels that he is tolerating medications without difficulties and is otherwise without complaint today.   Past Medical History  Diagnosis Date  . Persistent atrial fibrillation     a. Failed prior DCCV x 2; b. chronic coumadin.  . Essential hypertension, benign   . Mixed hyperlipidemia   . Chronic systolic heart failure     a. EF 40-45% by echo 2012.  . GI bleed     December 2011 - subsequent discontinuation of Pradaxa  . Right bundle branch block   . Coronary atherosclerosis of native coronary artery     a. s/p MI 1998;  b. Chronically occluded LAD, patent DES diagonal, subtotally occluded anomalous circumflex - failed prior PCI;  b. 04/2013 Cath/PCI: LM 95d (3.0x16 Promus Premier DES), LAD 00, D1 patent stent, LCX small 100, RCA min irregs, R->L Collats.  . Bradycardia     a. s/p SJM nanostim leadless pacemaker implanted by Dr Johney Frame  . Ischemic cardiomyopathy     LVEF 45%  . Pacemaker 01/18/2013    STJ Nanostim Leadless pacemaker implanted by Dr Johney Frame  . History of pneumonia 1970's  . Type II diabetes mellitus   . Anemia   . History of blood transfusion 09/2010; 12/2012  . CVA (cerebral vascular accident) 09/2010    Suspected embolic while off anticoagulation.  . Arthritis   . CKD (chronic kidney disease) stage 3, GFR 30-59 ml/min    Past Surgical History  Procedure Laterality Date  . Pacemaker insertion  01/18/2013    STJ Nanostim Leadless pacemaker implanted by Dr Johney Frame  . Lumbar disc  surgery  2000's  . Coronary angioplasty  1998  . Coronary angioplasty with stent placement  2007    "1" (01/18/2013)  . Cystectomy  1951    "off my neck" (01/18/2013)    Current Outpatient Prescriptions  Medication Sig Dispense Refill  . Ascorbic Acid (VITAMIN C) 1000 MG tablet Take 1,000 mg by mouth daily.      Marland Kitchen aspirin EC 81 MG EC tablet Take 1 tablet (81 mg total) by mouth daily.  30 tablet  0  . atorvastatin (LIPITOR) 10 MG tablet Take 1 tablet (10 mg total) by mouth daily.  30 tablet  6  . clopidogrel (PLAVIX) 75 MG tablet Take 1 tablet (75 mg total) by mouth daily with breakfast.  30 tablet  6  . diltiazem (CARDIZEM CD) 240 MG 24 hr capsule Take 1 capsule (240 mg total) by mouth daily.  90 capsule  3  . ferrous sulfate (FEOSOL) 325 (65 FE) MG tablet Take 325 mg by mouth daily.      . furosemide (LASIX) 40 MG tablet Take 1 tablet (40 mg total) by mouth every morning.  30 tablet    . guaiFENesin (MUCINEX) 600 MG 12 hr tablet Take 600 mg by mouth 2 (two) times daily as needed for congestion.      Marland Kitchen LANTUS 100 UNIT/ML injection Inject 25 Units into the skin at bedtime.      . metoprolol tartrate (  LOPRESSOR) 50 MG tablet Take 1.5 tablets (75 mg total) by mouth 3 (three) times daily.  135 tablet  6  . nitroGLYCERIN (NITROSTAT) 0.4 MG SL tablet Place 1 tablet (0.4 mg total) under the tongue every 5 (five) minutes as needed for chest pain.  25 tablet  3  . warfarin (COUMADIN) 2 MG tablet Managed by Dr. [name].  Take as directed by the coumadin clinic       No current facility-administered medications for this visit.    Allergies  Allergen Reactions  . Colcrys [Colchicine]     Skin irritation.   . Contrast Media [Iodinated Diagnostic Agents] Other (See Comments)    Affected kidneys (yellow dye, pigment blue)  . Dabigatran Other (See Comments)    REACTION: Bleeding-low HGB  . Penicillins Swelling    Face swelling    History   Social History  . Marital Status: Married    Spouse  Name: [name]    Number of Children: N/A  . Years of Education: N/A   Occupational History  . RETIRED   .     Social History Main Topics  . Smoking status: Former Smoker -- 0.30 packs/day for 40 years    Types: Cigarettes    Quit date: 08/15/1969  . Smokeless tobacco: Never Used  . Alcohol Use: No  . Drug Use: No  . Sexual Activity: No   Other Topics Concern  . Not on file   Social History Narrative  . No narrative on file    Family History  Problem Relation Age of Onset  . Diabetes Other   . Hypertension Other     ROS-  All systems are reviewed and are negative except as outlined in the HPI above  Physical Exam: Filed Vitals:   07/15/13 1507  BP: 136/78  Pulse: 89  Height: 5\' 10"  (1.778 m)  Weight: 210 lb 6.4 oz (95.437 kg)    GEN- The patient is well appearing, alert and oriented x 3 today.   Head- normocephalic, atraumatic Eyes-  Sclera clear, conjunctiva pink Ears- hearing intact Oropharynx- clear Neck- supple, no JVP Lymph- no cervical lymphadenopathy Lungs- Clear to ausculation bilaterally, normal work of breathing Heart- tachycardic irregular rhythm, 2/6 SEM LUSB, 2/6 SEM at the apex GI- soft, NT, ND, + BS Extremities- no clubbing, cyanosis, +2 edema Neuro- strength and sensation are intact  ekg today reveals afib, V rate 101 bpm, RBBB, LAHB Labs reviewed in epic Dr Orson Gear note are also reviewed  Assessment and Plan:  1. Permanent afib V rates are frequently elevated Increase diltiazem to 240mg  daily Continue long term anticoagulation  2. Mobitz II second degree AV block Normal leadless pacemaker function See paceart  3. Acute on chronic diastolic dysfunction Likely worsened by RVR with Afib Rate control as above Increase lasix to 40mg  daily 2 gram sodium restriction  4. CAD Consider stopping ASA and contiuing plavix plus oral anticoagulation with coumadin I will defer this decision to Dr Diona Browner  followup with Dr Diona Browner in 6  weeks for bmet and further management of afib with RVR and chf  Return to the device clinic to see me in 6 months

## 2013-07-16 LAB — MDC_IDC_ENUM_SESS_TYPE_INCLINIC
Battery Remaining Longevity: 120 mo
Battery Voltage: 3.3 V
Brady Statistic RV Percent Paced: 20 %
Implantable Pulse Generator Serial Number: 4248
Lead Channel Impedance Value: 570 Ohm
Lead Channel Pacing Threshold Pulse Width: 0.4 ms

## 2013-07-18 ENCOUNTER — Ambulatory Visit: Payer: Medicare Other | Admitting: Cardiology

## 2013-07-24 ENCOUNTER — Other Ambulatory Visit: Payer: Self-pay | Admitting: *Deleted

## 2013-07-24 DIAGNOSIS — I4891 Unspecified atrial fibrillation: Secondary | ICD-10-CM

## 2013-07-24 MED ORDER — DILTIAZEM HCL ER COATED BEADS 240 MG PO CP24: 240.0000 mg | ORAL_CAPSULE | Freq: Every day | ORAL | Status: DC

## 2013-07-28 ENCOUNTER — Encounter: Payer: Self-pay | Admitting: Cardiology

## 2013-07-29 ENCOUNTER — Encounter: Payer: Self-pay | Admitting: Cardiology

## 2013-08-23 ENCOUNTER — Encounter: Payer: Self-pay | Admitting: Cardiology

## 2013-08-23 ENCOUNTER — Ambulatory Visit (INDEPENDENT_AMBULATORY_CARE_PROVIDER_SITE_OTHER): Payer: Medicare Other | Admitting: Cardiology

## 2013-08-23 VITALS — BP 128/79 | HR 61 | Ht 70.0 in | Wt 196.4 lb

## 2013-08-23 DIAGNOSIS — I441 Atrioventricular block, second degree: Secondary | ICD-10-CM

## 2013-08-23 DIAGNOSIS — N183 Chronic kidney disease, stage 3 unspecified: Secondary | ICD-10-CM

## 2013-08-23 DIAGNOSIS — I4891 Unspecified atrial fibrillation: Secondary | ICD-10-CM

## 2013-08-23 DIAGNOSIS — I10 Essential (primary) hypertension: Secondary | ICD-10-CM

## 2013-08-23 DIAGNOSIS — Z79899 Other long term (current) drug therapy: Secondary | ICD-10-CM

## 2013-08-23 DIAGNOSIS — I2510 Atherosclerotic heart disease of native coronary artery without angina pectoris: Secondary | ICD-10-CM

## 2013-08-23 DIAGNOSIS — I5022 Chronic systolic (congestive) heart failure: Secondary | ICD-10-CM

## 2013-08-23 NOTE — Assessment & Plan Note (Signed)
Multivessel disease, most recently with DES to the left main in September 2014. Would not interrupt DAPT at this point.

## 2013-08-23 NOTE — Assessment & Plan Note (Signed)
With history of symptomatic bradycardia status post leadless St. Jude pacemaker, followed by Dr. Allred. 

## 2013-08-23 NOTE — Assessment & Plan Note (Signed)
Continue current regimen including Cardizem CD 240 mg daily and metoprolol 75 mg 3 times a day. He remains on Coumadin.

## 2013-08-23 NOTE — Patient Instructions (Signed)
   Lab for BMET - do today Office will contact with results via phone or letter.   Continue all current medications. Weigh daily & keep diary to bring to next office visit Log heart rate & keep diary to bring to next office visit Lab for BMET - do just prior to next office visit Follow up in  4-6 weeks

## 2013-08-23 NOTE — Progress Notes (Signed)
Clinical Summary Mr. Hellmer is a medically complex 80 y.o.male last seen in October 2014. Interval followup with Dr. Rayann Heman noted in December. Device was interrogated and Cardizem CD was increased to 240 mg daily to try to obtain better heart rate control. Lasix was also increased to 40 mg daily.  Record review finds hospitalization at [facility] in December with acute on chronic combined heart failure symptoms in the setting of atrial fibrillation. He was diuresis with intravenous Lasix and ultimately changed to oral Demadex as an outpatient. Lab work from December 16 showed BUN 30, creatinine 1.5, potassium 4.1, BNP 603. Chest x-ray done in December showed cardiomegaly with mild interstitial edema.  He is here today with his wife and daughter. States that he is weak but feeling better in general. He also had a recent URI. Weight is down 14 pounds since December.   He has not been weighing himself daily at home. We discussed the importance of reliable daily weights in adjusting his medical regimen. Reportedly his fastest record heart rate recently was 100 beats per minute after he was doing some activity. Leg edema is much better.   Allergies  Allergen Reactions  . Colcrys [Colchicine]     Skin irritation.   . Contrast Media [Iodinated Diagnostic Agents] Other (See Comments)    Affected kidneys (yellow dye, pigment blue)  . Dabigatran Other (See Comments)    REACTION: Bleeding-low HGB  . Penicillins Swelling    Face swelling    Current Outpatient Prescriptions  Medication Sig Dispense Refill  . Ascorbic Acid (VITAMIN C) 1000 MG tablet Take 1,000 mg by mouth daily.      Marland Kitchen aspirin EC 81 MG EC tablet Take 1 tablet (81 mg total) by mouth daily.  30 tablet  0  . atorvastatin (LIPITOR) 10 MG tablet Take 1 tablet (10 mg total) by mouth daily.  30 tablet  6  . clopidogrel (PLAVIX) 75 MG tablet Take 1 tablet (75 mg total) by mouth daily with breakfast.  30 tablet  6  . diltiazem (CARDIZEM CD)  240 MG 24 hr capsule Take 1 capsule (240 mg total) by mouth daily.  30 capsule  6  . ferrous sulfate (FEOSOL) 325 (65 FE) MG tablet Take 325 mg by mouth daily.      . fluticasone (FLONASE) 50 MCG/ACT nasal spray Place 2 sprays into both nostrils daily.      Marland Kitchen guaiFENesin (MUCINEX) 600 MG 12 hr tablet Take 600 mg by mouth 2 (two) times daily as needed for congestion.      Marland Kitchen LANTUS 100 UNIT/ML injection Inject 25 Units into the skin at bedtime.      . metoprolol tartrate (LOPRESSOR) 50 MG tablet Take 1.5 tablets (75 mg total) by mouth 3 (three) times daily.  135 tablet  6  . nitroGLYCERIN (NITROSTAT) 0.4 MG SL tablet Place 1 tablet (0.4 mg total) under the tongue every 5 (five) minutes as needed for chest pain.  25 tablet  3  . torsemide (DEMADEX) 20 MG tablet Take 20 mg by mouth daily.      Marland Kitchen warfarin (COUMADIN) 2 MG tablet Managed by Dr. Nadara Mustard.  Take as directed by the coumadin clinic       No current facility-administered medications for this visit.    Past Medical History  Diagnosis Date  . Persistent atrial fibrillation     a. Failed prior DCCV x 2; b. chronic coumadin.  . Essential hypertension, benign   . Mixed hyperlipidemia   .  Chronic systolic heart failure     a. EF 40-45% by echo 2012.  . GI bleed     December 2011 - subsequent discontinuation of Pradaxa  . Right bundle branch block   . Coronary atherosclerosis of native coronary artery     a. s/p MI 1998;  b. Chronically occluded LAD, patent DES diagonal, subtotally occluded anomalous circumflex - failed prior PCI;  b. 04/2013 Cath/PCI: LM 95d (3.0x16 Promus Premier DES), LAD 00, D1 patent stent, LCX small 100, RCA min irregs, R->L Collats.  . Bradycardia     a. s/p SJM nanostim leadless pacemaker implanted by Dr Rayann Heman  . Ischemic cardiomyopathy     LVEF 45%  . Pacemaker 01/18/2013    STJ Nanostim Leadless pacemaker implanted by Dr Rayann Heman  . History of pneumonia 1970's  . Type II diabetes mellitus   . Anemia   . History  of blood transfusion 09/2010; 12/2012  . CVA (cerebral vascular accident) 09/2010    Suspected embolic while off anticoagulation.  . Arthritis   . CKD (chronic kidney disease) stage 3, GFR 30-59 ml/min     Past Surgical History  Procedure Laterality Date  . Pacemaker insertion  01/18/2013    STJ Nanostim Leadless pacemaker implanted by Dr Rayann Heman  . Lumbar disc surgery  2000's  . Coronary angioplasty  1998  . Coronary angioplasty with stent placement  2007    "1" (01/18/2013)  . Cystectomy  1951    "off my neck" (01/18/2013)    Social History Mr. Surti reports that he quit smoking about 44 years ago. His smoking use included Cigarettes. He has a 12 pack-year smoking history. He has never used smokeless tobacco. Mr. Dalomba reports that he does not drink alcohol.  Review of Systems No fevers or chills. Cough is improved. No orthopnea. Otherwise as outlined.  Physical Examination Filed Vitals:   08/23/13 1005  BP: 128/79  Pulse: 61   Filed Weights   08/23/13 1005  Weight: 196 lb 6.4 oz (89.086 kg)    No acute distress. Appears comfortable.  HEENT: Conjunctiva and lids normal, oropharynx clear.  Neck: Supple, no elevated JVP or carotid bruits, no thyromegaly.  Lungs: Clear to auscultation, diminished, nonlabored breathing at rest.  Cardiac: Irregularly irregular and rapid, no S3, indistinct PMI.  Abdomen: Soft, nontender, bowel sounds present, no guarding or rebound.  Extremities: Trace edema, distal pulses 2+.  Musculoskeletal: No kyphosis. Skin: Warm and dry. He did cut his left great toe recently while trimming his toenails, appears clean, not infected. Neuropsychiatric: Alert and oriented x3, hard of hearing, affect appropriate.   Problem List and Plan   Chronic combined systolic and diastolic heart failure Recent episode requiring hospitalization. Weight is down 14 pounds by our scales and he does feel better with less edema. Medications reviewed. For now will continue  Demadex 20 mg daily, followup BMET to reassess renal function. We discussed the importance of weighing himself daily, also keep an eye on heart rate control. Sodium restriction important. Followup arranged for the next 4-6 weeks.  Atrial fibrillation Continue current regimen including Cardizem CD 240 mg daily and metoprolol 75 mg 3 times a day. He remains on Coumadin.  CAD, NATIVE VESSEL Multivessel disease, most recently with DES to the left main in September 2014. Would not interrupt DAPT at this point.  Essential hypertension, benign Blood pressure is reasonable today.  Second degree Mobitz II AV block With history of symptomatic bradycardia status post leadless St. Jude pacemaker, followed by  Dr. Rayann Heman.  CKD (chronic kidney disease), stage III Creatinine 1.5 in December, for followup BMET.    Satira Sark, M.D., F.A.C.C.

## 2013-08-23 NOTE — Assessment & Plan Note (Signed)
Blood pressure is reasonable today. 

## 2013-08-23 NOTE — Assessment & Plan Note (Signed)
Recent episode requiring hospitalization. Weight is down 14 pounds by our scales and he does feel better with less edema. Medications reviewed. For now will continue Demadex 20 mg daily, followup BMET to reassess renal function. We discussed the importance of weighing himself daily, also keep an eye on heart rate control. Sodium restriction important. Followup arranged for the next 4-6 weeks.

## 2013-08-23 NOTE — Assessment & Plan Note (Signed)
Creatinine 1.5 in December, for followup BMET.

## 2013-08-27 ENCOUNTER — Encounter: Payer: Self-pay | Admitting: Internal Medicine

## 2013-08-30 ENCOUNTER — Telehealth: Payer: Self-pay | Admitting: *Deleted

## 2013-08-30 NOTE — Telephone Encounter (Signed)
Notes Recorded by Laurine Blazer, LPN on 5/63/8937 at 3:42 PM Patient notified and verbalized understanding.

## 2013-08-30 NOTE — Telephone Encounter (Signed)
Message copied by Laurine Blazer on Fri Aug 30, 2013  3:39 PM ------      Message from: MCDOWELL, Aloha Gell      Created: Fri Aug 30, 2013 10:03 AM       Reviewed. Potassium normal at 4.6, creatinine has increased somewhat to 1.7, previously 1.5. For now would continue current dose of Demadex. Continue to follow weight. We can determine if diuretic dose needs to be adjusted at his next visit. ------

## 2013-10-03 ENCOUNTER — Encounter: Payer: Self-pay | Admitting: Cardiology

## 2013-10-03 NOTE — Progress Notes (Signed)
Clinical Summary [name] [name] is a medically cvomplex 80 y.o.male last seen in January. He is here with his daughter today. He tells me on the one hand that he has been feeling good, his daughter thinks he has been doing the best he has done quite a while since his hospitalization in December. On the other hand, he also tells me that his heart rate has been elevated for nearly the last month. He did not bring in home weight measurements, blood pressure measurements, or heart rate. I reviewed his medications in detail and discussed their indications. In speaking with him, it is not  clear that he has been taking his medicines regularly, particularly seems to miss the middle of the day Lopressor dose.  He tells that his weight has been gradually increasing, he has not taken any additional Demadex which we had discussed at the last visit. By our scales his weight is up 10 pounds.  Labwork from 08/23/13 showed creatinine up to 1.7 from 1.5, potassium 4.6, sodium 133. He has not yet had followup lab work.  Frankly, it has been difficult to optimally manage [name] [name] in terms of his heart rate control and volume status. I continue to discuss with him the importance of sodium restriction, fluid restriction, maintaining records of daily weights, intermittent intensification of diuretic use based on increasing weight of 2-3 pounds, also regular use of his heart rate control medications. He has had hospitalizations related to decompensated heart failure in the setting of rapid atrial fibrillation. Typically, we have been able to control his heart rate under observation on similar medications. We discussed all these issues again today in the presence of his daughter. He seems to listen to what I am saying, but at times tends to talk over me.  I explained to the patient and his daughter today that I was concerned he was not getting his medications regularly enough to provide adequate heart rate control, and we also  revisited the issue of adjusting Demadex dose to try and preclude weight gain.  ECG today shows rapid atrial fibrillation with right bundle branch block and left anterior fascicular block.   Allergies  Allergen Reactions  . Colcrys [Colchicine]     Skin irritation.   . Contrast Media [Iodinated Diagnostic Agents] Other (See Comments)    Affected kidneys (yellow dye, pigment blue)  . Dabigatran Other (See Comments)    REACTION: Bleeding-low HGB  . Penicillins Swelling    Face swelling    Current Outpatient Prescriptions  Medication Sig Dispense Refill  . Ascorbic Acid (VITAMIN C) 1000 MG tablet Take 1,000 mg by mouth daily.      Marland Kitchen aspirin EC 81 MG EC tablet Take 1 tablet (81 mg total) by mouth daily.  30 tablet  0  . atorvastatin (LIPITOR) 10 MG tablet Take 1 tablet (10 mg total) by mouth daily.  30 tablet  6  . clopidogrel (PLAVIX) 75 MG tablet Take 1 tablet (75 mg total) by mouth daily with breakfast.  30 tablet  6  . diltiazem (CARDIZEM CD) 240 MG 24 hr capsule Take 1 capsule (240 mg total) by mouth daily.  30 capsule  6  . ferrous sulfate (FEOSOL) 325 (65 FE) MG tablet Take 325 mg by mouth daily.      . fluticasone (FLONASE) 50 MCG/ACT nasal spray Place 2 sprays into both nostrils daily.      Marland Kitchen guaiFENesin (MUCINEX) 600 MG 12 hr tablet Take 600 mg by mouth 2 (two)  times daily as needed for congestion.      Marland Kitchen LANTUS 100 UNIT/ML injection Inject 25 Units into the skin at bedtime.      . metoprolol tartrate (LOPRESSOR) 50 MG tablet Take 1.5 tablets (75 mg total) by mouth 3 (three) times daily.  135 tablet  6  . nitroGLYCERIN (NITROSTAT) 0.4 MG SL tablet Place 1 tablet (0.4 mg total) under the tongue every 5 (five) minutes as needed for chest pain.  25 tablet  3  . torsemide (DEMADEX) 20 MG tablet Take 20 mg by mouth daily.      Marland Kitchen warfarin (COUMADIN) 2 MG tablet Managed by Dr. Nadara Mustard.  Take as directed by the coumadin clinic       No current facility-administered medications for this  visit.    Past Medical History  Diagnosis Date  . Persistent atrial fibrillation     a. Failed prior DCCV x 2; b. chronic coumadin.  . Essential hypertension, benign   . Mixed hyperlipidemia   . Chronic systolic heart failure     a. EF 40-45% by echo 2012.  . GI bleed     December 2011 - subsequent discontinuation of Pradaxa  . Right bundle branch block   . Coronary atherosclerosis of native coronary artery     a. s/p MI 1998;  b. Chronically occluded LAD, patent DES diagonal, subtotally occluded anomalous circumflex - failed prior PCI;  b. 04/2013 Cath/PCI: LM 95d (3.0x16 Promus Premier DES), LAD 00, D1 patent stent, LCX small 100, RCA min irregs, R->L Collats.  . Bradycardia     a. s/p SJM nanostim leadless pacemaker implanted by Dr Rayann Heman  . Ischemic cardiomyopathy     LVEF 45%  . Pacemaker 01/18/2013    STJ Nanostim Leadless pacemaker implanted by Dr Rayann Heman  . History of pneumonia 1970's  . Type II diabetes mellitus   . Anemia   . History of blood transfusion 09/2010; 12/2012  . CVA (cerebral vascular accident) 09/2010    Suspected embolic while off anticoagulation.  . Arthritis   . CKD (chronic kidney disease) stage 3, GFR 30-59 ml/min     Social History [name] [name] reports that he quit smoking about 44 years ago. His smoking use included Cigarettes. He has a 12 pack-year smoking history. He has never used smokeless tobacco. [name] [name] reports that he does not drink alcohol.  Review of Systems As outlined above.  Physical Examination Filed Vitals:   10/04/13 1156  BP: 137/85  Pulse: 139   Filed Weights   10/04/13 1156  Weight: 207 lb (93.895 kg)    No acute distress. Appears comfortable.  HEENT: Conjunctiva and lids normal, oropharynx clear.  Neck: Supple, no elevated JVP or carotid bruits, no thyromegaly.  Lungs: Clear to auscultation, diminished, nonlabored breathing at rest.  Cardiac: Rapid irregularly irregular, no S3, indistinct PMI.  Abdomen: Soft,  nontender, bowel sounds present, no guarding or rebound.  Extremities: 1-2+ edema, distal pulses 2+.  Musculoskeletal: No kyphosis.  Skin: Warm and dry. Neuropsychiatric: Alert and oriented x3, hard of hearing, affect appropriate.   Problem List and Plan   Atrial fibrillation Rapid rates again noted today. There does not seem to be any specific pattern to this, and we have achieved adequate heart rate control of his atrial fibrillation with similar medications and doses in the past. I am concerned that he may not not be taking his medications regularly, and discussed this with the patient and his daughter today. We came up with a  schedule for him to take his medications, generally trying to adhere to an 8 AM, 2 PM, and 8 PM dose time. Rather than continuing to up titrate doses of his current medications, we will try this strategy first and see him back in the office. I asked them to bring in records regarding home blood pressure and heart rate checks as well as weights for his next visit.  Chronic combined systolic and diastolic heart failure Weight has increased by 10 pounds. He did not use any additional Demadex with increasing weight since the last visit. I reinforced this strategy again today. He will take Demadex 40 mg daily for the next 4 days and return to 20 mg daily, using an additional dose for increasing weight of 2-3 pounds within a 24-hour to 48 hour period. Followup BMET for next visit.  CAD, NATIVE VESSEL Multivessel disease, most recently with DES to the left main in September 2014. Would not interrupt DAPT at this point.  CKD (chronic kidney disease), stage III Last creatinine 1.7 with normal potassium.  Second degree Mobitz II AV block With history of symptomatic bradycardia status post leadless St. Jude pacemaker, followed by Dr. Rayann Heman.    Satira Sark, M.D., F.A.C.C.

## 2013-10-04 ENCOUNTER — Encounter: Payer: Self-pay | Admitting: Cardiology

## 2013-10-04 ENCOUNTER — Ambulatory Visit (INDEPENDENT_AMBULATORY_CARE_PROVIDER_SITE_OTHER): Payer: Medicare Other | Admitting: Cardiology

## 2013-10-04 VITALS — BP 137/85 | HR 139 | Ht 70.0 in | Wt 207.0 lb

## 2013-10-04 DIAGNOSIS — I5042 Chronic combined systolic (congestive) and diastolic (congestive) heart failure: Secondary | ICD-10-CM

## 2013-10-04 DIAGNOSIS — N183 Chronic kidney disease, stage 3 unspecified: Secondary | ICD-10-CM

## 2013-10-04 DIAGNOSIS — I441 Atrioventricular block, second degree: Secondary | ICD-10-CM

## 2013-10-04 DIAGNOSIS — I2510 Atherosclerotic heart disease of native coronary artery without angina pectoris: Secondary | ICD-10-CM

## 2013-10-04 DIAGNOSIS — I4891 Unspecified atrial fibrillation: Secondary | ICD-10-CM

## 2013-10-04 NOTE — Assessment & Plan Note (Signed)
Rapid rates again noted today. There does not seem to be any specific pattern to this, and we have achieved adequate heart rate control of his atrial fibrillation with similar medications and doses in the past. I am concerned that he may not not be taking his medications regularly, and discussed this with the patient and his daughter today. We came up with a schedule for him to take his medications, generally trying to adhere to an 8 AM, 2 PM, and 8 PM dose time. Rather than continuing to up titrate doses of his current medications, we will try this strategy first and see him back in the office. I asked them to bring in records regarding home blood pressure and heart rate checks as well as weights for his next visit.

## 2013-10-04 NOTE — Assessment & Plan Note (Signed)
Multivessel disease, most recently with DES to the left main in September 2014. Would not interrupt DAPT at this point. 

## 2013-10-04 NOTE — Assessment & Plan Note (Signed)
Weight has increased by 10 pounds. He did not use any additional Demadex with increasing weight since the last visit. I reinforced this strategy again today. He will take Demadex 40 mg daily for the next 4 days and return to 20 mg daily, using an additional dose for increasing weight of 2-3 pounds within a 24-hour to 48 hour period. Followup BMET for next visit.

## 2013-10-04 NOTE — Patient Instructions (Addendum)
Your physician recommends that you schedule a follow-up appointment in: 2-3 weeks. Your physician has recommended you make the following change in your medication: TAKE 40 MG TORSEMIDE DAILY (2 OF YOUR 20 MG TABLETS) FOR 4 DAYS, THEN; RESUME 20 MG DAILY. ALL OTHER MEDICATIONS WILL REMAIN THE SAME. PLEASE TAKE YOUR LOPRESSOR/METOPROLOL TARTRATE @ 8 AM   2 PM  &  8 PM EVERYDAY. PLEASE RECORD YOUR WEIGHTS DAILY. WEIGHT AT THE SAME TIME, SAME SCALE AND SAME AMOUNT OF CLOTHING DAILY. BRING RESULTS TO YOUR NEXT VISIT. PLEASE HAVE YOUR BLOOD WORK DONE JUST BEFORE YOUR NEXT VISIT TO CHECK YOUR BMET.

## 2013-10-04 NOTE — Assessment & Plan Note (Signed)
With history of symptomatic bradycardia status post leadless St. Jude pacemaker, followed by Dr. Rayann Heman.

## 2013-10-04 NOTE — Assessment & Plan Note (Signed)
Last creatinine 1.7 with normal potassium.

## 2013-10-17 ENCOUNTER — Telehealth: Payer: Self-pay | Admitting: *Deleted

## 2013-10-17 NOTE — Telephone Encounter (Signed)
Received call from lab to report critically elevated glucose of 470.

## 2013-10-17 NOTE — Telephone Encounter (Signed)
Wife informed and lab report faxed to Dr. Michel Bickers office.

## 2013-10-17 NOTE — Telephone Encounter (Signed)
Spoke with Bubba Hales at Dr. Lyman Speller office and informed her of glucose results and advised her that report would be sent to their office to f/u and patient would be notified.

## 2013-10-21 ENCOUNTER — Encounter: Payer: Self-pay | Admitting: Cardiology

## 2013-10-21 ENCOUNTER — Ambulatory Visit (INDEPENDENT_AMBULATORY_CARE_PROVIDER_SITE_OTHER): Payer: Medicare Other | Admitting: Cardiology

## 2013-10-21 VITALS — BP 150/74 | HR 55 | Ht 70.0 in | Wt 202.0 lb

## 2013-10-21 DIAGNOSIS — I2510 Atherosclerotic heart disease of native coronary artery without angina pectoris: Secondary | ICD-10-CM

## 2013-10-21 DIAGNOSIS — I441 Atrioventricular block, second degree: Secondary | ICD-10-CM

## 2013-10-21 DIAGNOSIS — I4891 Unspecified atrial fibrillation: Secondary | ICD-10-CM

## 2013-10-21 DIAGNOSIS — I5042 Chronic combined systolic (congestive) and diastolic (congestive) heart failure: Secondary | ICD-10-CM

## 2013-10-21 NOTE — Patient Instructions (Signed)
Your physician recommends that you schedule a follow-up appointment in: 3 months. Your physician recommends that you continue on your current medications as directed. Please refer to the Current Medication list given to you today. 

## 2013-10-21 NOTE — Assessment & Plan Note (Signed)
Weight has come down. I reinforced checking daily weights, fluid and sodium restriction, also use of his diuretics.

## 2013-10-21 NOTE — Assessment & Plan Note (Signed)
Status post St. Jude leadless pacemaker, followed by Dr. Allred. 

## 2013-10-21 NOTE — Assessment & Plan Note (Signed)
Heart rate is much better controlled. No change to current regimen. I reinforced medication compliance the patient and his family.

## 2013-10-21 NOTE — Progress Notes (Signed)
Clinical Summary Mr. Hair is a medically complex 80 y.o.male last seen in late February. At that time heart rate control in atrial fibrillation was not adequate and his weight had increased. We reviewed his medications, discussed adequate compliance, determined a schedule for him to take his medications with the help of his daughter, also intensified his diuretic regimen.  He returns today with his wife and daughter, continues to feel well. His heart rate is much better controlled and his weight is down,I reviewed his home records. He states that he is taking his medicines on a regular basis. They decided to set his cell phone to a alarm at the times he is to take his regular medicines. This seems to be working for him.  Recent followup lab work showed BUN 32, creatinine 1.6, potassium 4.3, sodium 135. Blood sugar was significantly elevated, copy sent to primary care provider. Mr. Wrightsman has not been following a low carbohydrate diet, enjoys ice cream.  Weight is down 5 pounds from her recent visit.   Allergies  Allergen Reactions  . Colcrys [Colchicine]     Skin irritation.   . Contrast Media [Iodinated Diagnostic Agents] Other (See Comments)    Affected kidneys (yellow dye, pigment blue)  . Dabigatran Other (See Comments)    REACTION: Bleeding-low HGB  . Penicillins Swelling    Face swelling    Current Outpatient Prescriptions  Medication Sig Dispense Refill  . Ascorbic Acid (VITAMIN C) 1000 MG tablet Take 1,000 mg by mouth daily.      Marland Kitchen aspirin EC 81 MG EC tablet Take 1 tablet (81 mg total) by mouth daily.  30 tablet  0  . atorvastatin (LIPITOR) 10 MG tablet Take 1 tablet (10 mg total) by mouth daily.  30 tablet  6  . clopidogrel (PLAVIX) 75 MG tablet Take 1 tablet (75 mg total) by mouth daily with breakfast.  30 tablet  6  . diltiazem (CARDIZEM CD) 240 MG 24 hr capsule Take 1 capsule (240 mg total) by mouth daily.  30 capsule  6  . ferrous sulfate (FEOSOL) 325 (65 FE) MG  tablet Take 325 mg by mouth daily.      . fluticasone (FLONASE) 50 MCG/ACT nasal spray Place 2 sprays into both nostrils daily.      Marland Kitchen guaiFENesin (MUCINEX) 600 MG 12 hr tablet Take 600 mg by mouth 2 (two) times daily as needed for congestion.      Marland Kitchen LANTUS 100 UNIT/ML injection Inject 25 Units into the skin at bedtime.      . metoprolol tartrate (LOPRESSOR) 50 MG tablet Take 1.5 tablets (75 mg total) by mouth 3 (three) times daily.  135 tablet  6  . nitroGLYCERIN (NITROSTAT) 0.4 MG SL tablet Place 1 tablet (0.4 mg total) under the tongue every 5 (five) minutes as needed for chest pain.  25 tablet  3  . torsemide (DEMADEX) 20 MG tablet Take 20 mg by mouth daily.      Marland Kitchen warfarin (COUMADIN) 2 MG tablet Managed by Dr. Nadara Mustard.  Take as directed by the coumadin clinic       No current facility-administered medications for this visit.    Past Medical History  Diagnosis Date  . Persistent atrial fibrillation     a. Failed prior DCCV x 2; b. chronic coumadin.  . Essential hypertension, benign   . Mixed hyperlipidemia   . Chronic systolic heart failure     a. EF 40-45% by echo 2012.  Marland Kitchen GI  bleed     December 2011 - subsequent discontinuation of Pradaxa  . Right bundle branch block   . Coronary atherosclerosis of native coronary artery     a. s/p MI 1998;  b. Chronically occluded LAD, patent DES diagonal, subtotally occluded anomalous circumflex - failed prior PCI;  b. 04/2013 Cath/PCI: LM 95d (3.0x16 Promus Premier DES), LAD 00, D1 patent stent, LCX small 100, RCA min irregs, R->L Collats.  . Bradycardia     a. s/p SJM nanostim leadless pacemaker implanted by Dr Rayann Heman  . Ischemic cardiomyopathy     LVEF 45%  . Pacemaker 01/18/2013    STJ Nanostim Leadless pacemaker implanted by Dr Rayann Heman  . History of pneumonia 1970's  . Type II diabetes mellitus   . Anemia   . History of blood transfusion 09/2010; 12/2012  . CVA (cerebral vascular accident) 09/2010    Suspected embolic while off  anticoagulation.  . Arthritis   . CKD (chronic kidney disease) stage 3, GFR 30-59 ml/min     Social History Mr. Shular reports that he quit smoking about 44 years ago. His smoking use included Cigarettes. He has a 12 pack-year smoking history. He has never used smokeless tobacco. Mr. Goldston reports that he does not drink alcohol.  Review of Systems Has lower back pain. Otherwise reviewed and negative.  Physical Examination Filed Vitals:   10/21/13 1446  BP: 150/74  Pulse: 55   Filed Weights   10/21/13 1446  Weight: 202 lb (91.627 kg)    No acute distress. Appears comfortable.  HEENT: Conjunctiva and lids normal, oropharynx clear.  Neck: Supple, no elevated JVP or carotid bruits, no thyromegaly.  Lungs: Clear to auscultation, diminished, nonlabored breathing at rest.  Cardiac: Rapid irregularly irregular, no S3, indistinct PMI.  Abdomen: Soft, nontender, bowel sounds present, no guarding or rebound.  Extremities: 1-2+ edema, distal pulses 2+.  Musculoskeletal: No kyphosis.  Skin: Warm and dry.  Neuropsychiatric: Alert and oriented x3, hard of hearing, affect appropriate.   Problem List and Plan   Atrial fibrillation Heart rate is much better controlled. No change to current regimen. I reinforced medication compliance the patient and his family.  Second degree Mobitz II AV block Status post St. Jude leadless pacemaker, followed by Dr. Rayann Heman.  Chronic combined systolic and diastolic heart failure Weight has come down. I reinforced checking daily weights, fluid and sodium restriction, also use of his diuretics.  CAD, NATIVE VESSEL Multivessel disease, most recently with DES to the left main in September 2014. Would not interrupt DAPT at this point.    Satira Sark, M.D., F.A.C.C.

## 2013-10-21 NOTE — Assessment & Plan Note (Signed)
Multivessel disease, most recently with DES to the left main in September 2014. Would not interrupt DAPT at this point. 

## 2013-11-04 ENCOUNTER — Other Ambulatory Visit: Payer: Self-pay | Admitting: Nurse Practitioner

## 2013-12-03 ENCOUNTER — Telehealth: Payer: Self-pay | Admitting: *Deleted

## 2013-12-03 NOTE — Telephone Encounter (Signed)
Report stated that patient's scales showed a 10.4lb weight increase over the past 5 days. Also said patient reported pedal swelling,some sob and cough.   Nurse called and spoke with patient to check on his symptoms and patient reported to nurse that he weighed 206 lbs dry weight today. Patient did report swelling in feet that was the usual amount for him. Patient denied sob or chest pain. Patient did report to nurse that he did see the PA at Dr. Lyman Speller office today and she increased his fluid pill. Patient spelled the fluid pill that was increased which was furosemide. Patient said he didn't realize until he got home that he wasn't on this medication. Patient informed nurse that he was on torsemide 20 mg daily. Patient said he didn't inform their office that he wasn't on that fluid pill neither did he inform their office of which fluid pill he was actually taking. Patient said he was going to take the torsemide in the morning and the furosemide in the evening. Nurse advised patient that this was incorrect without permission of a provider. Patient informed nurse that the PA also ordered a kidney functioning test on him today and he was awaiting results of this.

## 2013-12-03 NOTE — Telephone Encounter (Signed)
Please see my last 2 office notes on this patient. Controlling both his atrial fibrillation and also volume status has been difficult, and compliance has been an issue. I would recommend that he take Demadex 40 mg daily for the next 4 days, and then go back to taking Demadex 20 mg each day. This same regimen was affective previously in helping him to lose 10 pounds of fluid weight. Would not use Demadex and Lasix together.

## 2013-12-03 NOTE — Telephone Encounter (Signed)
Patient informed and verbalized understanding of plan. Copy sent to Dr. Lyman Speller office for continuity.

## 2013-12-09 ENCOUNTER — Other Ambulatory Visit: Payer: Self-pay | Admitting: Nurse Practitioner

## 2014-01-17 ENCOUNTER — Encounter: Payer: Self-pay | Admitting: Internal Medicine

## 2014-01-17 ENCOUNTER — Ambulatory Visit (INDEPENDENT_AMBULATORY_CARE_PROVIDER_SITE_OTHER): Payer: Medicare Other | Admitting: Internal Medicine

## 2014-01-17 VITALS — BP 135/84 | HR 92 | Ht 69.5 in | Wt 208.2 lb

## 2014-01-17 DIAGNOSIS — I441 Atrioventricular block, second degree: Secondary | ICD-10-CM

## 2014-01-17 DIAGNOSIS — I2510 Atherosclerotic heart disease of native coronary artery without angina pectoris: Secondary | ICD-10-CM

## 2014-01-17 DIAGNOSIS — I5042 Chronic combined systolic (congestive) and diastolic (congestive) heart failure: Secondary | ICD-10-CM

## 2014-01-17 DIAGNOSIS — I10 Essential (primary) hypertension: Secondary | ICD-10-CM

## 2014-01-17 DIAGNOSIS — I4891 Unspecified atrial fibrillation: Secondary | ICD-10-CM

## 2014-01-17 LAB — MDC_IDC_ENUM_SESS_TYPE_INCLINIC
Battery Remaining Longevity: 10
Implantable Pulse Generator Serial Number: 4248
Lead Channel Pacing Threshold Amplitude: 0.25 V
Lead Channel Sensing Intrinsic Amplitude: 2 mV
Lead Channel Setting Sensing Sensitivity: 2 mV
MDC IDC MSMT LEADCHNL RV IMPEDANCE VALUE: 500 Ohm
MDC IDC MSMT LEADCHNL RV PACING THRESHOLD PULSEWIDTH: 0.4 ms
MDC IDC SET LEADCHNL RV PACING AMPLITUDE: 2.5 V
MDC IDC SET LEADCHNL RV PACING PULSEWIDTH: 0.4 ms

## 2014-01-17 MED ORDER — DILTIAZEM HCL ER COATED BEADS 360 MG PO CP24: 360.0000 mg | ORAL_CAPSULE | Freq: Every day | ORAL | Status: DC

## 2014-01-17 NOTE — Patient Instructions (Signed)
Your physician wants you to follow-up in: 6 months with research and 12 months with Dr Vallery Ridge will receive a reminder letter in the mail two months in advance. If you don't receive a letter, please call our office to schedule the follow-up appointment.   Your physician has recommended you make the following change in your medication:  1) increase Cardizem to 360mg  daily

## 2014-01-19 NOTE — Progress Notes (Signed)
PCP:  Rory Percy, MD Primary Cardiologist:  Dr Domenic Polite  The patient presents today for routine electrophysiology followup.  Since last being seen in our clinic, the patient reports doing reasonably well.  He remains active for his age.   Today, he denies symptoms of palpitations, chest pain, shortness of breath, orthopnea, PND, dizziness, presyncope, syncope, or neurologic sequela.  The patient feels that he is tolerating medications without difficulties and is otherwise without complaint today.   Past Medical History  Diagnosis Date  . Persistent atrial fibrillation     a. Failed prior DCCV x 2; b. chronic coumadin.  . Essential hypertension, benign   . Mixed hyperlipidemia   . Chronic systolic heart failure     a. EF 40-45% by echo 2012.  . GI bleed     December 2011 - subsequent discontinuation of Pradaxa  . Right bundle branch block   . Coronary atherosclerosis of native coronary artery     a. s/p MI 1998;  b. Chronically occluded LAD, patent DES diagonal, subtotally occluded anomalous circumflex - failed prior PCI;  b. 04/2013 Cath/PCI: LM 95d (3.0x16 Promus Premier DES), LAD 00, D1 patent stent, LCX small 100, RCA min irregs, R->L Collats.  . Bradycardia     a. s/p SJM nanostim leadless pacemaker implanted by Dr Rayann Heman  . Ischemic cardiomyopathy     LVEF 45%  . Pacemaker 01/18/2013    STJ Nanostim Leadless pacemaker implanted by Dr Rayann Heman  . History of pneumonia 1970's  . Type II diabetes mellitus   . Anemia   . History of blood transfusion 09/2010; 12/2012  . CVA (cerebral vascular accident) 09/2010    Suspected embolic while off anticoagulation.  . Arthritis   . CKD (chronic kidney disease) stage 3, GFR 30-59 ml/min    Past Surgical History  Procedure Laterality Date  . Pacemaker insertion  01/18/2013    STJ Nanostim Leadless pacemaker implanted by Dr Rayann Heman  . Lumbar disc surgery  2000's  . Coronary angioplasty  1998  . Coronary angioplasty with stent placement  2007     "1" (01/18/2013)  . Cystectomy  1951    "off my neck" (01/18/2013)    Current Outpatient Prescriptions  Medication Sig Dispense Refill  . Ascorbic Acid (VITAMIN C) 1000 MG tablet Take 1,000 mg by mouth daily.      Marland Kitchen aspirin EC 81 MG EC tablet Take 1 tablet (81 mg total) by mouth daily.  30 tablet  0  . atorvastatin (LIPITOR) 10 MG tablet TAKE ONE TABLET BY MOUTH EVERY DAY  30 tablet  6  . clopidogrel (PLAVIX) 75 MG tablet TAKE ONE TABLET BY MOUTH EVERY DAY WITH BREAKFAST  30 tablet  6  . diltiazem (CARDIZEM CD) 360 MG 24 hr capsule Take 1 capsule (360 mg total) by mouth daily.  90 capsule  3  . ferrous sulfate (FEOSOL) 325 (65 FE) MG tablet Take 325 mg by mouth daily.      Marland Kitchen guaiFENesin (MUCINEX) 600 MG 12 hr tablet Take 600 mg by mouth 2 (two) times daily as needed for congestion.      Marland Kitchen LANTUS 100 UNIT/ML injection Inject 25 Units into the skin at bedtime.      . metoprolol tartrate (LOPRESSOR) 50 MG tablet Take 1.5 tablets (75 mg total) by mouth 3 (three) times daily.  135 tablet  6  . nitroGLYCERIN (NITROSTAT) 0.4 MG SL tablet Place 1 tablet (0.4 mg total) under the tongue every 5 (five) minutes as needed  for chest pain.  25 tablet  3  . torsemide (DEMADEX) 20 MG tablet Take 20 mg by mouth daily.      Marland Kitchen warfarin (COUMADIN) 2 MG tablet Managed by Dr. Nadara Mustard.  Take as directed by the coumadin clinic       No current facility-administered medications for this visit.    Allergies  Allergen Reactions  . Colcrys [Colchicine]     Skin irritation.   . Contrast Media [Iodinated Diagnostic Agents] Other (See Comments)    Affected kidneys (yellow dye, pigment blue)  . Dabigatran Other (See Comments)    REACTION: Bleeding-low HGB  . Penicillins Swelling    Face swelling    History   Social History  . Marital Status: Married    Spouse Name: [name]    Number of Children: N/A  . Years of Education: N/A   Occupational History  . RETIRED   .     Social History Main Topics  . Smoking  status: Former Smoker -- 0.30 packs/day for 40 years    Types: Cigarettes    Quit date: 08/15/1969  . Smokeless tobacco: Never Used  . Alcohol Use: No  . Drug Use: No  . Sexual Activity: No   Other Topics Concern  . Not on file   Social History Narrative  . No narrative on file    Family History  Problem Relation Age of Onset  . Diabetes Other   . Hypertension Other     ROS-  All systems are reviewed and are negative except as outlined in the HPI above  Physical Exam: Filed Vitals:   01/17/14 1037  BP: 135/84  Pulse: 92  Height: 5' 9.5" (1.765 m)  Weight: 208 lb 3.2 oz (94.439 kg)    GEN- The patient is well appearing, alert and oriented x 3 today.   Head- normocephalic, atraumatic Eyes-  Sclera clear, conjunctiva pink Ears- hearing intact Oropharynx- clear Neck- supple, no JVP Lymph- no cervical lymphadenopathy Lungs- Clear to ausculation bilaterally, normal work of breathing Heart- irregular rhythm, 2/6 SEM LUSB, 2/6 SEM at the apex GI- soft, NT, ND, + BS Extremities- no clubbing, cyanosis, +2 edema Neuro- strength and sensation are intact  ekg today reveals afib, V rate 88 bpm, RBBB, LAHB Labs reviewed in epic Dr Chuck Hint note are also reviewed  Assessment and Plan:  1. Permanent afib V rates are frequently elevated Increase diltiazem to 360 mg daily Continue long term anticoagulation  2. Mobitz II second degree AV block Normal leadless pacemaker function See paceart  3. chronic diastolic dysfunction 2 gram sodium restriction  4. CAD I have reviewed Dr Doug Sou cath note which recommends stopping ASA after a month and possibly stopping plavix after a year.  The patient has a promus stent which carries a reduced risk of ISR long term.  I worry about bleeding risks in this elderly gentleman on triple anticoagulant therapy.    Consider stopping ASA and contuing plavix plus oral anticoagulation with coumadin I will defer this decision to Dr  Domenic Polite  He will follow-up with research in 6 months I will see in a year

## 2014-01-28 ENCOUNTER — Encounter: Payer: Self-pay | Admitting: Cardiology

## 2014-01-28 ENCOUNTER — Ambulatory Visit (INDEPENDENT_AMBULATORY_CARE_PROVIDER_SITE_OTHER): Payer: Medicare Other | Admitting: Cardiology

## 2014-01-28 VITALS — BP 120/67 | HR 84 | Ht 69.0 in | Wt 210.1 lb

## 2014-01-28 DIAGNOSIS — I5042 Chronic combined systolic (congestive) and diastolic (congestive) heart failure: Secondary | ICD-10-CM

## 2014-01-28 DIAGNOSIS — I2510 Atherosclerotic heart disease of native coronary artery without angina pectoris: Secondary | ICD-10-CM

## 2014-01-28 DIAGNOSIS — I4891 Unspecified atrial fibrillation: Secondary | ICD-10-CM

## 2014-01-28 DIAGNOSIS — I441 Atrioventricular block, second degree: Secondary | ICD-10-CM

## 2014-01-28 DIAGNOSIS — I10 Essential (primary) hypertension: Secondary | ICD-10-CM

## 2014-01-28 NOTE — Assessment & Plan Note (Signed)
Continue Cardizem CD 360 mg daily, Lopressor 75 mg 3 times a day, Coumadin.

## 2014-01-28 NOTE — Patient Instructions (Signed)
Your physician recommends that you schedule a follow-up appointment in: 3 months. Your physician recommends that you continue on your current medications as directed. Please refer to the Current Medication list given to you today. Please take your medications regularly the way that they are prescribed.  Your physician recommends that you weigh daily, at the same time every day, and in the same amount of clothing. Please record your daily weights. Your target weight is 200 lbs. Please use your demadex as directed for extra weight.

## 2014-01-28 NOTE — Assessment & Plan Note (Signed)
Blood pressure control is good today. 

## 2014-01-28 NOTE — Assessment & Plan Note (Signed)
Status post St. Jude leadless pacemaker, followed by Dr. Rayann Heman.

## 2014-01-28 NOTE — Assessment & Plan Note (Signed)
He is now nearly 9 months out from left main DES intervention. I asked him to stop aspirin, continue Plavix and Coumadin for now.

## 2014-01-28 NOTE — Progress Notes (Signed)
Clinical Summary [name] [name] is a medically complex 80 y.o.male last seen in March of this year. Interval office visit with Dr. Rayann Heman recently noted. At that time diltiazem CD was increased to 360 mg daily. It was suggested that the patient come off of aspirin, and continue on Plavix along with his Coumadin. He is approximately 9 months out from left main DES intervention.  Lab work from March of this year showed BUN 32, creatinine 1.6, potassium 4.3.  He is here with his wife and daughter. He states he feels fairly well most of the time, still gets short of breath with some activities, has chronic leg edema which his wife and daughter think is worse recently. His weight has gone up nearly 8 pounds since March.  We reviewed his medications. I continue to reinforce with him strict compliance with medication usage. He does not adjust his Demadex automatically with weight increases. We had to advance his diuretics by phone since I last saw him. Today we agreed that he would try an aim for a weight around 200 pounds where he seems to feel better.   Allergies  Allergen Reactions  . Colcrys [Colchicine]     Skin irritation.   . Contrast Media [Iodinated Diagnostic Agents] Other (See Comments)    Affected kidneys (yellow dye, pigment blue)  . Dabigatran Other (See Comments)    REACTION: Bleeding-low HGB  . Penicillins Swelling    Face swelling    Current Outpatient Prescriptions  Medication Sig Dispense Refill  . Ascorbic Acid (VITAMIN C) 1000 MG tablet Take 1,000 mg by mouth daily.      Marland Kitchen atorvastatin (LIPITOR) 10 MG tablet TAKE ONE TABLET BY MOUTH EVERY DAY  30 tablet  6  . clopidogrel (PLAVIX) 75 MG tablet TAKE ONE TABLET BY MOUTH EVERY DAY WITH BREAKFAST  30 tablet  6  . Difluprednate (DUREZOL) 0.05 % EMUL Apply 1 drop to eye 3 (three) times daily. Left eye      . diltiazem (CARDIZEM CD) 360 MG 24 hr capsule Take 1 capsule (360 mg total) by mouth daily.  90 capsule  3  . ferrous  sulfate (FEOSOL) 325 (65 FE) MG tablet Take 325 mg by mouth daily.      Marland Kitchen guaiFENesin (MUCINEX) 600 MG 12 hr tablet Take 600 mg by mouth 2 (two) times daily as needed for congestion.      Marland Kitchen ketorolac (ACULAR) 0.5 % ophthalmic solution Place 1 drop into the left eye 4 (four) times daily.      Marland Kitchen LANTUS 100 UNIT/ML injection Inject 25 Units into the skin at bedtime.      . metoprolol tartrate (LOPRESSOR) 50 MG tablet Take 1.5 tablets (75 mg total) by mouth 3 (three) times daily.  135 tablet  6  . nitroGLYCERIN (NITROSTAT) 0.4 MG SL tablet Place 1 tablet (0.4 mg total) under the tongue every 5 (five) minutes as needed for chest pain.  25 tablet  3  . ofloxacin (OCUFLOX) 0.3 % ophthalmic solution Place 1 drop into both eyes 4 (four) times daily.      Marland Kitchen torsemide (DEMADEX) 20 MG tablet Take 20-40 mg by mouth daily.       Marland Kitchen warfarin (COUMADIN) 2 MG tablet Managed by Dr. Nadara Mustard.  Take as directed by the coumadin clinic       No current facility-administered medications for this visit.    Past Medical History  Diagnosis Date  . Persistent atrial fibrillation     a.  Failed prior DCCV x 2; b. chronic coumadin.  . Essential hypertension, benign   . Mixed hyperlipidemia   . Chronic systolic heart failure     a. EF 40-45% by echo 2012.  . GI bleed     December 2011 - subsequent discontinuation of Pradaxa  . Right bundle branch block   . Coronary atherosclerosis of native coronary artery     a. s/p MI 1998;  b. Chronically occluded LAD, patent DES diagonal, subtotally occluded anomalous circumflex - failed prior PCI;  b. 04/2013 Cath/PCI: LM 95d (3.0x16 Promus Premier DES), LAD 00, D1 patent stent, LCX small 100, RCA min irregs, R->L Collats.  . Bradycardia     a. s/p SJM nanostim leadless pacemaker implanted by Dr Rayann Heman  . Ischemic cardiomyopathy     LVEF 45%  . Pacemaker 01/18/2013    STJ Nanostim Leadless pacemaker implanted by Dr Rayann Heman  . History of pneumonia 1970's  . Type II diabetes mellitus    . Anemia   . History of blood transfusion 09/2010; 12/2012  . CVA (cerebral vascular accident) 09/2010    Suspected embolic while off anticoagulation.  . Arthritis   . CKD (chronic kidney disease) stage 3, GFR 30-59 ml/min     Past Surgical History  Procedure Laterality Date  . Pacemaker insertion  01/18/2013    STJ Nanostim Leadless pacemaker implanted by Dr Rayann Heman  . Lumbar disc surgery  2000's  . Coronary angioplasty  1998  . Coronary angioplasty with stent placement  2007    "1" (01/18/2013)  . Cystectomy  1951    "off my neck" (01/18/2013)    Social History [name] [name] reports that he quit smoking about 44 years ago. His smoking use included Cigarettes. He has a 12 pack-year smoking history. He has never used smokeless tobacco. [name] [name] reports that he does not drink alcohol.  Review of Systems Reports easy bruising, no major bleeding episodes. No angina. No nitroglycerin use. Chronic leg edema. Good appetite. Other systems reviewed and negative.  Physical Examination Filed Vitals:   01/28/14 1052  BP: 120/67  Pulse: 84   Filed Weights   01/28/14 1052  Weight: 210 lb 1.9 oz (95.31 kg)    No acute distress. Appears comfortable.  HEENT: Conjunctiva and lids normal, oropharynx clear.  Neck: Supple, no elevated JVP or carotid bruits, no thyromegaly.  Lungs: Clear to auscultation, diminished, nonlabored breathing at rest.  Cardiac: Rapid irregularly irregular, no S3, indistinct PMI.  Abdomen: Soft, nontender, bowel sounds present, no guarding or rebound.  Extremities: 2+ edema, distal pulses 2+.  Musculoskeletal: No kyphosis.  Skin: Warm and dry.  Neuropsychiatric: Alert and oriented x3, hard of hearing, affect appropriate.   Problem List and Plan   Chronic combined systolic and diastolic heart failure Evidence of extra volume, weight is up 8 pounds since March. I once again reinforced medication compliance as well as sodium restriction and diet. He will increase his  Demadex to 40 mg daily for the next few days, should aim to keep his weight around 200 pounds where he seems to feel the best.  Atrial fibrillation Continue Cardizem CD 360 mg daily, Lopressor 75 mg 3 times a day, Coumadin.  Second degree Mobitz II AV block Status post St. Jude leadless pacemaker, followed by Dr. Rayann Heman.  CAD, NATIVE VESSEL He is now nearly 9 months out from left main DES intervention. I asked him to stop aspirin, continue Plavix and Coumadin for now.  Essential hypertension, benign Blood pressure control  is good today.    Satira Sark, M.D., F.A.C.C.

## 2014-01-28 NOTE — Assessment & Plan Note (Signed)
Evidence of extra volume, weight is up 8 pounds since March. I once again reinforced medication compliance as well as sodium restriction and diet. He will increase his Demadex to 40 mg daily for the next few days, should aim to keep his weight around 200 pounds where he seems to feel the best.

## 2014-03-10 ENCOUNTER — Other Ambulatory Visit: Payer: Self-pay | Admitting: *Deleted

## 2014-03-10 MED ORDER — METOPROLOL TARTRATE 50 MG PO TABS
75.0000 mg | ORAL_TABLET | Freq: Three times a day (TID) | ORAL | Status: DC
Start: 2014-03-10 — End: 2014-07-27

## 2014-05-05 ENCOUNTER — Encounter: Payer: Self-pay | Admitting: Cardiology

## 2014-05-05 ENCOUNTER — Ambulatory Visit (INDEPENDENT_AMBULATORY_CARE_PROVIDER_SITE_OTHER): Payer: Medicare Other | Admitting: Cardiology

## 2014-05-05 VITALS — BP 128/77 | HR 72 | Ht 69.0 in | Wt 209.0 lb

## 2014-05-05 DIAGNOSIS — I4891 Unspecified atrial fibrillation: Secondary | ICD-10-CM

## 2014-05-05 DIAGNOSIS — I10 Essential (primary) hypertension: Secondary | ICD-10-CM

## 2014-05-05 DIAGNOSIS — I482 Chronic atrial fibrillation, unspecified: Secondary | ICD-10-CM

## 2014-05-05 DIAGNOSIS — I2510 Atherosclerotic heart disease of native coronary artery without angina pectoris: Secondary | ICD-10-CM

## 2014-05-05 DIAGNOSIS — I441 Atrioventricular block, second degree: Secondary | ICD-10-CM

## 2014-05-05 NOTE — Progress Notes (Signed)
Clinical Summary [name] [name] is a medically complex 80 y.o.male last seen in June. He is here with his wife and a different daughter today than usually accompanies him. He states overall he feels "good" but still complains of intermittent dyspnea on exertion, leg edema, also leg fatigue when he stands up for long periods of time. He reports compliance with his medications although has started skipping his middle of the day Lopressor dose. I recommended that he continue things as we had ordered, particular since this has been successful in keeping his heart rate controlled and keeping him out of the hospital.  At the last visit, aspirin was stopped. Heart rate control regimen included Cardizem CD 360 mg daily and Lopressor 75 mg 3 times a day.  His wife states that he does have episodes where he "speaks out of his head" or "snaps" at her. He does not recall these events. She states it he is able to sleep after these events and feels better. He continued to see Dr. Nadara Mustard for primary care.  Denies any spontaneous bleeding problems.   Allergies  Allergen Reactions  . Colcrys [Colchicine]     Skin irritation.   . Contrast Media [Iodinated Diagnostic Agents] Other (See Comments)    Affected kidneys (yellow dye, pigment blue)  . Dabigatran Other (See Comments)    REACTION: Bleeding-low HGB  . Penicillins Swelling    Face swelling    Current Outpatient Prescriptions  Medication Sig Dispense Refill  . Ascorbic Acid (VITAMIN C) 1000 MG tablet Take 1,000 mg by mouth daily.      Marland Kitchen atorvastatin (LIPITOR) 10 MG tablet TAKE ONE TABLET BY MOUTH EVERY DAY  30 tablet  6  . clopidogrel (PLAVIX) 75 MG tablet TAKE ONE TABLET BY MOUTH EVERY DAY WITH BREAKFAST  30 tablet  6  . Difluprednate (DUREZOL) 0.05 % EMUL Apply 1 drop to eye 3 (three) times daily. Left eye      . diltiazem (CARDIZEM CD) 360 MG 24 hr capsule Take 1 capsule (360 mg total) by mouth daily.  90 capsule  3  . ferrous sulfate (FEOSOL)  325 (65 FE) MG tablet Take 325 mg by mouth daily.      Marland Kitchen guaiFENesin (MUCINEX) 600 MG 12 hr tablet Take 600 mg by mouth 2 (two) times daily as needed for congestion.      Marland Kitchen ketorolac (ACULAR) 0.5 % ophthalmic solution Place 1 drop into the left eye 4 (four) times daily.      Marland Kitchen LANTUS 100 UNIT/ML injection Inject 25 Units into the skin at bedtime.      . metoprolol (LOPRESSOR) 50 MG tablet Take 1.5 tablets (75 mg total) by mouth 3 (three) times daily.  135 tablet  6  . nitroGLYCERIN (NITROSTAT) 0.4 MG SL tablet Place 1 tablet (0.4 mg total) under the tongue every 5 (five) minutes as needed for chest pain.  25 tablet  3  . torsemide (DEMADEX) 20 MG tablet Take 20-40 mg by mouth daily.       Marland Kitchen warfarin (COUMADIN) 2 MG tablet Managed by Dr. Nadara Mustard.  Take as directed by the coumadin clinic       No current facility-administered medications for this visit.    Past Medical History  Diagnosis Date  . Chronic atrial fibrillation     a. Failed prior DCCV x 2; b. chronic coumadin.  . Essential hypertension, benign   . Mixed hyperlipidemia   . Chronic systolic heart failure  a. EF 40-45% by echo 2012.  . GI bleed     December 2011 - subsequent discontinuation of Pradaxa  . Right bundle branch block   . Coronary atherosclerosis of native coronary artery     a. s/p MI 1998;  b. Chronically occluded LAD, patent DES diagonal, subtotally occluded anomalous circumflex - failed prior PCI;  b. 04/2013 Cath/PCI: LM 95d (3.0x16 Promus Premier DES), LAD 00, D1 patent stent, LCX small 100, RCA min irregs, R->L Collats.  . Bradycardia     a. s/p SJM nanostim leadless pacemaker implanted by Dr Rayann Heman  . Ischemic cardiomyopathy     LVEF 45%  . Pacemaker 01/18/2013    STJ Nanostim Leadless pacemaker implanted by Dr Rayann Heman  . History of pneumonia 1970's  . Type II diabetes mellitus   . Anemia   . History of blood transfusion 09/2010; 12/2012  . CVA (cerebral vascular accident) 09/2010    Suspected embolic while  off anticoagulation.  . Arthritis   . CKD (chronic kidney disease) stage 3, GFR 30-59 ml/min     Social History Mr. Scales reports that he quit smoking about 44 years ago. His smoking use included Cigarettes. He has a 12 pack-year smoking history. He has never used smokeless tobacco. Mr. Moris reports that he does not drink alcohol.  Review of Systems Other systems reviewed and negative except as outlined.  Physical Examination Filed Vitals:   05/05/14 1001  BP: 128/77  Pulse: 72   Filed Weights   05/05/14 1001  Weight: 209 lb (94.802 kg)    Appears comfortable.  HEENT: Conjunctiva and lids normal, oropharynx clear.  Neck: Supple, no elevated JVP or carotid bruits, no thyromegaly.  Lungs: Clear to auscultation, diminished, nonlabored breathing at rest.  Cardiac: Irregularly irregular, no S3, indistinct PMI.  Abdomen: Soft, nontender, bowel sounds present, no guarding or rebound.  Extremities: 1-2+ edema, distal pulses 2+.  Musculoskeletal: No kyphosis.  Skin: Warm and dry.  Neuropsychiatric: Alert and oriented x3, hard of hearing, affect appropriate.   Problem List and Plan   Chronic atrial fibrillation Recommendation is to continue Cardizem CD and Lopressor at prescribed doses. This has been effective in heart rate control and keeping him out of hospital. Mr. Debarr has had problems with compliance with his medications over the years. Also continues on Coumadin.  CAD, NATIVE VESSEL Status post DES to the left main in September 2014. He continues on Plavix.  Essential hypertension, benign Blood pressure is well-controlled today.  Second degree Mobitz II AV block And history of symptomatic bradycardia, status post leadless pacemaker, followed by Dr. Rayann Heman.    Satira Sark, M.D., F.A.C.C.

## 2014-05-05 NOTE — Assessment & Plan Note (Signed)
Status post DES to the left main in September 2014. He continues on Plavix.

## 2014-05-05 NOTE — Patient Instructions (Signed)
Your physician recommends that you schedule a follow-up appointment in: 4 months. You will receive a reminder letter in the mail in about 2 months reminding you to call and schedule your appointment. If you don't receive this letter, please contact our office. Your physician recommends that you continue on your current medications as directed. Please refer to the Current Medication list given to you today. 

## 2014-05-05 NOTE — Assessment & Plan Note (Signed)
And history of symptomatic bradycardia, status post leadless pacemaker, followed by Dr. Rayann Heman.

## 2014-05-05 NOTE — Assessment & Plan Note (Signed)
Blood pressure is well-controlled today. 

## 2014-05-05 NOTE — Assessment & Plan Note (Signed)
Recommendation is to continue Cardizem CD and Lopressor at prescribed doses. This has been effective in heart rate control and keeping him out of hospital. [name] [name] has had problems with compliance with his medications over the years. Also continues on Coumadin.

## 2014-07-16 ENCOUNTER — Ambulatory Visit (INDEPENDENT_AMBULATORY_CARE_PROVIDER_SITE_OTHER): Payer: Medicare Other | Admitting: *Deleted

## 2014-07-16 DIAGNOSIS — I482 Chronic atrial fibrillation, unspecified: Secondary | ICD-10-CM

## 2014-07-16 LAB — MDC_IDC_ENUM_SESS_TYPE_INCLINIC
Lead Channel Impedance Value: 480 Ohm
Lead Channel Pacing Threshold Amplitude: 0.25 V
Lead Channel Pacing Threshold Pulse Width: 0.4 ms
Lead Channel Sensing Intrinsic Amplitude: 10.5 mV
Lead Channel Setting Pacing Amplitude: 2.5 V
MDC IDC PG SERIAL: 4248
MDC IDC SET LEADCHNL RV PACING PULSEWIDTH: 0.4 ms
MDC IDC SET LEADCHNL RV SENSING SENSITIVITY: 2 mV
MDC IDC STAT BRADY RV PERCENT PACED: 24 %

## 2014-07-16 NOTE — Progress Notes (Signed)
Leadless pacemaker check in clinic ([employer] checked). Normal device function. Threshold, sensing, and impedance consistent with previous measurements. Device programmed to maximize longevity. No high ventricular rates noted. Device programmed at appropriate safety margins. Device programmed to optimize intrinsic conduction. Estimated longevity 10 years. Patient will follow up with JA in 6 months.

## 2014-07-18 ENCOUNTER — Encounter: Payer: Self-pay | Admitting: Cardiology

## 2014-07-23 ENCOUNTER — Telehealth: Payer: Self-pay | Admitting: Cardiology

## 2014-07-23 ENCOUNTER — Encounter (HOSPITAL_COMMUNITY): Payer: Self-pay | Admitting: Adult Health

## 2014-07-23 ENCOUNTER — Emergency Department (HOSPITAL_COMMUNITY): Payer: Medicare Other

## 2014-07-23 ENCOUNTER — Encounter: Payer: Self-pay | Admitting: Cardiology

## 2014-07-23 ENCOUNTER — Inpatient Hospital Stay (HOSPITAL_COMMUNITY)
Admission: EM | Admit: 2014-07-23 | Discharge: 2014-07-27 | DRG: 292 | Disposition: A | Discharge status: Home / Self Care | Payer: Medicare Other | Attending: Cardiology | Admitting: Cardiology

## 2014-07-23 ENCOUNTER — Ambulatory Visit (INDEPENDENT_AMBULATORY_CARE_PROVIDER_SITE_OTHER): Payer: Medicare Other | Admitting: Cardiology

## 2014-07-23 VITALS — BP 144/69 | HR 60 | Ht 69.0 in | Wt 209.0 lb

## 2014-07-23 DIAGNOSIS — I5043 Acute on chronic combined systolic (congestive) and diastolic (congestive) heart failure: Secondary | ICD-10-CM | POA: Diagnosis present

## 2014-07-23 DIAGNOSIS — E119 Type 2 diabetes mellitus without complications: Secondary | ICD-10-CM | POA: Diagnosis present

## 2014-07-23 DIAGNOSIS — Z880 Allergy status to penicillin: Secondary | ICD-10-CM

## 2014-07-23 DIAGNOSIS — Z888 Allergy status to other drugs, medicaments and biological substances status: Secondary | ICD-10-CM

## 2014-07-23 DIAGNOSIS — N179 Acute kidney failure, unspecified: Secondary | ICD-10-CM | POA: Diagnosis present

## 2014-07-23 DIAGNOSIS — Z950 Presence of cardiac pacemaker: Secondary | ICD-10-CM | POA: Diagnosis not present

## 2014-07-23 DIAGNOSIS — Z87891 Personal history of nicotine dependence: Secondary | ICD-10-CM

## 2014-07-23 DIAGNOSIS — N183 Chronic kidney disease, stage 3 unspecified: Secondary | ICD-10-CM | POA: Diagnosis present

## 2014-07-23 DIAGNOSIS — I441 Atrioventricular block, second degree: Secondary | ICD-10-CM

## 2014-07-23 DIAGNOSIS — I5042 Chronic combined systolic (congestive) and diastolic (congestive) heart failure: Secondary | ICD-10-CM

## 2014-07-23 DIAGNOSIS — R001 Bradycardia, unspecified: Secondary | ICD-10-CM | POA: Diagnosis not present

## 2014-07-23 DIAGNOSIS — I442 Atrioventricular block, complete: Secondary | ICD-10-CM | POA: Insufficient documentation

## 2014-07-23 DIAGNOSIS — R443 Hallucinations, unspecified: Secondary | ICD-10-CM | POA: Diagnosis present

## 2014-07-23 DIAGNOSIS — Z91041 Radiographic dye allergy status: Secondary | ICD-10-CM

## 2014-07-23 DIAGNOSIS — I129 Hypertensive chronic kidney disease with stage 1 through stage 4 chronic kidney disease, or unspecified chronic kidney disease: Secondary | ICD-10-CM | POA: Diagnosis present

## 2014-07-23 DIAGNOSIS — I2510 Atherosclerotic heart disease of native coronary artery without angina pectoris: Secondary | ICD-10-CM | POA: Diagnosis present

## 2014-07-23 DIAGNOSIS — R0602 Shortness of breath: Secondary | ICD-10-CM | POA: Diagnosis present

## 2014-07-23 DIAGNOSIS — Z7901 Long term (current) use of anticoagulants: Secondary | ICD-10-CM

## 2014-07-23 DIAGNOSIS — I482 Chronic atrial fibrillation, unspecified: Secondary | ICD-10-CM | POA: Diagnosis present

## 2014-07-23 DIAGNOSIS — Z8673 Personal history of transient ischemic attack (TIA), and cerebral infarction without residual deficits: Secondary | ICD-10-CM

## 2014-07-23 DIAGNOSIS — E785 Hyperlipidemia, unspecified: Secondary | ICD-10-CM | POA: Diagnosis present

## 2014-07-23 DIAGNOSIS — I255 Ischemic cardiomyopathy: Secondary | ICD-10-CM | POA: Diagnosis present

## 2014-07-23 DIAGNOSIS — M1990 Unspecified osteoarthritis, unspecified site: Secondary | ICD-10-CM | POA: Diagnosis present

## 2014-07-23 LAB — BASIC METABOLIC PANEL
ANION GAP: 15 (ref 5–15)
BUN: 45 mg/dL — ABNORMAL HIGH (ref 6–23)
CALCIUM: 9.7 mg/dL (ref 8.4–10.5)
CO2: 25 mEq/L (ref 19–32)
Chloride: 100 mEq/L (ref 96–112)
Creatinine, Ser: 1.6 mg/dL — ABNORMAL HIGH (ref 0.50–1.35)
GFR calc Af Amer: 44 mL/min — ABNORMAL LOW (ref >90)
GFR, EST NON AFRICAN AMERICAN: 38 mL/min — AB (ref >90)
GLUCOSE: 182 mg/dL — AB (ref 70–99)
POTASSIUM: 4.5 meq/L (ref 3.7–5.3)
SODIUM: 140 meq/L (ref 137–147)

## 2014-07-23 LAB — PRO B NATRIURETIC PEPTIDE: PRO B NATRI PEPTIDE: 6451 pg/mL — AB (ref 0–450)

## 2014-07-23 LAB — URINALYSIS, ROUTINE W REFLEX MICROSCOPIC
BILIRUBIN URINE: NEGATIVE
Glucose, UA: NEGATIVE mg/dL
HGB URINE DIPSTICK: NEGATIVE
KETONES UR: NEGATIVE mg/dL
Leukocytes, UA: NEGATIVE
NITRITE: NEGATIVE
Protein, ur: 100 mg/dL — AB
SPECIFIC GRAVITY, URINE: 1.012 (ref 1.005–1.030)
Urobilinogen, UA: 1 mg/dL (ref 0.0–1.0)
pH: 5 (ref 5.0–8.0)

## 2014-07-23 LAB — TROPONIN I: Troponin I: <0.3 ng/mL (ref <0.30)

## 2014-07-23 LAB — CBC
HCT: 35.1 % — ABNORMAL LOW (ref 39.0–52.0)
Hemoglobin: 11.3 g/dL — ABNORMAL LOW (ref 13.0–17.0)
MCH: 30.8 pg (ref 26.0–34.0)
MCHC: 32.2 g/dL (ref 30.0–36.0)
MCV: 95.6 fL (ref 78.0–100.0)
PLATELETS: 177 10*3/uL (ref 150–400)
RBC: 3.67 MIL/uL — AB (ref 4.22–5.81)
RDW: 14.1 % (ref 11.5–15.5)
WBC: 10.3 10*3/uL (ref 4.0–10.5)

## 2014-07-23 LAB — PROTIME-INR
INR: 2.82 — AB (ref 0.00–1.49)
Prothrombin Time: 29.9 seconds — ABNORMAL HIGH (ref 11.6–15.2)

## 2014-07-23 LAB — URINE MICROSCOPIC-ADD ON

## 2014-07-23 LAB — I-STAT TROPONIN, ED: TROPONIN I, POC: 0.01 ng/mL (ref 0.00–0.08)

## 2014-07-23 MED ORDER — DILTIAZEM HCL ER COATED BEADS 180 MG PO CP24
360.0000 mg | ORAL_CAPSULE | Freq: Every day | ORAL | Status: DC
  Administered 2014-07-24 – 2014-07-25 (×2): 360 mg via ORAL
  Filled 2014-07-23 (×2): qty 1
  Filled 2014-07-23: qty 2

## 2014-07-23 MED ORDER — WARFARIN SODIUM 3 MG PO TABS: 3.0000 mg | ORAL_TABLET | ORAL | Status: DC

## 2014-07-23 MED ORDER — VITAMIN C 500 MG PO TABS
1000.0000 mg | ORAL_TABLET | Freq: Every day | ORAL | Status: DC
  Administered 2014-07-24 – 2014-07-27 (×4): 1000 mg via ORAL
  Filled 2014-07-23 (×5): qty 2

## 2014-07-23 MED ORDER — FUROSEMIDE 10 MG/ML IJ SOLN
40.0000 mg | Freq: Once | INTRAMUSCULAR | Status: AC
  Administered 2014-07-23: 40 mg via INTRAVENOUS
  Filled 2014-07-23: qty 4

## 2014-07-23 MED ORDER — GUAIFENESIN ER 600 MG PO TB12
600.0000 mg | ORAL_TABLET | Freq: Two times a day (BID) | ORAL | Status: DC | PRN
  Filled 2014-07-23: qty 1

## 2014-07-23 MED ORDER — TORSEMIDE 20 MG PO TABS
40.0000 mg | ORAL_TABLET | Freq: Every day | ORAL | Status: DC
  Administered 2014-07-24: 40 mg via ORAL
  Filled 2014-07-23 (×2): qty 2

## 2014-07-23 MED ORDER — NITROGLYCERIN 0.4 MG SL SUBL: 0.4000 mg | SUBLINGUAL_TABLET | SUBLINGUAL | Status: DC | PRN

## 2014-07-23 MED ORDER — ONDANSETRON HCL 4 MG/2ML IJ SOLN: 4.0000 mg | Freq: Four times a day (QID) | INTRAMUSCULAR | Status: DC | PRN

## 2014-07-23 MED ORDER — METOPROLOL TARTRATE 50 MG PO TABS
75.0000 mg | ORAL_TABLET | Freq: Three times a day (TID) | ORAL | Status: DC
  Administered 2014-07-23 – 2014-07-25 (×7): 75 mg via ORAL
  Filled 2014-07-23 (×10): qty 1
  Filled 2014-07-23: qty 3
  Filled 2014-07-23: qty 1

## 2014-07-23 MED ORDER — ATORVASTATIN CALCIUM 10 MG PO TABS
10.0000 mg | ORAL_TABLET | Freq: Every day | ORAL | Status: DC
  Administered 2014-07-24 – 2014-07-27 (×4): 10 mg via ORAL
  Filled 2014-07-23 (×5): qty 1

## 2014-07-23 MED ORDER — WARFARIN SODIUM 2 MG PO TABS
2.0000 mg | ORAL_TABLET | ORAL | Status: AC
  Administered 2014-07-24: 2 mg via ORAL
  Filled 2014-07-23 (×3): qty 1

## 2014-07-23 MED ORDER — CLOPIDOGREL BISULFATE 75 MG PO TABS
75.0000 mg | ORAL_TABLET | Freq: Once | ORAL | Status: AC
  Administered 2014-07-24: 75 mg via ORAL
  Filled 2014-07-23: qty 1

## 2014-07-23 MED ORDER — IPRATROPIUM-ALBUTEROL 0.5-2.5 (3) MG/3ML IN SOLN: 3.0000 mL | RESPIRATORY_TRACT | Status: DC | PRN

## 2014-07-23 MED ORDER — INSULIN GLARGINE 100 UNIT/ML ~~LOC~~ SOLN
30.0000 [IU] | Freq: Every day | SUBCUTANEOUS | Status: DC
  Administered 2014-07-23 – 2014-07-26 (×4): 30 [IU] via SUBCUTANEOUS
  Filled 2014-07-23 (×5): qty 0.3

## 2014-07-23 MED ORDER — WARFARIN SODIUM 2 MG PO TABS
2.0000 mg | ORAL_TABLET | Freq: Once | ORAL | Status: AC
  Administered 2014-07-23: 2 mg via ORAL
  Filled 2014-07-23: qty 1

## 2014-07-23 MED ORDER — ACETAMINOPHEN 325 MG PO TABS: 650.0000 mg | ORAL_TABLET | ORAL | Status: DC | PRN

## 2014-07-23 MED ORDER — WARFARIN - PHARMACIST DOSING INPATIENT
Freq: Every day | Status: DC
  Administered 2014-07-26: 18:00:00

## 2014-07-23 NOTE — Progress Notes (Signed)
Reason for visit: Hospital follow-up, progressive shortness of breath  Clinical Summary [name] [name] is a medically complex 80 y.o.male last seen in September. He just had a device check earlier this month with normal function, no high ventricular rates noted. Record review finds hospitalization at [facility] within the last week secondary to progressive shortness of breath. Chest x-ray showed evidence of congestive heart failure with interstitial and early alveolar edema as well as small pleural effusions. He was admitted briefly for IV diuresis and reportedly improved symptomatically. He was sent home with nebulizer treatments, not entirely clear whether this has been helpful or not.  He comes in today with his wife and 2 daughters. Family members are very concerned about his decline, he has been persistently short of breath with activities such as simply walking across the room. He denies any chest pain or palpitations. His weight is stable actually, although he does have leg edema and decreased breath sounds at the bases. He reports compliance with his medications, although noncompliance has been a recurring problem for him.  Patient just recently saw Dr. Nadara Mustard yesterday for a post hospital follow-up. New medication added was Ativan to help him rest. Patient's wife also states that he has episodes of agitation and "not talking right."  Recent lab work showed BUN 38, creatinine 1.5, potassium 4.1, troponin I 0.01, INR 2.5, BNP as high as 687, hemoglobin 12.0, platelets 155.  Today we reviewed his history, including records from [place]. He did not undergo any follow-up cardiac structural or ischemic evaluation. I have recommended that he be hospitalized for further assessment in light of his progressive symptoms, rather than trying to do this as an outpatient. He and his family are in agreement with this, and he will be going to the [facility] ER for initial assessment, and evaluation by our  cardiology team for admission.   Allergies  Allergen Reactions  . Colcrys [Colchicine]     Skin irritation.   . Contrast Media [Iodinated Diagnostic Agents] Other (See Comments)    Affected kidneys (yellow dye, pigment blue)  . Dabigatran Other (See Comments)    REACTION: Bleeding-low HGB  . Penicillins Swelling    Face swelling    Current Outpatient Prescriptions  Medication Sig Dispense Refill  . Ascorbic Acid (VITAMIN C) 1000 MG tablet Take 1,000 mg by mouth daily.    Marland Kitchen atorvastatin (LIPITOR) 10 MG tablet TAKE ONE TABLET BY MOUTH EVERY DAY 30 tablet 6  . clopidogrel (PLAVIX) 75 MG tablet TAKE ONE TABLET BY MOUTH EVERY DAY WITH BREAKFAST 30 tablet 6  . diltiazem (CARDIZEM CD) 360 MG 24 hr capsule Take 1 capsule (360 mg total) by mouth daily. 90 capsule 3  . guaiFENesin (MUCINEX) 600 MG 12 hr tablet Take 600 mg by mouth 2 (two) times daily as needed for congestion.    Marland Kitchen ipratropium-albuterol (DUONEB) 0.5-2.5 (3) MG/3ML SOLN Take 3 mLs by nebulization every 4 (four) hours as needed.    Marland Kitchen LANTUS 100 UNIT/ML injection Inject 30 Units into the skin at bedtime.     Marland Kitchen LORazepam (ATIVAN) 1 MG tablet Take 1 mg by mouth 2 (two) times daily.    . metoprolol (LOPRESSOR) 50 MG tablet Take 1.5 tablets (75 mg total) by mouth 3 (three) times daily. 135 tablet 6  . nitroGLYCERIN (NITROSTAT) 0.4 MG SL tablet Place 1 tablet (0.4 mg total) under the tongue every 5 (five) minutes as needed for chest pain. 25 tablet 3  . torsemide (DEMADEX) 20 MG tablet  Take 20-40 mg by mouth daily.     Marland Kitchen warfarin (COUMADIN) 2 MG tablet Managed by Dr. Nadara Mustard.  Take as directed by the coumadin clinic     No current facility-administered medications for this visit.    Past Medical History  Diagnosis Date  . Chronic atrial fibrillation     a. Failed prior DCCV x 2; b. chronic coumadin.  . Essential hypertension, benign   . Mixed hyperlipidemia   . Chronic systolic heart failure     a. EF 40-45% by echo 2012.  . GI  bleed     December 2011 - subsequent discontinuation of Pradaxa  . Right bundle branch block   . Coronary atherosclerosis of native coronary artery     a. s/p MI 1998;  b. Chronically occluded LAD, patent DES diagonal, subtotally occluded anomalous circumflex - failed prior PCI;  b. 04/2013 Cath/PCI: LM 95d (3.0x16 Promus Premier DES), LAD 00, D1 patent stent, LCX small 100, RCA min irregs, R->L Collats.  . Bradycardia     a. s/p SJM nanostim leadless pacemaker implanted by Dr Rayann Heman  . Ischemic cardiomyopathy     LVEF 45%  . Pacemaker 01/18/2013    STJ Nanostim Leadless pacemaker implanted by Dr Rayann Heman  . History of pneumonia 1970's  . Type II diabetes mellitus   . Anemia   . History of blood transfusion 09/2010; 12/2012  . CVA (cerebral vascular accident) 09/2010    Suspected embolic while off anticoagulation.  . Arthritis   . CKD (chronic kidney disease) stage 3, GFR 30-59 ml/min     Past Surgical History  Procedure Laterality Date  . Pacemaker insertion  01/18/2013    STJ Nanostim Leadless pacemaker implanted by Dr Rayann Heman  . Lumbar disc surgery  2000's  . Cystectomy  1951    Family History  Problem Relation Age of Onset  . Diabetes Other   . Hypertension Other     Social History [name] [name] reports that he quit smoking about 44 years ago. His smoking use included Cigarettes. He has a 12 pack-year smoking history. He has never used smokeless tobacco. [name] [name] reports that he does not drink alcohol.  Review of Systems Complete review of systems negative except as otherwise outlined in the clinical summary and also the following. Hard of hearing. No active chest pain. Recent problems with orthopnea and leg edema. No reported bleeding episodes.  Physical Examination Filed Vitals:   07/23/14 1418  BP: 144/69  Pulse: 60   Filed Weights   07/23/14 1418  Weight: 209 lb (94.802 kg)    Overweight male, intermittently short of breath with Cheyne-Stokes respirations. HEENT:  Conjunctiva and lids normal, oropharynx clear.  Neck: Supple, elevated JVP, no carotid bruits, no thyromegaly.  Lungs: Decreased at the bases with scattered rhonchi, diminished. Cardiac Dstant, irregularly irregular, no S3, indistinct PMI.  Abdomen: Protuberant, nontender, bowel sounds present, no guarding or rebound.  Extremities: 2+ edema, distal pulses 1-2+.  Musculoskeletal: No kyphosis.  Skin: Warm and dry.  Neuropsychiatric: Alert and oriented x3, hard of hearing, affect appropriate.   Problem List and Plan   Shortness of breath Progressive over a period of weeks, no associated chest pain, intermittent Cheyne-Stokes respirations. Acute on chronic combined heart failure certainly to be considered, but patient has complex cardiac history as noted above. Leadless pacemaker evaluation done recently showed normal function with no high ventricular rates. It would seem that his atrial fibrillation has not been out of control recently. His weight is also  stable by our scales, however he did have evidence of small pleural effusions and pulmonary edema by chest x-ray at [facility]. Cardiac markers negative at that time. He has not improved with IV diuresis and nebulizer treatments arranged after his stay at [facility]. I have recommended rehospitalization for further evaluation. He will be going to the ER at [facility] for initial assessment and then evaluation for admission by our cardiology service. Needs to repeat cardiac enzymes to exclude ACS, also follow-up echocardiogram should be obtained. Depending on these results and his clinical course, ischemic evaluation can then be better determined, he may in fact need a right and left heart catheterization.  CAD, NATIVE VESSEL Known multivessel disease status post interventions over time, most recently with DES to the left main in September 2014 at which time diagonal stent was found to be patent, circumflex was occluded, and RCA showed minor  irregularities with associated right to left collaterals. He has been on long-term Plavix along with Coumadin (atrial fibrillation).  Chronic combined systolic and diastolic heart failure LVEF up proximally 45% by last assessment. Follow-up echocardiogram needs to be obtained.  Chronic atrial fibrillation On Coumadin and rate control medications, no high ventricular rates by recent device interrogation.  Second degree Mobitz II AV block Status post St. Jude leadless pacemaker per Dr. Rayann Heman.    Satira Sark, M.D., F.A.C.C.

## 2014-07-23 NOTE — H&P (Signed)
CARDIOLOGY HISTORY AND PHYSICAL   Patient ID: [name] [name] MRN: [id number]  DOB/AGE: [id number] 78 y.o. Admit date: 07/23/2014  Primary Care Physician: Rory Percy, MD Primary Cardiologist:   Manus Gunning  Clinical Summary [name] [name] is a 80 y.o.male. He was recently hospitalized at [facility]. There he was treated for both pulmonary issues and CHF. He was discharged home. He has not recovered to his baseline. His family says that he has continued shortness of breath. He also has had some disorientation and possibly some hallucinations. He was seen at length by Dr. Domenic Polite in the office today. His complete office note is listed in this record just prior to this note. The patient has a lead to this pacemaker. It was interrogated within the past week. By report it was working well. He has an ejection fraction of 45% by history. There is chronic atrial fibrillation. He has known coronary disease with an intervention in 2014. Because of this he is on Coumadin and Plavix.  While in the emergency room this evening, the family insists that they saw an area flat line on his monitor. He sat up rapidly. There was no nursing staff in the room. We do not document any pacemaker failure on the monitor.   Allergies  Allergen Reactions  . Colcrys [Colchicine]     Skin irritation.   . Contrast Media [Iodinated Diagnostic Agents] Other (See Comments)    Affected kidneys (yellow dye, pigment blue)  . Dabigatran Other (See Comments)    REACTION: Bleeding-low HGB  . Penicillins Swelling    Face swelling    Home Medications  (Not in a hospital admission)  Scheduled Medications    Infusions    PRN Medications       Past Medical History  Diagnosis Date  . Chronic atrial fibrillation     a. Failed prior DCCV x 2; b. chronic coumadin.  . Essential hypertension, benign   . Mixed hyperlipidemia   . Chronic systolic heart failure     a. EF 40-45% by echo 2012.  .  GI bleed     December 2011 - subsequent discontinuation of Pradaxa  . Right bundle branch block   . Coronary atherosclerosis of native coronary artery     a. s/p MI 1998;  b. Chronically occluded LAD, patent DES diagonal, subtotally occluded anomalous circumflex - failed prior PCI;  b. 04/2013 Cath/PCI: LM 95d (3.0x16 Promus Premier DES), LAD 00, D1 patent stent, LCX small 100, RCA min irregs, R->L Collats.  . Bradycardia     a. s/p SJM nanostim leadless pacemaker implanted by Dr Rayann Heman  . Ischemic cardiomyopathy     LVEF 45%  . Pacemaker 01/18/2013    STJ Nanostim Leadless pacemaker implanted by Dr Rayann Heman  . History of pneumonia 1970's  . Type II diabetes mellitus   . Anemia   . History of blood transfusion 09/2010; 12/2012  . CVA (cerebral vascular accident) 09/2010    Suspected embolic while off anticoagulation.  . Arthritis   . CKD (chronic kidney disease) stage 3, GFR 30-59 ml/min     Past Surgical History  Procedure Laterality Date  . Pacemaker insertion  01/18/2013    STJ Nanostim Leadless pacemaker implanted by Dr Rayann Heman  . Lumbar disc surgery  2000's  . Cystectomy  1951    Family History  Problem Relation Age of Onset  . Diabetes Other   . Hypertension Other     Social History [name] [name] reports that  he quit smoking about 44 years ago. His smoking use included Cigarettes. He has a 12 pack-year smoking history. He has never used smokeless tobacco. [name] [name] reports that he does not drink alcohol.  Review of Systems Patient denies fever, chills, headache, sweats, rash, change in vision, change in hearing, GI symptoms or urinary symptoms. All other systems are reviewed and are negative other than the history of present illness.  Physical Examination Temp:  [97.6 F (36.4 C)] 97.6 F (36.4 C) (12/09 1703) Pulse Rate:  [50-60] 55 (12/09 1845) Resp:  [18-28] 28 (12/09 1845) BP: (131-153)/(55-69) 144/63 mmHg (12/09 1845) SpO2:  [92 %-99 %] 98 % (12/09 1845) Weight:  [209  lb (94.802 kg)] 209 lb (94.802 kg) (12/09 1418) No intake or output data in the 24 hours ending 07/23/14 1915  The patient is talkative. He has 6 family members in the room. Head is atraumatic. Sclera and conjunctiva are normal. He is lying relatively flat in bed. There is no jugular venous distention. Lungs reveal scattered rhonchi. Cardiac exam reveals S1 and S2. Abdomen is soft. He has 1+ peripheral edema. There are no musculoskeletal deformities. There are no skin rashes.   Lab Results  Basic Metabolic Panel:  Recent Labs Lab 07/23/14 1706  NA 140  K 4.5  CL 100  CO2 25  GLUCOSE 182*  BUN 45*  CREATININE 1.60*  CALCIUM 9.7    Liver Function Tests: No results for input(s): AST, ALT, ALKPHOS, BILITOT, PROT, ALBUMIN in the last 168 hours.  CBC:  Recent Labs Lab 07/23/14 1706  WBC 10.3  HGB 11.3*  HCT 35.1*  MCV 95.6  PLT 177    Cardiac Enzymes: No results for input(s): CKTOTAL, CKMB, CKMBINDEX, TROPONINI in the last 168 hours.  BNP: Invalid input(s): POCBNP   Radiology No results found.  Chest XRay  Shows mild CHF.  Prior Cardiac Testing/Procedures:   While following him in the room, he was pacing.   Impression and Recommendations    CAD, NATIVE VESSEL     By history his most recent coronary intervention was a PCI to the left main in 2014. He remains on Plavix along with Coumadin for his atrial fibrillation. He has not been having any significant chest pain.    Chronic atrial fibrillation     The patient has chronic underlying atrial fibrillation. Coumadin will be continued.    CKD (chronic kidney disease), stage III     His BUN of 40 and creatinine of 1.6 is slightly higher than his baseline.    Shortness of breath     By history he was treated with nebulizers while he was at [facility] recently. At this point it is difficult to be sure what the basis of his shortness of breath is. His chest x-ray is a little wet. I will give him one dose of  IV diuretics tonight. We could look at his input and output and renal function tomorrow and make further decisions. He will need further pulmonary assessment.    Pacemaker     Patient has a leadless pacemaker. By history it was interrogated proximally one week ago. By history there was no tachycardia and the pacemaker was working well. So far we have not documented any definite abnormalities. The family feels that there was a period of bradycardia in the emergency room today. We cannot document this yet.    Hallucinations     The patient's family says that he has been talking out of his head intermittently.  He did receive some lorazepam from his primary physician to help with anxiety. The family thinks that his mental status may have changed before this medication. We will have to monitor his overall mental status going forward in the hospital.    Acute on chronic combined systolic and diastolic CHF (congestive heart failure)     The patient does have peripheral edema. There is mild CHF on chest x-ray. I have ordered a dose of IV Lasix for tonight. We will order a follow-up 2-D echo to reassess his LV function.    Warfarin anticoagulation     Warfarin anticoagulation will be continued.  Overall the patient had failure to thrive after recent discharge from [facility]. We need to check his pacemaker function and his LV function. We need to stabilize his volume status and his overall pulmonary status. We then need to make sure that his mental status is completely stable. He should not be discharged home until he is fully back to his baseline unless he has need for nursing home. His family says that he is up and around and active prior to his recent illness.  Daryel November, MD 07/23/2014, 7:15 PM

## 2014-07-23 NOTE — Assessment & Plan Note (Signed)
On Coumadin and rate control medications, no high ventricular rates by recent device interrogation.

## 2014-07-23 NOTE — ED Notes (Signed)
Presents with one week of SOB, weakness, cough, "crackles around my heart" generalized malaise. +2 pitting edema-Dr. Allred sent him over to be evaluated. Pt dfenies production. Denies fever. Breath sounds diminished. USing nebulizers at home for 2 days, with some improvement of SOB.

## 2014-07-23 NOTE — Assessment & Plan Note (Signed)
LVEF up proximally 45% by last assessment. Follow-up echocardiogram needs to be obtained.

## 2014-07-23 NOTE — Assessment & Plan Note (Signed)
Known multivessel disease status post interventions over time, most recently with DES to the left main in September 2014 at which time diagonal stent was found to be patent, circumflex was occluded, and RCA showed minor irregularities with associated right to left collaterals. He has been on long-term Plavix along with Coumadin (atrial fibrillation).

## 2014-07-23 NOTE — Assessment & Plan Note (Signed)
Progressive over a period of weeks, no associated chest pain, intermittent Cheyne-Stokes respirations. Acute on chronic combined heart failure certainly to be considered, but patient has complex cardiac history as noted above. Leadless pacemaker evaluation done recently showed normal function with no high ventricular rates. It would seem that his atrial fibrillation has not been out of control recently. His weight is also stable by our scales, however he did have evidence of small pleural effusions and pulmonary edema by chest x-ray at [facility]. Cardiac markers negative at that time. He has not improved with IV diuresis and nebulizer treatments arranged after his stay at [facility]. I have recommended rehospitalization for further evaluation. He will be going to the ER at [facility] for initial assessment and then evaluation for admission by our cardiology service. Needs to repeat cardiac enzymes to exclude ACS, also follow-up echocardiogram should be obtained. Depending on these results and his clinical course, ischemic evaluation can then be better determined, he may in fact need a right and left heart catheterization.

## 2014-07-23 NOTE — ED Provider Notes (Signed)
CSN: [id number]     Arrival date & time 07/23/14  1634 History   First MD Initiated Contact with Patient 07/23/14 1722     Chief Complaint  Patient presents with  . Shortness of Breath  . Weakness     (Consider location/radiation/quality/duration/timing/severity/associated sxs/prior Treatment) Patient is a 80 y.o. male presenting with shortness of breath and weakness. The history is provided by the patient and a relative.  Shortness of Breath Severity:  Moderate Associated symptoms: cough   Associated symptoms: no abdominal pain, no chest pain, no headaches, no rash and no vomiting   Weakness Associated symptoms include shortness of breath. Pertinent negatives include no chest pain, no abdominal pain and no headaches.   patient was sent from his cardiologist's office. Some shortness of breath over the last few weeks. Worse for the last 5 days. Recently seen at [facility] for the same. Patient's has a pacemaker and it was reportedly interrogated and no episodes of tachycardia. Also was reported had some hallucinations. Seeing things that are not there. No fevers. He has a cough with some sputum production. No chest pain. He has swelling in his legs that is not as bad as it has been in the past. Sent in for admission cardiology.  Past Medical History  Diagnosis Date  . Chronic atrial fibrillation     a. Failed prior DCCV x 2; b. chronic coumadin.  . Essential hypertension, benign   . Mixed hyperlipidemia   . Chronic systolic heart failure     a. EF 40-45% by echo 2012.  . GI bleed     December 2011 - subsequent discontinuation of Pradaxa  . Right bundle branch block   . Coronary atherosclerosis of native coronary artery     a. s/p MI 1998;  b. Chronically occluded LAD, patent DES diagonal, subtotally occluded anomalous circumflex - failed prior PCI;  b. 04/2013 Cath/PCI: LM 95d (3.0x16 Promus Premier DES), LAD 00, D1 patent stent, LCX small 100, RCA min irregs, R->L Collats.  .  Bradycardia     a. s/p SJM nanostim leadless pacemaker implanted by Dr Rayann Heman  . Ischemic cardiomyopathy     LVEF 45%  . Pacemaker 01/18/2013    STJ Nanostim Leadless pacemaker implanted by Dr Rayann Heman  . History of pneumonia 1970's  . Type II diabetes mellitus   . Anemia   . History of blood transfusion 09/2010; 12/2012  . CVA (cerebral vascular accident) 09/2010    Suspected embolic while off anticoagulation.  . Arthritis   . CKD (chronic kidney disease) stage 3, GFR 30-59 ml/min    Past Surgical History  Procedure Laterality Date  . Pacemaker insertion  01/18/2013    STJ Nanostim Leadless pacemaker implanted by Dr Rayann Heman  . Lumbar disc surgery  2000's  . Cystectomy  1951  . Permanent pacemaker insertion N/A 01/18/2013    Procedure: PERMANENT PACEMAKER INSERTION;  Surgeon: Thompson Grayer, MD;  Location: [facility] CATH LAB;  Service: Cardiovascular;  Laterality: N/A;  . Left heart catheterization with coronary angiogram N/A 05/03/2013    Procedure: LEFT HEART CATHETERIZATION WITH CORONARY ANGIOGRAM;  Surgeon: Peter M Martinique, MD;  Location: [facility] CATH LAB;  Service: Cardiovascular;  Laterality: N/A;   Family History  Problem Relation Age of Onset  . Diabetes Other   . Hypertension Other    History  Substance Use Topics  . Smoking status: Former Smoker -- 0.30 packs/day for 40 years    Types: Cigarettes    Quit date: 08/15/1969  . Smokeless  tobacco: Never Used  . Alcohol Use: No    Review of Systems  Constitutional: Positive for fatigue. Negative for activity change and appetite change.  Eyes: Negative for pain.  Respiratory: Positive for cough and shortness of breath. Negative for chest tightness.   Cardiovascular: Positive for leg swelling. Negative for chest pain.  Gastrointestinal: Negative for nausea, vomiting, abdominal pain and diarrhea.  Genitourinary: Negative for flank pain.  Musculoskeletal: Negative for back pain and neck stiffness.  Skin: Negative for rash.  Neurological:  Positive for weakness. Negative for numbness and headaches.  Psychiatric/Behavioral: Positive for hallucinations. Negative for behavioral problems.      Allergies  Colcrys; Contrast media; Dabigatran; and Penicillins  Home Medications   Prior to Admission medications   Medication Sig Start Date End Date Taking? Authorizing Provider  Ascorbic Acid (VITAMIN C) 1000 MG tablet Take 1,000 mg by mouth daily.   Yes Historical Provider, MD  atorvastatin (LIPITOR) 10 MG tablet TAKE ONE TABLET BY MOUTH EVERY DAY 11/04/13  Yes Satira Sark, MD  clopidogrel (PLAVIX) 75 MG tablet TAKE ONE TABLET BY MOUTH EVERY DAY WITH BREAKFAST 12/09/13  Yes Satira Sark, MD  diltiazem (CARDIZEM CD) 360 MG 24 hr capsule Take 1 capsule (360 mg total) by mouth daily. 01/17/14  Yes Coralyn Mark, MD  guaiFENesin (MUCINEX) 600 MG 12 hr tablet Take 600 mg by mouth 2 (two) times daily as needed for congestion.   Yes Historical Provider, MD  ipratropium-albuterol (DUONEB) 0.5-2.5 (3) MG/3ML SOLN Take 3 mLs by nebulization every 4 (four) hours as needed.   Yes Historical Provider, MD  LANTUS 100 UNIT/ML injection Inject 30 Units into the skin at bedtime.  10/06/10  Yes Historical Provider, MD  LORazepam (ATIVAN) 1 MG tablet Take 0.5 mg by mouth daily as needed for anxiety.    Yes Historical Provider, MD  metoprolol (LOPRESSOR) 50 MG tablet Take 1.5 tablets (75 mg total) by mouth 3 (three) times daily. 03/10/14  Yes Satira Sark, MD  torsemide (DEMADEX) 20 MG tablet Take 20-40 mg by mouth daily.    Yes Historical Provider, MD  warfarin (COUMADIN) 2 MG tablet Take 2-3 mg by mouth See admin instructions. Managed by Dr. Nadara Mustard.  Take as directed by the coumadin clinic INR is 3.5 as of 07-22-14. Take 2mg  on Sunday, Mon, Wed, [place], and Sat. 3mg  on Tue and Friday   Yes Historical Provider, MD  nitroGLYCERIN (NITROSTAT) 0.4 MG SL tablet Place 1 tablet (0.4 mg total) under the tongue every 5 (five) minutes as needed for chest  pain. 05/04/13   Rogelia Mire, NP   BP 111/56 mmHg  Pulse 62  Temp(Src) 98.1 F (36.7 C) (Oral)  Resp 19  Ht 5\' 8"  (1.727 m)  Wt 194 lb 11.2 oz (88.315 kg)  BMI 29.61 kg/m2  SpO2 97% Physical Exam  Constitutional: He is oriented to person, place, and time. He appears well-developed.  HENT:  Head: Normocephalic.  Eyes: Pupils are equal, round, and reactive to light.  Cardiovascular: Normal rate.   Pulmonary/Chest: He has wheezes.  Few scattered wheezes. Bilateral lower extremity pitting edema  Abdominal: Soft.  Musculoskeletal: He exhibits edema.  Neurological: He is alert and oriented to person, place, and time.  Skin: Skin is warm.    ED Course  Procedures (including critical care time) Labs Review Labs Reviewed  CBC - Abnormal; Notable for the following:    RBC 3.67 (*)    Hemoglobin 11.3 (*)    HCT  35.1 (*)    All other components within normal limits  BASIC METABOLIC PANEL - Abnormal; Notable for the following:    Glucose, Bld 182 (*)    BUN 45 (*)    Creatinine, Ser 1.60 (*)    GFR calc non Af Amer 38 (*)    GFR calc Af Amer 44 (*)    All other components within normal limits  PRO B NATRIURETIC PEPTIDE - Abnormal; Notable for the following:    Pro B Natriuretic peptide (BNP) 6451.0 (*)    All other components within normal limits  PROTIME-INR - Abnormal; Notable for the following:    Prothrombin Time 29.9 (*)    INR 2.82 (*)    All other components within normal limits  URINALYSIS, ROUTINE W REFLEX MICROSCOPIC - Abnormal; Notable for the following:    Protein, ur 100 (*)    All other components within normal limits  PROTIME-INR - Abnormal; Notable for the following:    Prothrombin Time 28.3 (*)    INR 2.63 (*)    All other components within normal limits  URINE MICROSCOPIC-ADD ON - Abnormal; Notable for the following:    Casts HYALINE CASTS (*)    All other components within normal limits  BASIC METABOLIC PANEL - Abnormal; Notable for the  following:    BUN 44 (*)    Creatinine, Ser 1.58 (*)    GFR calc non Af Amer 39 (*)    GFR calc Af Amer 45 (*)    All other components within normal limits  LIPID PANEL - Abnormal; Notable for the following:    HDL 26 (*)    All other components within normal limits  PROTIME-INR - Abnormal; Notable for the following:    Prothrombin Time 26.1 (*)    INR 2.37 (*)    All other components within normal limits  GLUCOSE, CAPILLARY - Abnormal; Notable for the following:    Glucose-Capillary 166 (*)    All other components within normal limits  GLUCOSE, CAPILLARY - Abnormal; Notable for the following:    Glucose-Capillary 218 (*)    All other components within normal limits  GLUCOSE, CAPILLARY - Abnormal; Notable for the following:    Glucose-Capillary 69 (*)    All other components within normal limits  GLUCOSE, CAPILLARY - Abnormal; Notable for the following:    Glucose-Capillary 120 (*)    All other components within normal limits  GLUCOSE, CAPILLARY - Abnormal; Notable for the following:    Glucose-Capillary 152 (*)    All other components within normal limits  GLUCOSE, CAPILLARY - Abnormal; Notable for the following:    Glucose-Capillary 211 (*)    All other components within normal limits  GLUCOSE, CAPILLARY - Abnormal; Notable for the following:    Glucose-Capillary 139 (*)    All other components within normal limits  GLUCOSE, CAPILLARY - Abnormal; Notable for the following:    Glucose-Capillary 62 (*)    All other components within normal limits  GLUCOSE, CAPILLARY - Abnormal; Notable for the following:    Glucose-Capillary 157 (*)    All other components within normal limits  MRSA PCR SCREENING  TROPONIN I  TROPONIN I  TROPONIN I  I-STAT TROPOININ, ED    Imaging Review No results found.   EKG Interpretation   Date/Time:  Wednesday July 23 2014 16:54:59 EST Ventricular Rate:  55 PR Interval:    QRS Duration: 200 QT Interval:  534 QTC Calculation: 510 R  Axis:   -56 Text  Interpretation:  Ventricular-paced rhythm Atrial fibrillation  Confirmed by Alvino Chapel  MD, Ovid Curd [phone]) on 07/23/2014 6:06:10 PM      MDM   Final diagnoses:  Acute on chronic combined systolic and diastolic CHF (congestive heart failure)  Warfarin anticoagulation  CKD (chronic kidney disease), stage III     patient with CHF. Is on Coumadin. Sent from cardiology for admission. Had possible episode of bradycardia, however only witnessed by family members. During the time when the monitor said asystole he had an apparent pulse at his pacer minimum. Will admit to cardiology.    Jasper Riling. Alvino Chapel, MD 07/26/14 0254

## 2014-07-23 NOTE — Telephone Encounter (Signed)
dsch MMh 12/5. Still having SOB

## 2014-07-23 NOTE — Telephone Encounter (Signed)
Spoke with daughter and she confirmed that patient was still having sob since d/c from [facility] on 07/20/14. Daughter said patient was taking his medications as prescribed on d/c. Appointment given for 2:20 pm today.

## 2014-07-23 NOTE — ED Notes (Signed)
Pt dropped heart rate to low 30's then came back to low 50's.  Pt placed on 02 at 2LPM via [place]

## 2014-07-23 NOTE — Progress Notes (Signed)
ANTICOAGULATION CONSULT NOTE - Initial Consult  Pharmacy Consult for coumadin Indication: atrial fibrillation  Allergies  Allergen Reactions  . Colcrys [Colchicine]     Skin irritation.   . Contrast Media [Iodinated Diagnostic Agents] Other (See Comments)    Affected kidneys (yellow dye, pigment blue)  . Dabigatran Other (See Comments)    REACTION: Bleeding-low HGB  . Penicillins Swelling    Face swelling    Vital Signs: Temp: 97.6 F (36.4 C) (12/09 1703) Temp Source: Oral (12/09 1703) BP: 144/63 mmHg (12/09 1845) Pulse Rate: 55 (12/09 1845)  Labs:  Recent Labs  07/23/14 1706  HGB 11.3*  HCT 35.1*  PLT 177  LABPROT 29.9*  INR 2.82*  CREATININE 1.60*    Estimated Creatinine Clearance: 39.7 mL/min (by C-G formula based on Cr of 1.6).   Medical History: Past Medical History  Diagnosis Date  . Chronic atrial fibrillation     a. Failed prior DCCV x 2; b. chronic coumadin.  . Essential hypertension, benign   . Mixed hyperlipidemia   . Chronic systolic heart failure     a. EF 40-45% by echo 2012.  . GI bleed     December 2011 - subsequent discontinuation of Pradaxa  . Right bundle branch block   . Coronary atherosclerosis of native coronary artery     a. s/p MI 1998;  b. Chronically occluded LAD, patent DES diagonal, subtotally occluded anomalous circumflex - failed prior PCI;  b. 04/2013 Cath/PCI: LM 95d (3.0x16 Promus Premier DES), LAD 00, D1 patent stent, LCX small 100, RCA min irregs, R->L Collats.  . Bradycardia     a. s/p SJM nanostim leadless pacemaker implanted by Dr Rayann Heman  . Ischemic cardiomyopathy     LVEF 45%  . Pacemaker 01/18/2013    STJ Nanostim Leadless pacemaker implanted by Dr Rayann Heman  . History of pneumonia 1970's  . Type II diabetes mellitus   . Anemia   . History of blood transfusion 09/2010; 12/2012  . CVA (cerebral vascular accident) 09/2010    Suspected embolic while off anticoagulation.  . Arthritis   . CKD (chronic kidney disease) stage  3, GFR 30-59 ml/min     Assessment: Patient is an 80 y.o M on coumadin PTA for chronic afib.  He presented to the ED today with c/o SOB and malaise.  INR is therapeutic at 2.82.  Patient reported that home coumadin regimen is 2mg  daily except 3mg  on Tues and Fri  With last dose taken on 12/8.  Goal of Therapy:  INR 2-3    Plan:  1) resume home regimen of 2mg  daily except 3mg  on Tuesdays and Fridays. 2) daily INR  [name] [name] P 07/23/2014,7:11 PM

## 2014-07-23 NOTE — ED Notes (Signed)
Portable at bedside 

## 2014-07-23 NOTE — Patient Instructions (Signed)
   Please go to the [facility] ED to be evaluated.

## 2014-07-23 NOTE — Assessment & Plan Note (Signed)
Status post [employer] Jude leadless pacemaker per Dr. Rayann Heman.

## 2014-07-24 ENCOUNTER — Encounter (HOSPITAL_COMMUNITY): Payer: Self-pay | Admitting: *Deleted

## 2014-07-24 DIAGNOSIS — R001 Bradycardia, unspecified: Secondary | ICD-10-CM

## 2014-07-24 DIAGNOSIS — I517 Cardiomegaly: Secondary | ICD-10-CM

## 2014-07-24 DIAGNOSIS — R443 Hallucinations, unspecified: Secondary | ICD-10-CM

## 2014-07-24 DIAGNOSIS — I442 Atrioventricular block, complete: Secondary | ICD-10-CM | POA: Insufficient documentation

## 2014-07-24 LAB — BASIC METABOLIC PANEL
Anion gap: 13 (ref 5–15)
BUN: 44 mg/dL — AB (ref 6–23)
CALCIUM: 9.4 mg/dL (ref 8.4–10.5)
CO2: 26 meq/L (ref 19–32)
CREATININE: 1.58 mg/dL — AB (ref 0.50–1.35)
Chloride: 102 mEq/L (ref 96–112)
GFR calc Af Amer: 45 mL/min — ABNORMAL LOW (ref >90)
GFR, EST NON AFRICAN AMERICAN: 39 mL/min — AB (ref >90)
GLUCOSE: 91 mg/dL (ref 70–99)
Potassium: 4 mEq/L (ref 3.7–5.3)
Sodium: 141 mEq/L (ref 137–147)

## 2014-07-24 LAB — LIPID PANEL
CHOL/HDL RATIO: 2.8 ratio
Cholesterol: 72 mg/dL (ref 0–200)
HDL: 26 mg/dL — ABNORMAL LOW (ref >39)
LDL CALC: 33 mg/dL (ref 0–99)
Triglycerides: 66 mg/dL (ref <150)
VLDL: 13 mg/dL (ref 0–40)

## 2014-07-24 LAB — GLUCOSE, CAPILLARY
GLUCOSE-CAPILLARY: 166 mg/dL — AB (ref 70–99)
GLUCOSE-CAPILLARY: 218 mg/dL — AB (ref 70–99)

## 2014-07-24 LAB — PROTIME-INR
INR: 2.63 — AB (ref 0.00–1.49)
Prothrombin Time: 28.3 seconds — ABNORMAL HIGH (ref 11.6–15.2)

## 2014-07-24 LAB — TROPONIN I
Troponin I: <0.3 ng/mL (ref <0.30)
Troponin I: <0.3 ng/mL (ref <0.30)

## 2014-07-24 LAB — MRSA PCR SCREENING: MRSA BY PCR: NEGATIVE

## 2014-07-24 MED ORDER — CETYLPYRIDINIUM CHLORIDE 0.05 % MT LIQD
7.0000 mL | Freq: Two times a day (BID) | OROMUCOSAL | Status: DC
  Administered 2014-07-24 – 2014-07-27 (×6): 7 mL via OROMUCOSAL

## 2014-07-24 MED ORDER — PERFLUTREN LIPID MICROSPHERE
1.0000 mL | INTRAVENOUS | Status: AC | PRN
  Filled 2014-07-24: qty 10

## 2014-07-24 MED ORDER — INSULIN ASPART 100 UNIT/ML ~~LOC~~ SOLN
0.0000 [IU] | Freq: Every day | SUBCUTANEOUS | Status: DC
  Administered 2014-07-24 – 2014-07-26 (×2): 2 [IU] via SUBCUTANEOUS

## 2014-07-24 MED ORDER — PERFLUTREN LIPID MICROSPHERE
INTRAVENOUS | Status: AC
  Administered 2014-07-24: 17:00:00
  Filled 2014-07-24: qty 10

## 2014-07-24 MED ORDER — INSULIN ASPART 100 UNIT/ML ~~LOC~~ SOLN
0.0000 [IU] | Freq: Three times a day (TID) | SUBCUTANEOUS | Status: DC
  Administered 2014-07-24 – 2014-07-25 (×2): 2 [IU] via SUBCUTANEOUS
  Administered 2014-07-25 – 2014-07-26 (×2): 3 [IU] via SUBCUTANEOUS
  Administered 2014-07-26 – 2014-07-27 (×2): 2 [IU] via SUBCUTANEOUS

## 2014-07-24 NOTE — Care Management Note (Signed)
    Page 1 of 1   07/24/2014     2:58:57 PM CARE MANAGEMENT NOTE 07/24/2014  Patient:  [name] [name]   Account Number:  [id number]  Date Initiated:  07/24/2014  Documentation initiated by:  [facility]  Subjective/Objective Assessment:   Admitted with SOB - from OSH     Action/Plan:   Anticipated DC Date:  07/28/2014   Anticipated DC Plan:  [place]  CM consult      Choice offered to / List presented to:             Status of service:  In process, will continue to follow Medicare Important Message given?   (If response is "NO", the following Medicare IM given date fields will be blank) Date Medicare IM given:   Medicare IM given by:   Date Additional Medicare IM given:   Additional Medicare IM given by:    Discharge Disposition:    Per UR Regulation:  Reviewed for med. necessity/level of care/duration of stay  If discussed at [place] of Stay Meetings, dates discussed:    Comments:  ContactDhani, Dannemiller [place] Daughter [phone]  [phone]   Pierce,Channon Brougher Daughter [phone]  [phone]  07-24-14 2:50am Luz Lex, RNBSN [phone] Talked with wife, step daughter and patient in room. Patient pleasantly confused - but appears to be a tease also so difficult to know when joking or really confused. Not sure what is causing his SOB and weakness.  Will probably need PT when able - trying to interogate pacer today. Patient ok with going to an inhouse rehab center for [place] rehab if needed.  CM will continue to follow.

## 2014-07-24 NOTE — Progress Notes (Signed)
Pt transfered to room 3w12 with belongings, meds, and chart; pt denies complaints at this time. Pt family (daughter) accompanied transfer. Report given to Raquel Sarna, [place]

## 2014-07-24 NOTE — Progress Notes (Signed)
Offered Pt a bath,Pt stated that nightshift nurses gave him a bath early this am.

## 2014-07-24 NOTE — Progress Notes (Signed)
  Echocardiogram 2D Echocardiogram has been performed.  Joelene Millin 07/24/2014, 4:04 PM

## 2014-07-24 NOTE — Progress Notes (Signed)
Pt well known to me with permanent atrial fibrillation and complete heart block. Admitted with SOB and AMS.  He appears to have a generalized decline.  EP to interrogate leadless pacemaker:  SJM Nanostim pacemaker is interrogated by me and functioning normally in the VVIR pacing mode. Threshold @0 .4 msec.  R waves 10.5.  Impedance 450 Ohms, > 10 years battery remaining. 98% V paced with preserved histograms.  Normal device function I have increased lower pacing rate from 50 to 60 bpm to give patient increased heart rate with activity.  No other changes are made. Resume routine device follow-up.  Electrophysiology team to see as needed while here. Please call with questions.

## 2014-07-24 NOTE — Progress Notes (Signed)
ANTICOAGULATION CONSULT NOTE - Follow Up Consult  Pharmacy Consult for coumadin Indication: atrial fibrillation  Allergies  Allergen Reactions  . Colcrys [Colchicine]     Skin irritation.   . Contrast Media [Iodinated Diagnostic Agents] Other (See Comments)    Affected kidneys (yellow dye, pigment blue)  . Dabigatran Other (See Comments)    REACTION: Bleeding-low HGB  . Penicillins Swelling    Face swelling    Patient Measurements: Height: 5\' 8"  (172.7 cm) Weight: 204 lb 5.9 oz (92.7 kg) IBW/kg (Calculated) : 68.4  Vital Signs: Temp: 98.8 F (37.1 C) (12/10 1145) Temp Source: Oral (12/10 1145) BP: 151/55 mmHg (12/10 1145) Pulse Rate: 55 (12/10 1145)  Labs:  Recent Labs  07/23/14 1706 07/23/14 2044 07/24/14 0225 07/24/14 0237 07/24/14 0914  HGB 11.3*  --   --   --   --   HCT 35.1*  --   --   --   --   PLT 177  --   --   --   --   LABPROT 29.9*  --  28.3*  --   --   INR 2.82*  --  2.63*  --   --   CREATININE 1.60*  --  1.58*  --   --   TROPONINI  --  <0.30  --  <0.30 <0.30    Estimated Creatinine Clearance: 39.1 mL/min (by C-G formula based on Cr of 1.58).   Medications:  Scheduled:  . antiseptic oral rinse  7 mL Mouth Rinse BID  . atorvastatin  10 mg Oral Daily  . diltiazem  360 mg Oral Daily  . insulin aspart  0-5 Units Subcutaneous QHS  . insulin aspart  0-9 Units Subcutaneous TID WC  . insulin glargine  30 Units Subcutaneous QHS  . metoprolol  75 mg Oral TID  . torsemide  40 mg Oral Daily  . vitamin C  1,000 mg Oral Daily  . warfarin  2 mg Oral Once per day on Sun Mon Wed Thu Sat  . [START ON 07/25/2014] warfarin  3 mg Oral Once per day on Tue Fri  . Warfarin - Pharmacist Dosing Inpatient   Does not apply q1800    Assessment: 80 yo male here with SOB. He is on coumadin PTA for afib and pharmacy has been consulted to dose. INR today is 2.63 on home coumadin dose.   Home coumadin dose: 2mg  daily except 3mg  on Tues and Fri   Goal of Therapy:   INR 2-3 Monitor platelets by anticoagulation protocol: Yes   Plan:  -Continue coumadin at home dose -Daily PT/INR  Hildred Laser, Pharm D 07/24/2014 12:01 PM

## 2014-07-24 NOTE — Progress Notes (Signed)
       Patient Name: [name] [name] Date of Encounter: 07/24/2014    SUBJECTIVE: The patient is awake and we were able to have a conversation. Elements of the conversation were inaccurate and demonstrated clear confusion on the patient's part. His breathing is improved. He mentions having a "list that he clicks". Not sure what list he is referring to. He denies chest pain.  TELEMETRY:  Ventricular paced rhythm: Filed Vitals:   07/24/14 0645 07/24/14 0700 07/24/14 0800 07/24/14 1145  BP: 147/59 153/50 169/65 151/55  Pulse: 50 50 51 55  Temp:   98.3 F (36.8 C) 98.8 F (37.1 C)  TempSrc:   Oral Oral  Resp: 23 24 21 26   Height:      Weight:      SpO2: 97% 97% 100% 98%    Intake/Output Summary (Last 24 hours) at 07/24/14 1210 Last data filed at 07/24/14 1146  Gross per 24 hour  Intake    720 ml  Output   2125 ml  Net  -1405 ml   LABS: Basic Metabolic Panel:  Recent Labs  07/23/14 1706 07/24/14 0225  NA 140 141  K 4.5 4.0  CL 100 102  CO2 25 26  GLUCOSE 182* 91  BUN 45* 44*  CREATININE 1.60* 1.58*  CALCIUM 9.7 9.4   CBC:  Recent Labs  07/23/14 1706  WBC 10.3  HGB 11.3*  HCT 35.1*  MCV 95.6  PLT 177   Cardiac Enzymes:  Recent Labs  07/23/14 2044 07/24/14 0237 07/24/14 0914  TROPONINI <0.30 <0.30 <0.30   BNP: Invalid input(s): POCBNP Hemoglobin A1C: No results for input(s): HGBA1C in the last 72 hours. Fasting Lipid Panel:  Recent Labs  07/24/14 0225  CHOL 72  HDL 26*  LDLCALC 33  TRIG 66  CHOLHDL 2.8    Radiology/Studies:  The x-ray on 07/23/2014 demonstrates mild congestive heart failure  Physical Exam: Blood pressure 151/55, pulse 55, temperature 98.8 F (37.1 C), temperature source Oral, resp. rate 26, height 5\' 8"  (1.727 m), weight 204 lb 5.9 oz (92.7 kg), SpO2 98 %. Weight change:   Wt Readings from Last 3 Encounters:  07/24/14 204 lb 5.9 oz (92.7 kg)  07/23/14 209 lb (94.802 kg)  05/05/14 209 lb (94.802 kg)   Basilar  distant breath sounds and faint rales No gallop or murmur is heard No edema Neuro exam is significant for confusion  ASSESSMENT:  1. Probable acute on chronic combined systolic and diastolic heart failure. Last documented LVEF was 45% 2. Confusion and hallucinations, present now for at least 1 week. 3. Acute on chronic kidney injury, stage III 4. Chronic atrial fibrillation   Plan:  1. With stable renal function, we will continue diuresis. Will give oral demadex BID x 4 doses and reassess renal function. Usual dose of Demadex is 40 mg daily. 2. We will have pacemaker interrogated 3. Reassess LV function with echo 4. Transfer to floor 5. May need internal medicine assistance with general medical issues and disposition  Signed, Sinclair Grooms 07/24/2014, 12:10 PM

## 2014-07-24 NOTE — Plan of Care (Signed)
Problem: Phase I Progression Outcomes Goal: Pain controlled with appropriate interventions Outcome: Completed/Met Date Met:  07/24/14 Goal: OOB as tolerated unless otherwise ordered Outcome: Progressing Pt experiencing dyspnea on exertion  Goal: Initial discharge plan identified Outcome: Completed/Met Date Met:  07/24/14 Goal: Voiding-avoid urinary catheter unless indicated Outcome: Completed/Met Date Met:  07/24/14 Goal: Hemodynamically stable Outcome: Completed/Met Date Met:  07/24/14

## 2014-07-25 ENCOUNTER — Encounter: Payer: Self-pay | Admitting: Internal Medicine

## 2014-07-25 LAB — GLUCOSE, CAPILLARY
GLUCOSE-CAPILLARY: 69 mg/dL — AB (ref 70–99)
GLUCOSE-CAPILLARY: 211 mg/dL — AB (ref 70–99)
GLUCOSE-CAPILLARY: 139 mg/dL — AB (ref 70–99)
Glucose-Capillary: 120 mg/dL — ABNORMAL HIGH (ref 70–99)
Glucose-Capillary: 152 mg/dL — ABNORMAL HIGH (ref 70–99)

## 2014-07-25 LAB — PROTIME-INR
INR: 2.37 — ABNORMAL HIGH (ref 0.00–1.49)
Prothrombin Time: 26.1 seconds — ABNORMAL HIGH (ref 11.6–15.2)

## 2014-07-25 MED ORDER — WARFARIN SODIUM 3 MG PO TABS
3.0000 mg | ORAL_TABLET | ORAL | Status: DC
Start: 2014-07-25 — End: 2014-07-27
  Administered 2014-07-25: 3 mg via ORAL
  Filled 2014-07-25: qty 1

## 2014-07-25 MED ORDER — CLOPIDOGREL BISULFATE 75 MG PO TABS
75.0000 mg | ORAL_TABLET | Freq: Every day | ORAL | Status: DC
  Administered 2014-07-25 – 2014-07-27 (×3): 75 mg via ORAL
  Filled 2014-07-25 (×3): qty 1

## 2014-07-25 MED ORDER — TORSEMIDE 20 MG PO TABS
40.0000 mg | ORAL_TABLET | Freq: Two times a day (BID) | ORAL | Status: AC
  Administered 2014-07-25 – 2014-07-26 (×4): 40 mg via ORAL
  Filled 2014-07-25 (×4): qty 2

## 2014-07-25 MED ORDER — WARFARIN SODIUM 2 MG PO TABS
2.0000 mg | ORAL_TABLET | ORAL | Status: DC
  Administered 2014-07-26: 2 mg via ORAL
  Filled 2014-07-25: qty 1

## 2014-07-25 NOTE — Progress Notes (Signed)
ANTICOAGULATION CONSULT NOTE - Follow Up Consult  Pharmacy Consult for coumadin Indication: atrial fibrillation  Allergies  Allergen Reactions  . Colcrys [Colchicine]     Skin irritation.   . Contrast Media [Iodinated Diagnostic Agents] Other (See Comments)    Affected kidneys (yellow dye, pigment blue)  . Dabigatran Other (See Comments)    REACTION: Bleeding-low HGB  . Penicillins Swelling    Face swelling    Patient Measurements: Height: 5\' 8"  (172.7 cm) Weight: 201 lb 1.6 oz (91.218 kg) IBW/kg (Calculated) : 68.4  Vital Signs: Temp: 97.7 F (36.5 C) (12/11 0501) Temp Source: Oral (12/11 0501) BP: 123/64 mmHg (12/11 1024) Pulse Rate: 64 (12/11 1024)  Labs:  Recent Labs  07/23/14 1706 07/23/14 2044 07/24/14 0225 07/24/14 0237 07/24/14 0914 07/25/14 0343  HGB 11.3*  --   --   --   --   --   HCT 35.1*  --   --   --   --   --   PLT 177  --   --   --   --   --   LABPROT 29.9*  --  28.3*  --   --  26.1*  INR 2.82*  --  2.63*  --   --  2.37*  CREATININE 1.60*  --  1.58*  --   --   --   TROPONINI  --  <0.30  --  <0.30 <0.30  --     Estimated Creatinine Clearance: 38.8 mL/min (by C-G formula based on Cr of 1.58).   Medications:  Scheduled:  . antiseptic oral rinse  7 mL Mouth Rinse BID  . atorvastatin  10 mg Oral Daily  . clopidogrel  75 mg Oral Daily  . diltiazem  360 mg Oral Daily  . insulin aspart  0-5 Units Subcutaneous QHS  . insulin aspart  0-9 Units Subcutaneous TID WC  . insulin glargine  30 Units Subcutaneous QHS  . metoprolol  75 mg Oral TID  . torsemide  40 mg Oral BID  . vitamin C  1,000 mg Oral Daily  . [START ON 07/26/2014] warfarin  2 mg Oral Once per day on Sun Mon Wed Thu Sat  . warfarin  3 mg Oral Once per day on Tue Fri  . Warfarin - Pharmacist Dosing Inpatient   Does not apply q1800    Assessment: 80 yo male here with SOB. He is on coumadin PTA for afib and pharmacy has been consulted to dose. INR has remained therapeutic so we'll  change to MWF  Home coumadin dose: 2mg  daily except 3mg  on Tues and Fri   Goal of Therapy:  INR 2-3 Monitor platelets by anticoagulation protocol: Yes   Plan:   Continue coumadin at home dose MWF INR  Onnie Boer, PharmD Pager: [phone] 07/25/2014 10:29 AM

## 2014-07-25 NOTE — Progress Notes (Signed)
Subjective:  Breathing much better. Denies CP  Objective:  Temp:  [97.5 F (36.4 C)-97.7 F (36.5 C)] 97.5 F (36.4 C) (12/11 1632) Pulse Rate:  [63-66] 63 (12/11 1632) Resp:  [18-20] 18 (12/11 1632) BP: (123-148)/(63-69) 136/69 mmHg (12/11 1632) SpO2:  [98 %-99 %] 99 % (12/11 1632) Weight:  [201 lb 1.6 oz (91.218 kg)-202 lb 12.8 oz (91.989 kg)] 201 lb 1.6 oz (91.218 kg) (12/11 0501) Weight change: -1 lb 9.1 oz (-0.71 kg)  Intake/Output from previous day: 12/10 0701 - 12/11 0700 In: 720 [P.O.:720] Out: 1550 [Urine:1550]  Intake/Output from this shift: Total I/O In: 240 [P.O.:240] Out: 1425 [Urine:1425]  Physical Exam: General appearance: alert and no distress Neck: no adenopathy, no carotid bruit, no JVD, supple, symmetrical, trachea midline and thyroid not enlarged, symmetric, no tenderness/mass/nodules Lungs: clear to auscultation bilaterally Heart: regular rate and rhythm, S1, S2 normal, no murmur, click, rub or gallop Extremities: 1+ ankle edema bilaterally]  Lab Results: Results for orders placed or performed during the hospital encounter of 07/23/14 (from the past 48 hour(s))  Urinalysis, Routine w reflex microscopic     Status: Abnormal   Collection Time: 07/23/14  7:30 PM  Result Value Ref Range   Color, Urine YELLOW YELLOW   APPearance CLEAR CLEAR   Specific Gravity, Urine 1.012 1.005 - 1.030   pH 5.0 5.0 - 8.0   Glucose, UA NEGATIVE NEGATIVE mg/dL   Hgb urine dipstick NEGATIVE NEGATIVE   Bilirubin Urine NEGATIVE NEGATIVE   Ketones, ur NEGATIVE NEGATIVE mg/dL   Protein, ur 100 (A) NEGATIVE mg/dL   Urobilinogen, UA 1.0 0.0 - 1.0 mg/dL   Nitrite NEGATIVE NEGATIVE   Leukocytes, UA NEGATIVE NEGATIVE  Urine microscopic-add on     Status: Abnormal   Collection Time: 07/23/14  7:30 PM  Result Value Ref Range   Squamous Epithelial / LPF RARE RARE   WBC, UA 0-2 <3 WBC/hpf   Bacteria, UA RARE RARE   Casts HYALINE CASTS (A) NEGATIVE  Troponin I-(serum)      Status: None   Collection Time: 07/23/14  8:44 PM  Result Value Ref Range   Troponin I <0.30 <0.30 ng/mL    Comment:        Due to the release kinetics of cTnI, a negative result within the first hours of the onset of symptoms does not rule out myocardial infarction with certainty. If myocardial infarction is still suspected, repeat the test at appropriate intervals.   Protime-INR     Status: Abnormal   Collection Time: 07/24/14  2:25 AM  Result Value Ref Range   Prothrombin Time 28.3 (H) 11.6 - 15.2 seconds   INR 2.63 (H) 0.00 - 5.17  Basic metabolic panel     Status: Abnormal   Collection Time: 07/24/14  2:25 AM  Result Value Ref Range   Sodium 141 137 - 147 mEq/L   Potassium 4.0 3.7 - 5.3 mEq/L   Chloride 102 96 - 112 mEq/L   CO2 26 19 - 32 mEq/L   Glucose, Bld 91 70 - 99 mg/dL   BUN 44 (H) 6 - 23 mg/dL   Creatinine, Ser 1.58 (H) 0.50 - 1.35 mg/dL   Calcium 9.4 8.4 - 10.5 mg/dL   GFR calc non Af Amer 39 (L) >90 mL/min   GFR calc Af Amer 45 (L) >90 mL/min    Comment: (NOTE) The eGFR has been calculated using the CKD EPI equation. This calculation has not been validated in all clinical  situations. eGFR's persistently <90 mL/min signify possible Chronic Kidney Disease.    Anion gap 13 5 - 15  Lipid panel     Status: Abnormal   Collection Time: 07/24/14  2:25 AM  Result Value Ref Range   Cholesterol 72 0 - 200 mg/dL   Triglycerides 66 <150 mg/dL   HDL 26 (L) >39 mg/dL   Total CHOL/HDL Ratio 2.8 RATIO   VLDL 13 0 - 40 mg/dL   LDL Cholesterol 33 0 - 99 mg/dL    Comment:        Total Cholesterol/HDL:CHD Risk Coronary Heart Disease Risk Table                     Men   Women  1/2 Average Risk   3.4   3.3  Average Risk       5.0   4.4  2 X Average Risk   9.6   7.1  3 X Average Risk  23.4   11.0        Use the calculated Patient Ratio above and the CHD Risk Table to determine the patient's CHD Risk.        ATP III CLASSIFICATION (LDL):  <100     mg/dL   Optimal   100-129  mg/dL   Near or Above                    Optimal  130-159  mg/dL   Borderline  160-189  mg/dL   High  >190     mg/dL   Very High   Troponin I-(serum)     Status: None   Collection Time: 07/24/14  2:37 AM  Result Value Ref Range   Troponin I <0.30 <0.30 ng/mL    Comment:        Due to the release kinetics of cTnI, a negative result within the first hours of the onset of symptoms does not rule out myocardial infarction with certainty. If myocardial infarction is still suspected, repeat the test at appropriate intervals.   MRSA PCR Screening     Status: None   Collection Time: 07/24/14  4:54 AM  Result Value Ref Range   MRSA by PCR NEGATIVE NEGATIVE    Comment:        The GeneXpert MRSA Assay (FDA approved for NASAL specimens only), is one component of a comprehensive MRSA colonization surveillance program. It is not intended to diagnose MRSA infection nor to guide or monitor treatment for MRSA infections.   Troponin I-(serum)     Status: None   Collection Time: 07/24/14  9:14 AM  Result Value Ref Range   Troponin I <0.30 <0.30 ng/mL    Comment:        Due to the release kinetics of cTnI, a negative result within the first hours of the onset of symptoms does not rule out myocardial infarction with certainty. If myocardial infarction is still suspected, repeat the test at appropriate intervals.   Glucose, capillary     Status: Abnormal   Collection Time: 07/24/14  5:45 PM  Result Value Ref Range   Glucose-Capillary 166 (H) 70 - 99 mg/dL   Comment 1 Notify RN   Glucose, capillary     Status: Abnormal   Collection Time: 07/24/14  9:21 PM  Result Value Ref Range   Glucose-Capillary 218 (H) 70 - 99 mg/dL   Comment 1 Notify RN   Protime-INR     Status: Abnormal   Collection  Time: 07/25/14  3:43 AM  Result Value Ref Range   Prothrombin Time 26.1 (H) 11.6 - 15.2 seconds   INR 2.37 (H) 0.00 - 1.49  Glucose, capillary     Status: Abnormal   Collection Time:  07/25/14  7:28 AM  Result Value Ref Range   Glucose-Capillary 69 (L) 70 - 99 mg/dL   Comment 1 Documented in Chart    Comment 2 Notify RN   Glucose, capillary     Status: Abnormal   Collection Time: 07/25/14  8:23 AM  Result Value Ref Range   Glucose-Capillary 120 (H) 70 - 99 mg/dL   Comment 1 Documented in Chart    Comment 2 Notify RN   Glucose, capillary     Status: Abnormal   Collection Time: 07/25/14 11:35 AM  Result Value Ref Range   Glucose-Capillary 152 (H) 70 - 99 mg/dL   Comment 1 Documented in Chart    Comment 2 Notify RN   Glucose, capillary     Status: Abnormal   Collection Time: 07/25/14  4:30 PM  Result Value Ref Range   Glucose-Capillary 211 (H) 70 - 99 mg/dL   Comment 1 Documented in Chart    Comment 2 Notify RN     Imaging: Imaging results have been reviewed  Tele- Paced rhythm at 65  Assessment/Plan:   1. Active Problems: 2.   CAD, NATIVE VESSEL 3.   Chronic atrial fibrillation 4.   CKD (chronic kidney disease), stage III 5.   Shortness of breath 6.   Pacemaker 7.   Hallucinations 8.   Acute on chronic combined systolic and diastolic CHF (congestive heart failure) 9.   Warfarin anticoagulation 10.   Complete heart block 11.   Time Spent Directly with Patient:  20 minutes  Length of Stay:  LOS: 2 days   Pt was Tx out of 2H. Admitted with CHF. He has a leadless PM that was interrogated by Dr. Rayann Heman and found to be functioning normally. Rate parameters were adjusted. 2D shows decrease in EF 45%----> 30% for unclear reasons. He has diuresed nicely. Now on PO Demadex. INR therapeutic.His BNP was 6K and his CXR showed CHF. Will watch over weekend . Probably home Sunday or Monday.   Lorretta Harp 07/25/2014, 6:26 PM

## 2014-07-26 DIAGNOSIS — I442 Atrioventricular block, complete: Secondary | ICD-10-CM

## 2014-07-26 DIAGNOSIS — Z950 Presence of cardiac pacemaker: Secondary | ICD-10-CM

## 2014-07-26 DIAGNOSIS — Z7901 Long term (current) use of anticoagulants: Secondary | ICD-10-CM

## 2014-07-26 LAB — GLUCOSE, CAPILLARY
GLUCOSE-CAPILLARY: 157 mg/dL — AB (ref 70–99)
GLUCOSE-CAPILLARY: 224 mg/dL — AB (ref 70–99)
Glucose-Capillary: 62 mg/dL — ABNORMAL LOW (ref 70–99)
Glucose-Capillary: 231 mg/dL — ABNORMAL HIGH (ref 70–99)

## 2014-07-26 MED ORDER — CARVEDILOL 12.5 MG PO TABS
12.5000 mg | ORAL_TABLET | Freq: Two times a day (BID) | ORAL | Status: DC
Start: 2014-07-26 — End: 2014-07-27
  Administered 2014-07-26 – 2014-07-27 (×3): 12.5 mg via ORAL
  Filled 2014-07-26 (×3): qty 1

## 2014-07-26 MED ORDER — LISINOPRIL 2.5 MG PO TABS
2.5000 mg | ORAL_TABLET | Freq: Every day | ORAL | Status: DC
  Administered 2014-07-26 – 2014-07-27 (×2): 2.5 mg via ORAL
  Filled 2014-07-26 (×2): qty 1

## 2014-07-26 NOTE — Progress Notes (Signed)
SUBJECTIVE: The patient is doing well today.  At this time, he denies chest pain, shortness of breath, or any new concerns.  Marland Kitchen antiseptic oral rinse  7 mL Mouth Rinse BID  . atorvastatin  10 mg Oral Daily  . carvedilol  12.5 mg Oral BID WC  . clopidogrel  75 mg Oral Daily  . insulin aspart  0-5 Units Subcutaneous QHS  . insulin aspart  0-9 Units Subcutaneous TID WC  . insulin glargine  30 Units Subcutaneous QHS  . lisinopril  2.5 mg Oral Daily  . torsemide  40 mg Oral BID  . vitamin C  1,000 mg Oral Daily  . warfarin  2 mg Oral Once per day on Sun Mon Wed Thu Sat  . warfarin  3 mg Oral Once per day on Tue Fri  . Warfarin - Pharmacist Dosing Inpatient   Does not apply q1800      OBJECTIVE: Physical Exam: Filed Vitals:   07/25/14 1024 07/25/14 1632 07/25/14 2022 07/26/14 0543  BP: 123/64 136/69 127/62 133/59  Pulse: 64 63 72 78  Temp:  97.5 F (36.4 C) 97.4 F (36.3 C) 97.5 F (36.4 C)  TempSrc:  Oral Oral Oral  Resp:  18 18 18   Height:      Weight:    194 lb 11.2 oz (88.315 kg)  SpO2:  99% 98% 100%    Intake/Output Summary (Last 24 hours) at 07/26/14 0846 Last data filed at 07/26/14 0820  Gross per 24 hour  Intake    720 ml  Output   3225 ml  Net  -2505 ml    Telemetry reveals sinus rhythm  GEN- The patient is well appearing, alert and oriented x 3 today.   Head- normocephalic, atraumatic Eyes-  Sclera clear, conjunctiva pink Ears- hearing intact Oropharynx- clear Neck- supple, no JVP Lymph- no cervical lymphadenopathy Lungs- Clear to ausculation bilaterally, normal work of breathing Heart- Regular rate and rhythm, no murmurs, rubs or gallops, PMI not laterally displaced GI- soft, NT, ND, + BS Extremities- no clubbing, cyanosis, or edema Skin- no rash or lesion Psych- euthymic mood, full affect Neuro- strength and sensation are intact  LABS: Basic Metabolic Panel:  Recent Labs  07/23/14 1706 07/24/14 0225  NA 140 141  K 4.5 4.0  CL 100 102    CO2 25 26  GLUCOSE 182* 91  BUN 45* 44*  CREATININE 1.60* 1.58*  CALCIUM 9.7 9.4   Liver Function Tests: No results for input(s): AST, ALT, ALKPHOS, BILITOT, PROT, ALBUMIN in the last 72 hours. No results for input(s): LIPASE, AMYLASE in the last 72 hours. CBC:  Recent Labs  07/23/14 1706  WBC 10.3  HGB 11.3*  HCT 35.1*  MCV 95.6  PLT 177   Cardiac Enzymes:  Recent Labs  07/23/14 2044 07/24/14 0237 07/24/14 0914  TROPONINI <0.30 <0.30 <0.30   BNP: Invalid input(s): POCBNP D-Dimer: No results for input(s): DDIMER in the last 72 hours. Hemoglobin A1C: No results for input(s): HGBA1C in the last 72 hours. Fasting Lipid Panel:  Recent Labs  07/24/14 0225  CHOL 72  HDL 26*  LDLCALC 33  TRIG 66  CHOLHDL 2.8   Thyroid Function Tests: No results for input(s): TSH, T4TOTAL, T3FREE, THYROIDAB in the last 72 hours.  Invalid input(s): FREET3 Anemia Panel: No results for input(s): VITAMINB12, FOLATE, FERRITIN, TIBC, IRON, RETICCTPCT in the last 72 hours.  RADIOLOGY: Dg Chest Portable 1 View  07/23/2014   CLINICAL DATA:  Progressive shortness of breath.  EXAM: PORTABLE CHEST - 1 VIEW  COMPARISON:  07/18/2014  FINDINGS: There is cardiomegaly. Pulmonary vascularity is within normal limits. There is persistent accentuation of the interstitial markings with small bilateral pleural effusions, unchanged.  IMPRESSION: Mild congestive heart failure, essentially unchanged since the prior exam.   Electronically Signed   By: Rozetta Nunnery M.D.   On: 07/23/2014 20:15    ASSESSMENT AND PLAN:  Active Problems:   CAD, NATIVE VESSEL   Chronic atrial fibrillation   CKD (chronic kidney disease), stage III   Shortness of breath   Pacemaker   Hallucinations   Acute on chronic combined systolic and diastolic CHF (congestive heart failure)   Warfarin anticoagulation   Complete heart block  1. Acute on chronic combined systolic/ diastolic dysfunction Improving with diuresis but  remains volume overloaded BMET not ordered today Will convert diltiazem and metoprolol to coreg Add low dose ace inhibitor and follow creatinine closely  2. CRI Limits our ability to manage CHF Will follow creatinine closely with addition of ace inhibitor today  3. Permanent afib Continue coumadin Rates are controlled  4. Complete heart block Normal leadless pacemaker function  5. Hypertensive cardiovascular disease Medicines adjusted as above  6. Deconditioning Ambulate in halls Will consult PT  He is still at very high risk for decompensation/ readmission.  He is not yet ready for discharge.   Will continue to manage closely over the weekend.   Thompson Grayer, MD 07/26/2014 8:46 AM

## 2014-07-27 ENCOUNTER — Other Ambulatory Visit: Payer: Self-pay | Admitting: Physician Assistant

## 2014-07-27 DIAGNOSIS — R0602 Shortness of breath: Secondary | ICD-10-CM

## 2014-07-27 DIAGNOSIS — N189 Chronic kidney disease, unspecified: Secondary | ICD-10-CM

## 2014-07-27 LAB — BASIC METABOLIC PANEL
ANION GAP: 15 (ref 5–15)
BUN: 45 mg/dL — AB (ref 6–23)
CALCIUM: 8.9 mg/dL (ref 8.4–10.5)
CHLORIDE: 96 meq/L (ref 96–112)
CO2: 27 meq/L (ref 19–32)
CREATININE: 1.69 mg/dL — AB (ref 0.50–1.35)
GFR calc Af Amer: 41 mL/min — ABNORMAL LOW (ref >90)
GFR calc non Af Amer: 36 mL/min — ABNORMAL LOW (ref >90)
GLUCOSE: 292 mg/dL — AB (ref 70–99)
Potassium: 3.9 mEq/L (ref 3.7–5.3)
Sodium: 138 mEq/L (ref 137–147)

## 2014-07-27 LAB — GLUCOSE, CAPILLARY
GLUCOSE-CAPILLARY: 151 mg/dL — AB (ref 70–99)
Glucose-Capillary: 124 mg/dL — ABNORMAL HIGH (ref 70–99)

## 2014-07-27 MED ORDER — CARVEDILOL 12.5 MG PO TABS: 12.5000 mg | ORAL_TABLET | Freq: Two times a day (BID) | ORAL | Status: DC

## 2014-07-27 MED ORDER — LISINOPRIL 2.5 MG PO TABS: 2.5000 mg | ORAL_TABLET | Freq: Every day | ORAL | Status: DC

## 2014-07-27 NOTE — Discharge Summary (Signed)
Physician Discharge Summary     Cardiologist: Domenic Polite  PCP: Rory Percy, MD  Patient ID: [name] [name] MRN: [id number] DOB/AGE: 04-10-1929 80 y.o.  Admit date: 07/23/2014 Discharge date: 07/27/2014  Admission Diagnoses:    Acute on chronic combined systolic and diastolic CHF (congestive heart failure)  Discharge Diagnoses:  Active Problems:   CAD, NATIVE VESSEL   Chronic atrial fibrillation   CKD (chronic kidney disease), stage III   Shortness of breath   Pacemaker   Hallucinations   Acute on chronic combined systolic and diastolic CHF (congestive heart failure)   Warfarin anticoagulation   Complete heart block   Diabetes  Discharged Condition: stable  Hospital Course:   [name] [name] is a 80 y.o.male. He was recently hospitalized at [facility]. There he was treated for both pulmonary issues and CHF. He was discharged home. He has not recovered to his baseline. His family says that he has continued shortness of breath. He also has had some disorientation and possibly some hallucinations. He was seen at length by Dr. Domenic Polite in the office today. His complete office note is listed in this record just prior to this note. The patient has a leadless pacemaker. It was interrogated within the past week. By report it was working well. He has an ejection fraction of 45% by history. There is chronic atrial fibrillation. He has known coronary disease with an intervention in 2014. Because of this he is on Coumadin and Plavix.  While in the emergency room this evening, the family insists that they saw an area flat line on his monitor. He sat up rapidly. There was no nursing staff in the room. We do not document any pacemaker failure on the monitor.  The patient was admitted for IV diuresis and changed to po torsemide which resulted in net loss of 6.8L.  He now appears euvolemic.  We did a 2D echo which revealed an EF of 30% with akinesis inferior wall and apex and septum.  This is  reduced from prior echo.  No complaints of CP.  His device was interrogated and showed normal function.  HR remained controlled.  INR therapeutic.  DM stable for the most part.  He had a mild increase in SCr.  We will recheck this next week.  Avoid benzodiazepines.   Diltiazem and metoprolol were changed coreg and a low dose ACE added.  BP is stable.  The patient was seen by Dr. Rayann Heman who felt he was stable for DC home.  Follow up will be arranged.  Daily weight monitoring and low sodium diet were reinforced.     Consults:  None  Significant Diagnostic Studies:  Study Conclusions  - Left ventricle: Akinesis inferior wall and apex and septum. The cavity size was mildly dilated. Wall thickness was increased in a pattern of mild LVH. The estimated ejection fraction was 30%. - Aortic valve: Sclerosis without stenosis. There was no significant regurgitation. - Left atrium: The atrium was moderately dilated. - Right ventricle: I am aware that the patient has a &quot;wireless pacemaker.&quot; I do not know exactly where it is implanted. In some images, there is question of a wire in the RA or RV. This may be artifact from the pacer? The cavity size was normal. Systolic function was normal. - Right atrium: The atrium was mildly dilated.  BMET    Component Value Date/Time   NA 138 07/27/2014 0245   K 3.9 07/27/2014 0245   CL 96 07/27/2014 0245   CO2 27 07/27/2014  0245   GLUCOSE 292* 07/27/2014 0245   BUN 45* 07/27/2014 0245   CREATININE 1.69* 07/27/2014 0245   CALCIUM 8.9 07/27/2014 0245   GFRNONAA 36* 07/27/2014 0245   GFRAA 41* 07/27/2014 0245      Treatments: See above  Discharge Exam: Blood pressure 110/64, pulse 68, temperature 97.7 F (36.5 C), temperature source Oral, resp. rate 20, height 5\' 8"  (1.727 m), weight 192 lb 1.6 oz (87.136 kg), SpO2 99 %.   Disposition: 01-Home or Self Care      Discharge Instructions    Diet - low sodium heart healthy     Complete by:  As directed      Discharge instructions    Complete by:  As directed   Monitor your weight every morning.  If you gain 3 pounds in 24 hours, or 5 pounds in a week, call the office for instructions.     Increase activity slowly    Complete by:  As directed             Medication List    STOP taking these medications        diltiazem 360 MG 24 hr capsule  Commonly known as:  CARDIZEM CD     LORazepam 1 MG tablet  Commonly known as:  ATIVAN     metoprolol 50 MG tablet  Commonly known as:  LOPRESSOR      TAKE these medications        atorvastatin 10 MG tablet  Commonly known as:  LIPITOR  TAKE ONE TABLET BY MOUTH EVERY DAY     carvedilol 12.5 MG tablet  Commonly known as:  COREG  Take 1 tablet (12.5 mg total) by mouth 2 (two) times daily with a meal.     clopidogrel 75 MG tablet  Commonly known as:  PLAVIX  TAKE ONE TABLET BY MOUTH EVERY DAY WITH BREAKFAST     guaiFENesin 600 MG 12 hr tablet  Commonly known as:  MUCINEX  Take 600 mg by mouth 2 (two) times daily as needed for congestion.     ipratropium-albuterol 0.5-2.5 (3) MG/3ML Soln  Commonly known as:  DUONEB  Take 3 mLs by nebulization every 4 (four) hours as needed.     LANTUS 100 UNIT/ML injection  Generic drug:  insulin glargine  Inject 30 Units into the skin at bedtime.     lisinopril 2.5 MG tablet  Commonly known as:  PRINIVIL,ZESTRIL  Take 1 tablet (2.5 mg total) by mouth daily.     nitroGLYCERIN 0.4 MG SL tablet  Commonly known as:  NITROSTAT  Place 1 tablet (0.4 mg total) under the tongue every 5 (five) minutes as needed for chest pain.     torsemide 20 MG tablet  Commonly known as:  DEMADEX  Take 20-40 mg by mouth daily.     vitamin C 1000 MG tablet  Take 1,000 mg by mouth daily.     warfarin 2 MG tablet  Commonly known as:  COUMADIN  Take 2-3 mg by mouth See admin instructions. Managed by Dr. Nadara Mustard.  Take as directed by the coumadin clinic INR is 3.5 as of 07-22-14. Take 2mg   on Sunday, Mon, Wed, [place], and Sat. 3mg  on Tue and Friday       Follow-up Information    Follow up with Rozann Lesches, MD.   Specialty:  Cardiology   Why:  The office will call you on Monday with the follow up appt date and time.    Contact  information:   [address] [place] [address] [phone]       Follow up with Labs On 07/30/2014.   Why:  Go to the lab of your choice on Wednesday so we can check your kidney function.      Greater than 30 minutes was spent completing the patient's discharge.    SignedTarri Fuller, [place] 07/27/2014, 8:54 AM  Thompson Grayer MD

## 2014-07-27 NOTE — Evaluation (Signed)
Physical Therapy Evaluation Patient Details Name: [name] [name] MRN: [id number] DOB: 07/02/1929 Today's Date: 07/27/2014   History of Present Illness  Pt is an 80 y.o. male admitted with shortness of breath for which he was previously hospitalized recently. Pt with pacemaker from CHF and afib.     Clinical Impression  Pt is safely functioning at or near his baseline mobility status. Pt able to ambulate for extended distance, without assistive device while attenuating to challenges without loss of balance. Pt is quick to move, which PT discussed importance of slowing down for safety. Pt has necessary assistance at home and does not require ADs. Pt verbalized that he understood. Pt does not require further acute PT or PT at discharge.     Follow Up Recommendations No PT follow up    Equipment Recommendations  None recommended by PT    Recommendations for Other Services       Precautions / Restrictions Precautions Precautions: Fall Restrictions Weight Bearing Restrictions: No      Mobility  Bed Mobility               General bed mobility comments: Pt sitting in chair at start of session. Unable to assess.   Transfers Overall transfer level: Needs assistance Equipment used: None Transfers: Sit to/from Stand Sit to Stand: Supervision         General transfer comment: Pt did not require any physical assistance or cuing for transfer from chair. Pt quick to stand up so requiring supervision for safety.  Ambulation/Gait Ambulation/Gait assistance: Supervision Ambulation Distance (Feet): 400 Feet Assistive device: None Gait Pattern/deviations: Step-through pattern;Wide base of support     General Gait Details: Pt able to ambulate in hallway without AD and accepting challenges without loss of balance. Pt able to ambulate with speed changes, head turning and negotiating obstacles safely. Pt reported no shortness of breath during ambulation.   Stairs            Wheelchair Mobility    Modified Rankin (Stroke Patients Only)       Balance Overall balance assessment: Needs assistance         Standing balance support: During functional activity;No upper extremity supported Standing balance-Leahy Scale: Good Standing balance comment: Pt able to ambulate without support while performing challenges.                              Pertinent Vitals/Pain Pain Assessment: No/denies pain    Home Living Family/patient expects to be discharged to:: Private residence Living Arrangements: Spouse/significant other Available Help at Discharge: Family Type of Home: House Home Access: Stairs to enter Entrance Stairs-Rails: None Entrance Stairs-Number of Steps: 1 Home Layout: One level Home Equipment: Environmental consultant - 2 wheels;[place] - 4 wheels;Cane - single point;Crutches;Wheelchair - manual      Prior Function Level of Independence: Independent               Hand Dominance        Extremity/Trunk Assessment               Lower Extremity Assessment: Overall WFL for tasks assessed         Communication   Communication: No difficulties  Cognition Arousal/Alertness: Awake/alert Behavior During Therapy: WFL for tasks assessed/performed Overall Cognitive Status: Within Functional Limits for tasks assessed                      General Comments  Exercises        Assessment/Plan    PT Assessment Patent does not need any further PT services  PT Diagnosis Difficulty walking   PT Problem List    PT Treatment Interventions     PT Goals (Current goals can be found in the Care Plan section) Acute Rehab PT Goals Patient Stated Goal: Go home    Frequency     Barriers to discharge        Co-evaluation               End of Session Equipment Utilized During Treatment: Gait belt Activity Tolerance: Patient tolerated treatment well Patient left: in chair;with call bell/phone within reach            Time: 0901-0920 PT Time Calculation (min) (ACUTE ONLY): 19 min   Charges:   PT Evaluation $Initial PT Evaluation Tier I: 1 Procedure PT Treatments $Gait Training: 8-22 mins   PT G CodesJearld Shines SPT 07/27/2014, 10:09 AM  Jearld Shines, SPT  Acute Rehabilitation [phone]

## 2014-07-27 NOTE — Progress Notes (Signed)
Subjective: Feeling better.  Objective: Vital signs in last 24 hours: Temp:  [97.7 F (36.5 C)-98.1 F (36.7 C)] 97.7 F (36.5 C) (12/13 0500) Pulse Rate:  [62-70] 63 (12/13 0500) Resp:  [19-20] 20 (12/13 0500) BP: (102-120)/(50-58) 116/56 mmHg (12/13 0500) SpO2:  [97 %-99 %] 99 % (12/13 0500) Weight:  [192 lb 1.6 oz (87.136 kg)] 192 lb 1.6 oz (87.136 kg) (12/13 0500) Last BM Date: 07/23/14  Intake/Output from previous day: 12/12 0701 - 12/13 0700 In: 480 [P.O.:480] Out: 2280 [Urine:2280] Intake/Output this shift:    Medications Current Facility-Administered Medications  Medication Dose Route Frequency Provider Last Rate Last Dose  . acetaminophen (TYLENOL) tablet 650 mg  650 mg Oral Q4H PRN Almyra Deforest, PA      . antiseptic oral rinse (CPC / CETYLPYRIDINIUM CHLORIDE 0.05%) solution 7 mL  7 mL Mouth Rinse BID Carlena Bjornstad, MD   7 mL at 07/26/14 2141  . atorvastatin (LIPITOR) tablet 10 mg  10 mg Oral Daily Almyra Deforest, [place]   10 mg at 07/26/14 1007  . carvedilol (COREG) tablet 12.5 mg  12.5 mg Oral BID WC Coralyn Mark, MD   12.5 mg at 07/26/14 1749  . clopidogrel (PLAVIX) tablet 75 mg  75 mg Oral Daily Belva Crome III, MD   75 mg at 07/26/14 1007  . guaiFENesin (MUCINEX) 12 hr tablet 600 mg  600 mg Oral BID PRN Almyra Deforest, PA      . insulin aspart (novoLOG) injection 0-5 Units  0-5 Units Subcutaneous QHS Sinclair Grooms, MD   2 Units at 07/26/14 2137  . insulin aspart (novoLOG) injection 0-9 Units  0-9 Units Subcutaneous TID WC Sinclair Grooms, MD   3 Units at 07/26/14 1749  . insulin glargine (LANTUS) injection 30 Units  30 Units Subcutaneous QHS Almyra Deforest, [place]   30 Units at 07/26/14 2138  . ipratropium-albuterol (DUONEB) 0.5-2.5 (3) MG/3ML nebulizer solution 3 mL  3 mL Nebulization Q4H PRN Almyra Deforest, PA      . lisinopril (PRINIVIL,ZESTRIL) tablet 2.5 mg  2.5 mg Oral Daily Coralyn Mark, MD   2.5 mg at 07/26/14 1022  . nitroGLYCERIN (NITROSTAT) SL tablet 0.4 mg  0.4 mg  Sublingual Q5 Min x 3 PRN Almyra Deforest, PA      . ondansetron [facility]) injection 4 mg  4 mg Intravenous Q6H PRN Almyra Deforest, PA      . vitamin C (ASCORBIC ACID) tablet 1,000 mg  1,000 mg Oral Daily Almyra Deforest, PA   1,000 mg at 07/26/14 1007  . warfarin (COUMADIN) tablet 2 mg  2 mg Oral Once per day on Sun Mon Wed Thu Sat Carlena Bjornstad, MD   2 mg at 07/26/14 1749  . warfarin (COUMADIN) tablet 3 mg  3 mg Oral Once per day on Tue Fri Carlena Bjornstad, MD   3 mg at 07/25/14 1719  . Warfarin - Pharmacist Dosing Inpatient   Does not apply q1800 Anh P Pham, RPH        PE: General appearance: alert, cooperative and no distress Neck:  No JVD Lungs: clear to auscultation bilaterally Heart: regular rate and rhythm, S1, S2 normal, no murmur, click, rub or gallop Extremities: No LEE Pulses: 2+ and symmetric Skin: Warm and dry Neurologic: Grossly normal  Lab Results:  No results for input(s): WBC, HGB, HCT, PLT in the last 72 hours. BMET  Recent Labs  07/27/14 0245  NA 138  K 3.9  CL 96  CO2 27  GLUCOSE 292*  BUN 45*  CREATININE 1.69*  CALCIUM 8.9   PT/INR  Recent Labs  07/25/14 0343  LABPROT 26.1*  INR 2.37*    Assessment/Plan   Active Problems:   CAD, NATIVE VESSEL   Chronic atrial fibrillation   CKD (chronic kidney disease), stage III   Shortness of breath   Pacemaker   Hallucinations   Acute on chronic combined systolic and diastolic CHF (congestive heart failure)   Warfarin anticoagulation   Complete heart block  1. Acute on chronic combined systolic/ diastolic dysfunction Net fluids:  -1.8L/-6.8L.  Torsemide 40mg  PO BID.  SCr mildly elevated from prior.  On lisinopril 2.5mg .  BP controlled and stable.  Resume home torsemide dosing.   Check BMET on Wed.  2. CRI  Limits our ability to manage CHF  SCr mildly elevated  3. Permanent afib  Continue coumadin  Rates are controlled, coreg 12.5bid  4. Complete heart block  Normal leadless pacemaker function  5.  Hypertensive cardiovascular disease  BP stable and controlled  6. Deconditioning Ambulated without difficulties in the hall.  7.  Diabetes  Stable  DC home today.     LOS: 4 days    HAGER, BRYAN PA-C 07/27/2014 8:00 AM  I have seen, examined the patient, and reviewed the above assessment and plan.  Changes to above are made where necessary.  He has made considerable progress.  I worry about his long term prognosis given his advanced age and multiple active medically issues.  Will discharge with close outpatient follow-up.   Co Sign: Thompson Grayer, MD 07/27/2014 9:32 AM

## 2014-07-31 ENCOUNTER — Other Ambulatory Visit: Payer: Self-pay | Admitting: *Deleted

## 2014-07-31 DIAGNOSIS — N189 Chronic kidney disease, unspecified: Secondary | ICD-10-CM

## 2014-08-05 ENCOUNTER — Encounter: Payer: Self-pay | Admitting: Cardiology

## 2014-08-05 ENCOUNTER — Ambulatory Visit (INDEPENDENT_AMBULATORY_CARE_PROVIDER_SITE_OTHER): Payer: Medicare Other | Admitting: Cardiology

## 2014-08-05 VITALS — BP 103/58 | HR 63 | Ht 70.0 in | Wt 200.0 lb

## 2014-08-05 DIAGNOSIS — I2510 Atherosclerotic heart disease of native coronary artery without angina pectoris: Secondary | ICD-10-CM

## 2014-08-05 DIAGNOSIS — I5042 Chronic combined systolic (congestive) and diastolic (congestive) heart failure: Secondary | ICD-10-CM

## 2014-08-05 DIAGNOSIS — N183 Chronic kidney disease, stage 3 unspecified: Secondary | ICD-10-CM

## 2014-08-05 DIAGNOSIS — I482 Chronic atrial fibrillation, unspecified: Secondary | ICD-10-CM

## 2014-08-05 DIAGNOSIS — I5043 Acute on chronic combined systolic (congestive) and diastolic (congestive) heart failure: Secondary | ICD-10-CM

## 2014-08-05 DIAGNOSIS — I442 Atrioventricular block, complete: Secondary | ICD-10-CM

## 2014-08-05 NOTE — Assessment & Plan Note (Signed)
Clinically stable at this time. I reinforced fluid and sodium restriction guidelines. Also reinforced compliance with medical therapy. Continue current dose of Demadex, follow-up BMET with clinical visit in one month.

## 2014-08-05 NOTE — Assessment & Plan Note (Signed)
No active angina symptoms. No evidence of ACS during recent hospital stay.

## 2014-08-05 NOTE — Assessment & Plan Note (Signed)
Recent potassium normal, creatinine 1.6.

## 2014-08-05 NOTE — Progress Notes (Signed)
Reason for visit: Hospital follow-up  Clinical Summary [name] [name] is a medically complex 80 y.o.male seen earlier in December and referred directly for hospitalization with progressive shortness of breath and reported mental status changes. He was managed at [facility] and underwent IV diuresis with improvement in volume status. He ruled out for ACS. Pacemaker was interrogated, finding normal function. Medications were modified including a switch to Coreg and addition of low-dose ACE inhibitor. It was also recommended that benzodiazepines and other sedatives be used with caution, if at all.  Labwork from December 16 showed BUN 45, creatinine 1.6, potassium 4.3.   Follow-up echocardiogram reported mild LVH with LVEF approximately 30%, reduced compared to prior assessment.  Prior medications for control of his atrial fibrillation included Cardizem CD 360 mg daily and Lopressor 75 mg 3 times a day. Somewhat surprisingly, his heart rate looks well controlled today, rate is regular. He is only on Coreg at this time.  He is here with his wife and daughter. States that he feels much better, and they concur. Reports NYHA class II dyspnea, no palpitations or chest pain. Seems to be compliant with his medications. Weight has been stable at home, he did take one extrra dose of Demadex since hospital discharge.   Allergies  Allergen Reactions  . Colcrys [Colchicine]     Skin irritation.   . Contrast Media [Iodinated Diagnostic Agents] Other (See Comments)    Affected kidneys (yellow dye, pigment blue)  . Dabigatran Other (See Comments)    REACTION: Bleeding-low HGB  . Penicillins Swelling    Face swelling    Current Outpatient Prescriptions  Medication Sig Dispense Refill  . Ascorbic Acid (VITAMIN C) 1000 MG tablet Take 1,000 mg by mouth daily.    Marland Kitchen atorvastatin (LIPITOR) 10 MG tablet TAKE ONE TABLET BY MOUTH EVERY DAY 30 tablet 6  . carvedilol (COREG) 12.5 MG tablet Take 1 tablet  (12.5 mg total) by mouth 2 (two) times daily with a meal. 60 tablet 5  . clopidogrel (PLAVIX) 75 MG tablet TAKE ONE TABLET BY MOUTH EVERY DAY WITH BREAKFAST 30 tablet 6  . guaiFENesin (MUCINEX) 600 MG 12 hr tablet Take 600 mg by mouth 2 (two) times daily as needed for congestion.    Marland Kitchen ipratropium-albuterol (DUONEB) 0.5-2.5 (3) MG/3ML SOLN Take 3 mLs by nebulization every 4 (four) hours as needed.    Marland Kitchen LANTUS 100 UNIT/ML injection Inject 30 Units into the skin at bedtime.     Marland Kitchen lisinopril (PRINIVIL,ZESTRIL) 2.5 MG tablet Take 1 tablet (2.5 mg total) by mouth daily. 30 tablet 5  . nitroGLYCERIN (NITROSTAT) 0.4 MG SL tablet Place 1 tablet (0.4 mg total) under the tongue every 5 (five) minutes as needed for chest pain. 25 tablet 3  . torsemide (DEMADEX) 20 MG tablet Take 20-40 mg by mouth daily.     Marland Kitchen warfarin (COUMADIN) 2 MG tablet Take 2-3 mg by mouth See admin instructions. Managed by Dr. Nadara Mustard.  Take as directed by the coumadin clinic INR is 3.5 as of 07-22-14. Take 2mg  on Sunday, Mon, Wed, [place], and Sat. 3mg  on Tue and Friday     No current facility-administered medications for this visit.    Past Medical History  Diagnosis Date  . Chronic atrial fibrillation     a. Failed prior DCCV x 2; b. chronic coumadin.  . Essential hypertension, benign   . Mixed hyperlipidemia   . Chronic systolic heart failure     a. EF 40-45% by echo  2012.  . GI bleed     December 2011 - subsequent discontinuation of Pradaxa  . Right bundle branch block   . Coronary atherosclerosis of native coronary artery     a. s/p MI 1998;  b. Chronically occluded LAD, patent DES diagonal, subtotally occluded anomalous circumflex - failed prior PCI;  b. 04/2013 Cath/PCI: LM 95d (3.0x16 Promus Premier DES), LAD 00, D1 patent stent, LCX small 100, RCA min irregs, R->L Collats.  . Bradycardia     a. s/p SJM nanostim leadless pacemaker implanted by Dr Rayann Heman  . Ischemic cardiomyopathy     LVEF 45%  . Pacemaker 01/18/2013    STJ  Nanostim Leadless pacemaker implanted by Dr Rayann Heman  . History of pneumonia 1970's  . Type II diabetes mellitus   . Anemia   . History of blood transfusion 09/2010; 12/2012  . CVA (cerebral vascular accident) 09/2010    Suspected embolic while off anticoagulation.  . Arthritis   . CKD (chronic kidney disease) stage 3, GFR 30-59 ml/min     Past Surgical History  Procedure Laterality Date  . Pacemaker insertion  01/18/2013    STJ Nanostim Leadless pacemaker implanted by Dr Rayann Heman  . Lumbar disc surgery  2000's  . Cystectomy  1951  . Permanent pacemaker insertion N/A 01/18/2013    Procedure: PERMANENT PACEMAKER INSERTION;  Surgeon: Thompson Grayer, MD;  Location: [facility] CATH LAB;  Service: Cardiovascular;  Laterality: N/A;  . Left heart catheterization with coronary angiogram N/A 05/03/2013    Procedure: LEFT HEART CATHETERIZATION WITH CORONARY ANGIOGRAM;  Surgeon: Peter M Martinique, MD;  Location: [facility] CATH LAB;  Service: Cardiovascular;  Laterality: N/A;    Social History [name] [name] reports that he quit smoking about 45 years ago. His smoking use included Cigarettes. He has a 12 pack-year smoking history. He has never used smokeless tobacco. [name] [name] reports that he does not drink alcohol.  Review of Systems Complete review of systems negative except as otherwise outlined in the clinical summary and also the following. Heart if hearing. Stable appetite. No orthopnea or PND.  Physical Examination Filed Vitals:   08/05/14 1509  BP: 103/58  Pulse: 63   Filed Weights   08/05/14 1509  Weight: 200 lb (90.719 kg)    Overweight male, appears comfortable. HEENT: Conjunctiva and lids normal, oropharynx clear.  Neck: Supple, elevated JVP, no carotid bruits, no thyromegaly.  Lungs: Clear to auscultation, diminished. Cardiac Regular rate and rhythm, no S3, indistinct PMI.  Abdomen: Nontender, bowel sounds present, no guarding or rebound.  Extremities: Trace edema, distal pulses 1-2+.   Musculoskeletal: No kyphosis.  Skin: Warm and dry.  Neuropsychiatric: Alert and oriented x3, hard of hearing, affect appropriate.   Problem List and Plan   Chronic combined systolic and diastolic heart failure Clinically stable at this time. I reinforced fluid and sodium restriction guidelines. Also reinforced compliance with medical therapy. Continue current dose of Demadex, follow-up BMET with clinical visit in one month.  CKD (chronic kidney disease), stage III Recent potassium normal, creatinine 1.6.  Complete heart block St. Jude leadless pacemaker in place, followed by Dr. Rayann Heman. Function normal recently by interrogation.  Chronic atrial fibrillation Rate control has been an issue over time, previously on high-dose Cardizem CD and Lopressor. We will see how he does on Coreg following recent adjustments made during his stay at [facility]. Wife and daughter are aware to follow vital signs at home.  CAD, NATIVE VESSEL No active angina symptoms. No evidence of ACS  during recent hospital stay.    Satira Sark, M.D., F.A.C.C.

## 2014-08-05 NOTE — Patient Instructions (Signed)
Your physician recommends that you schedule a follow-up appointment in: 1 month. Your physician recommends that you continue on your current medications as directed. Please refer to the Current Medication list given to you today. Your physician recommends that you have lab work in 1 month just before your next visit to check your BMET.

## 2014-08-05 NOTE — Assessment & Plan Note (Signed)
St. Jude leadless pacemaker in place, followed by Dr. Rayann Heman. Function normal recently by interrogation.

## 2014-08-05 NOTE — Assessment & Plan Note (Signed)
Rate control has been an issue over time, previously on high-dose Cardizem CD and Lopressor. We will see how he does on Coreg following recent adjustments made during his stay at [facility]. Wife and daughter are aware to follow vital signs at home.

## 2014-08-18 ENCOUNTER — Telehealth: Payer: Self-pay | Admitting: Cardiology

## 2014-08-18 MED ORDER — CARVEDILOL 12.5 MG PO TABS: 18.7500 mg | ORAL_TABLET | Freq: Two times a day (BID) | ORAL | Status: DC

## 2014-08-18 NOTE — Telephone Encounter (Signed)
Noted. Probably related to paroxysmal atrial fibrillation. Medications were recently adjusted during hospitalization. Keep an eye on his heart rate, also go ahead and increase Coreg to 1-1/2 12.5 mg tablets BID. We may need to go higher.

## 2014-08-18 NOTE — Telephone Encounter (Signed)
Patient notified.  Will send new rx to pharm at patient request.

## 2014-08-18 NOTE — Telephone Encounter (Signed)
At 6:15  His HR was 100 At 6:23 was 146 At 6:26 was 74 At 6:28 was128 0 At 6:34 was 76  104/83 is his BP  Oxygen level 99 Patient states that he has a little odd feeling in his shoulders but other than that is feels ok. Said he was told to contact Dr  Domenic Polite if HR got above 100. Michela Pitcher that it has been doing this all weekend.

## 2014-08-19 ENCOUNTER — Ambulatory Visit: Payer: Medicare Other | Admitting: Cardiology

## 2014-08-20 ENCOUNTER — Other Ambulatory Visit: Payer: Self-pay | Admitting: *Deleted

## 2014-08-20 MED ORDER — NITROGLYCERIN 0.4 MG SL SUBL: 0.4000 mg | SUBLINGUAL_TABLET | SUBLINGUAL | Status: AC | PRN

## 2014-08-29 ENCOUNTER — Telehealth: Payer: Self-pay | Admitting: Cardiology

## 2014-08-29 MED ORDER — CARVEDILOL 25 MG PO TABS: 25.0000 mg | ORAL_TABLET | Freq: Two times a day (BID) | ORAL | Status: DC

## 2014-08-29 NOTE — Telephone Encounter (Signed)
Will forward to Dr. McDowell 

## 2014-08-29 NOTE — Telephone Encounter (Signed)
Pt made aware, updated medication list with notes to pharmacy. Pt verbalized understanding

## 2014-08-29 NOTE — Telephone Encounter (Signed)
Please see my last note. In the past he was on high-dose Cardizem CD and high-dose Lopressor to attain adequate heart rate control. Both of these medications were changed during his [facility] hospitalization. He is now on Coreg alone. I have already increased Coreg dose once. Let's try to increase Coreg to 25 mg twice daily. He should not have problems with symptomatic bradycardia because he has a pacemaker in place.

## 2014-08-29 NOTE — Telephone Encounter (Signed)
HR not stable.  Medication change has not seemed to help him he has stated.  Really concerned that his heart rate shoots up 120 and then sometimes in is between 40 to 60. [name] [name] states that he feels fine that he can not tell any difference when it fluctuates.

## 2014-09-01 DIAGNOSIS — I10 Essential (primary) hypertension: Secondary | ICD-10-CM | POA: Diagnosis not present

## 2014-09-01 DIAGNOSIS — N189 Chronic kidney disease, unspecified: Secondary | ICD-10-CM | POA: Diagnosis not present

## 2014-09-01 DIAGNOSIS — I63312 Cerebral infarction due to thrombosis of left middle cerebral artery: Secondary | ICD-10-CM | POA: Diagnosis not present

## 2014-09-01 DIAGNOSIS — Z1389 Encounter for screening for other disorder: Secondary | ICD-10-CM | POA: Diagnosis not present

## 2014-09-01 DIAGNOSIS — I482 Chronic atrial fibrillation: Secondary | ICD-10-CM | POA: Diagnosis not present

## 2014-09-01 DIAGNOSIS — M19041 Primary osteoarthritis, right hand: Secondary | ICD-10-CM | POA: Diagnosis not present

## 2014-09-08 DIAGNOSIS — I5043 Acute on chronic combined systolic (congestive) and diastolic (congestive) heart failure: Secondary | ICD-10-CM | POA: Diagnosis not present

## 2014-09-09 ENCOUNTER — Ambulatory Visit (INDEPENDENT_AMBULATORY_CARE_PROVIDER_SITE_OTHER): Payer: Medicare Other | Admitting: Cardiology

## 2014-09-09 ENCOUNTER — Encounter: Payer: Self-pay | Admitting: Cardiology

## 2014-09-09 VITALS — BP 96/63 | HR 120 | Ht 70.0 in | Wt 202.8 lb

## 2014-09-09 DIAGNOSIS — I482 Chronic atrial fibrillation, unspecified: Secondary | ICD-10-CM

## 2014-09-09 DIAGNOSIS — R001 Bradycardia, unspecified: Secondary | ICD-10-CM

## 2014-09-09 DIAGNOSIS — I5042 Chronic combined systolic (congestive) and diastolic (congestive) heart failure: Secondary | ICD-10-CM | POA: Diagnosis not present

## 2014-09-09 DIAGNOSIS — I2510 Atherosclerotic heart disease of native coronary artery without angina pectoris: Secondary | ICD-10-CM | POA: Diagnosis not present

## 2014-09-09 MED ORDER — DILTIAZEM HCL ER COATED BEADS 180 MG PO CP24: 180.0000 mg | ORAL_CAPSULE | Freq: Every day | ORAL | Status: DC

## 2014-09-09 NOTE — Assessment & Plan Note (Signed)
St. Jude leadless pacemaker in place, followed by Dr. Rayann Heman. Function normal recently by interrogation.

## 2014-09-09 NOTE — Patient Instructions (Signed)
Your physician recommends that you schedule a follow-up appointment in: 1 month. Your physician has recommended you make the following change in your medication:  Stop lisinopril. Start cardizem cd 180 mg daily. Continue all other medications the same. Your physician recommends that you have lab work in 1 month just before your next visit to check your BMET. You lab order was given to you today during your visit.

## 2014-09-09 NOTE — Assessment & Plan Note (Signed)
His weight is up a few pounds since December. He will continue current dose of Demadex, creatinine 1.8 at this time. We discussed salt and fluid restriction. Follow-up BMET for next visit in one month.

## 2014-09-09 NOTE — Assessment & Plan Note (Signed)
After discussion, our plan at this time is to continue Coreg at 25 mg twice daily, stop lisinopril, and place him back on Cardizem CD beginning at 180 mg daily. My hope is to try to further optimize heart rate control. This has generally been a difficult prospect over time. While it would be optimal to avoid calcium channel blockers with associated cardiopathy, I do not think that beta blocker alone is going to provide adequate heart rate control based on my experience in trying to manage him over time.

## 2014-09-09 NOTE — Progress Notes (Signed)
Reason for visit: Atrial fibrillation, CAD, cardiomyopathy  Clinical Summary Mr. Betzold is a medically complex 81 y.o.male last seen in December 2015. Since that time Coreg dose has been advanced related to elevated heart rates when in atrial fibrillation. Prior medications for control of his atrial fibrillation included Cardizem CD 360 mg daily and Lopressor 75 mg 3 times a day - these were stopped during his most recent hospitalization at [facility].   He is here with his wife and daughter. He has not had good heart rate control since I last saw him, heart rate is in the 120s today in atrial fibrillation. Despite this, he has fortunately felt reasonably well in terms of shortness of breath, he has had no feelings of chest pain or palpitations. States he gets up to go to the bathroom once at night time which is better than in the past. His weight is up 2 pounds from the last visit.  Recent lab work shows BUN 50, creatinine 1.8, potassium 4.7. He continues on Demadex.  Today I discussed with the patient and his family the need for providing better heart rate control as a means of trying to prevent worsening heart failure symptoms, fluid gain, and potentially even a further decrease in ejection fraction. It was certainly reasonable to switch him to Coreg in light of cardiomyopathy, although this is not going to be adequate by itself to provide heart rate control based on my experience with him over time. Today we discussed either switching him back to combination of metoprolol and Cardizem CD which had been effective previously, or leave him on Coreg and add back Cardizem CD. He chose the latter approach. With relatively low blood pressure, we will also be stopping his lisinopril, at least temporarily.  Allergies  Allergen Reactions  . Colcrys [Colchicine]     Skin irritation.   . Contrast Media [Iodinated Diagnostic Agents] Other (See Comments)    Affected kidneys (yellow dye, pigment blue)  .  Dabigatran Other (See Comments)    REACTION: Bleeding-low HGB  . Penicillins Swelling    Face swelling    Current Outpatient Prescriptions  Medication Sig Dispense Refill  . Ascorbic Acid (VITAMIN C) 1000 MG tablet Take 1,000 mg by mouth daily.    Marland Kitchen atorvastatin (LIPITOR) 10 MG tablet TAKE ONE TABLET BY MOUTH EVERY DAY 30 tablet 6  . carvedilol (COREG) 25 MG tablet Take 1 tablet (25 mg total) by mouth 2 (two) times daily. 180 tablet 1  . clopidogrel (PLAVIX) 75 MG tablet TAKE ONE TABLET BY MOUTH EVERY DAY WITH BREAKFAST 30 tablet 6  . guaiFENesin (MUCINEX) 600 MG 12 hr tablet Take 600 mg by mouth 2 (two) times daily as needed for congestion.    Marland Kitchen LANTUS 100 UNIT/ML injection Inject 30 Units into the skin at bedtime.     . nitroGLYCERIN (NITROSTAT) 0.4 MG SL tablet Place 1 tablet (0.4 mg total) under the tongue every 5 (five) minutes x 3 doses as needed for chest pain. 25 tablet 3  . torsemide (DEMADEX) 20 MG tablet Take 20-40 mg by mouth daily.     Marland Kitchen warfarin (COUMADIN) 2 MG tablet Take 2-3 mg by mouth See admin instructions. Managed by Dr. Nadara Mustard.  Take as directed by the coumadin clinic INR is 3.5 as of 07-22-14. Take 2mg  on Sunday, Mon, Wed, [place], and Sat. 3mg  on Tue and Friday    . diltiazem (CARDIZEM CD) 180 MG 24 hr capsule Take 1 capsule (180 mg total) by  mouth daily. 30 capsule 3   No current facility-administered medications for this visit.    Past Medical History  Diagnosis Date  . Chronic atrial fibrillation     a. Failed prior DCCV x 2; b. chronic coumadin.  . Essential hypertension, benign   . Mixed hyperlipidemia   . Chronic systolic heart failure     a. EF 40-45% by echo 2012.  . GI bleed     December 2011 - subsequent discontinuation of Pradaxa  . Right bundle branch block   . Coronary atherosclerosis of native coronary artery     a. s/p MI 1998;  b. Chronically occluded LAD, patent DES diagonal, subtotally occluded anomalous circumflex - failed prior PCI;  b. 04/2013  Cath/PCI: LM 95d (3.0x16 Promus Premier DES), LAD 00, D1 patent stent, LCX small 100, RCA min irregs, R->L Collats.  . Bradycardia     a. s/p SJM nanostim leadless pacemaker implanted by Dr Rayann Heman  . Ischemic cardiomyopathy     LVEF 45%  . Pacemaker 01/18/2013    STJ Nanostim Leadless pacemaker implanted by Dr Rayann Heman  . History of pneumonia 1970's  . Type II diabetes mellitus   . Anemia   . History of blood transfusion 09/2010; 12/2012  . CVA (cerebral vascular accident) 09/2010    Suspected embolic while off anticoagulation.  . Arthritis   . CKD (chronic kidney disease) stage 3, GFR 30-59 ml/min     Past Surgical History  Procedure Laterality Date  . Pacemaker insertion  01/18/2013    STJ Nanostim Leadless pacemaker implanted by Dr Rayann Heman  . Lumbar disc surgery  2000's  . Cystectomy  1951  . Permanent pacemaker insertion N/A 01/18/2013    Procedure: PERMANENT PACEMAKER INSERTION;  Surgeon: Thompson Grayer, MD;  Location: [facility] CATH LAB;  Service: Cardiovascular;  Laterality: N/A;  . Left heart catheterization with coronary angiogram N/A 05/03/2013    Procedure: LEFT HEART CATHETERIZATION WITH CORONARY ANGIOGRAM;  Surgeon: Peter M Martinique, MD;  Location: [facility] CATH LAB;  Service: Cardiovascular;  Laterality: N/A;    Family History  Problem Relation Age of Onset  . Diabetes Other   . Hypertension Other     Social History Mr. Risby reports that he quit smoking about 45 years ago. His smoking use included Cigarettes. He has a 12 pack-year smoking history. He has never used smokeless tobacco. Mr. Botto reports that he does not drink alcohol.  Review of Systems Complete review of systems negative except as otherwise outlined in the clinical summary and also the following. No falls. No distinct angina symptoms. No bleeding episodes.  Physical Examination Filed Vitals:   09/09/14 1523  BP: 96/63  Pulse: 120    Wt Readings from Last 3 Encounters:  09/09/14 202 lb 12.8 oz (91.989 kg)    08/05/14 200 lb (90.719 kg)  07/27/14 192 lb 1.6 oz (87.136 kg)   Overweight male, appears comfortable. HEENT: Conjunctiva and lids normal, oropharynx clear.  Neck: Supple, elevated JVP, no carotid bruits, no thyromegaly.  Lungs: Clear to auscultation, diminished. Cardiac Irregularly irregular, no S3, indistinct PMI.  Abdomen: Nontender, bowel sounds present, no guarding or rebound.  Extremities: 1+ edema, distal pulses 1-2+.  Musculoskeletal: No kyphosis.  Skin: Warm and dry.  Neuropsychiatric: Alert and oriented x3, hard of hearing, affect appropriate.   Problem List and Plan   Chronic atrial fibrillation After discussion, our plan at this time is to continue Coreg at 25 mg twice daily, stop lisinopril, and place him back on Cardizem  CD beginning at 180 mg daily. My hope is to try to further optimize heart rate control. This has generally been a difficult prospect over time. While it would be optimal to avoid calcium channel blockers with associated cardiopathy, I do not think that beta blocker alone is going to provide adequate heart rate control based on my experience in trying to manage him over time.   Chronic combined systolic and diastolic heart failure His weight is up a few pounds since December. He will continue current dose of Demadex, creatinine 1.8 at this time. We discussed salt and fluid restriction. Follow-up BMET for next visit in one month.   CAD, NATIVE VESSEL Known multivessel disease status post interventions over time, most recently with DES to the left main in September 2014 at which time diagonal stent was found to be patent, circumflex was occluded, and RCA showed minor irregularities with associated right to left collaterals. He is not reporting any active angina symptoms.   Symptomatic bradycardia St. Jude leadless pacemaker in place, followed by Dr. Rayann Heman. Function normal recently by interrogation.     Satira Sark, M.D., F.A.C.C.

## 2014-09-09 NOTE — Assessment & Plan Note (Signed)
Known multivessel disease status post interventions over time, most recently with DES to the left main in September 2014 at which time diagonal stent was found to be patent, circumflex was occluded, and RCA showed minor irregularities with associated right to left collaterals. He is not reporting any active angina symptoms.

## 2014-10-08 DIAGNOSIS — I5042 Chronic combined systolic (congestive) and diastolic (congestive) heart failure: Secondary | ICD-10-CM | POA: Diagnosis not present

## 2014-10-09 ENCOUNTER — Ambulatory Visit (INDEPENDENT_AMBULATORY_CARE_PROVIDER_SITE_OTHER): Payer: Medicare Other | Admitting: Cardiology

## 2014-10-09 ENCOUNTER — Encounter: Payer: Self-pay | Admitting: Cardiology

## 2014-10-09 VITALS — BP 122/70 | HR 97 | Ht 70.0 in | Wt 206.0 lb

## 2014-10-09 DIAGNOSIS — I130 Hypertensive heart and chronic kidney disease with heart failure and stage 1 through stage 4 chronic kidney disease, or unspecified chronic kidney disease: Secondary | ICD-10-CM | POA: Diagnosis not present

## 2014-10-09 DIAGNOSIS — I441 Atrioventricular block, second degree: Secondary | ICD-10-CM

## 2014-10-09 DIAGNOSIS — I5022 Chronic systolic (congestive) heart failure: Secondary | ICD-10-CM

## 2014-10-09 DIAGNOSIS — N183 Chronic kidney disease, stage 3 unspecified: Secondary | ICD-10-CM

## 2014-10-09 DIAGNOSIS — I2510 Atherosclerotic heart disease of native coronary artery without angina pectoris: Secondary | ICD-10-CM | POA: Diagnosis not present

## 2014-10-09 DIAGNOSIS — I480 Paroxysmal atrial fibrillation: Secondary | ICD-10-CM

## 2014-10-09 DIAGNOSIS — I509 Heart failure, unspecified: Secondary | ICD-10-CM

## 2014-10-09 MED ORDER — DILTIAZEM HCL ER COATED BEADS 240 MG PO CP24: 240.0000 mg | ORAL_CAPSULE | Freq: Every day | ORAL | Status: DC

## 2014-10-09 NOTE — Patient Instructions (Signed)
Your physician recommends that you schedule a follow-up appointment in: 6 weeks. Your physician has recommended you make the following change in your medication:  Increase your cardizem cd to 240 mg daily. Continue all other medications the same. Your physician recommends that you have lab work in 6 weeks just before your next visit to check your BMET.

## 2014-10-09 NOTE — Progress Notes (Signed)
Cardiology Office Note  Date: 10/09/2014   ID: [name], DOB [birth year], MRN [id number]  PCP: Rory Percy, MD  Primary Cardiologist: Rozann Lesches, MD   Chief Complaint  Patient presents with  . Atrial Fibrillation  . Coronary Artery Disease  . Cardiomyopathy    History of Present Illness: [name] [name] is an 81 y.o. male last seen in January. He presents for a routine visit today with his wife and daughter. Overall reports doing fairly well, no persistent increase in weight. He states that at home unclothed his weight is between 198 and 203 pounds fairly consistently, and he rarely has used an additional Demadex tablet. He does have dependent leg edema, although better in general. Also tells me that he has been sleeping better with less nocturia.  He reports compliance with his medications, which were reviewed and are outlined below. We added back Cardizem CD at 180 mg daily last time for better heart rate control. We discussed continuing to titrate with resting heart rate in the 90s today.  Follow-up lab work is reviewed below showing stable renal dysfunction and normal potassium.  He reports no bleeding problems on Plavix and Coumadin.   Past Medical History  Diagnosis Date  . Chronic atrial fibrillation     a. Failed prior DCCV x 2; b. chronic coumadin.  . Essential hypertension, benign   . Mixed hyperlipidemia   . Chronic systolic heart failure     a. EF 40-45% by echo 2012.  . GI bleed     December 2011 - subsequent discontinuation of Pradaxa  . Right bundle branch block   . Coronary atherosclerosis of native coronary artery     a. s/p MI 1998;  b. Chronically occluded LAD, patent DES diagonal, subtotally occluded anomalous circumflex - failed prior PCI;  b. 04/2013 Cath/PCI: LM 95d (3.0x16 Promus Premier DES), LAD 00, D1 patent stent, LCX small 100, RCA min irregs, R->L Collats.  . Bradycardia     a. s/p SJM nanostim leadless pacemaker implanted  by Dr Rayann Heman  . Ischemic cardiomyopathy     LVEF 45%  . Pacemaker 01/18/2013    STJ Nanostim Leadless pacemaker implanted by Dr Rayann Heman  . History of pneumonia 1970's  . Type II diabetes mellitus   . Anemia   . History of blood transfusion 09/2010; 12/2012  . CVA (cerebral vascular accident) 09/2010    Suspected embolic while off anticoagulation.  . Arthritis   . CKD (chronic kidney disease) stage 3, GFR 30-59 ml/min     Past Surgical History  Procedure Laterality Date  . Pacemaker insertion  01/18/2013    STJ Nanostim Leadless pacemaker implanted by Dr Rayann Heman  . Lumbar disc surgery  2000's  . Cystectomy  1951  . Permanent pacemaker insertion N/A 01/18/2013    Procedure: PERMANENT PACEMAKER INSERTION;  Surgeon: Thompson Grayer, MD;  Location: [facility] CATH LAB;  Service: Cardiovascular;  Laterality: N/A;  . Left heart catheterization with coronary angiogram N/A 05/03/2013    Procedure: LEFT HEART CATHETERIZATION WITH CORONARY ANGIOGRAM;  Surgeon: Peter M Martinique, MD;  Location: [facility] CATH LAB;  Service: Cardiovascular;  Laterality: N/A;    Current Outpatient Prescriptions  Medication Sig Dispense Refill  . Ascorbic Acid (VITAMIN C) 1000 MG tablet Take 1,000 mg by mouth daily.    Marland Kitchen atorvastatin (LIPITOR) 10 MG tablet TAKE ONE TABLET BY MOUTH EVERY DAY 30 tablet 6  . carvedilol (COREG) 25 MG tablet Take 1 tablet (25 mg total) by mouth 2 (  two) times daily. 180 tablet 1  . clopidogrel (PLAVIX) 75 MG tablet TAKE ONE TABLET BY MOUTH EVERY DAY WITH BREAKFAST 30 tablet 6  . guaiFENesin (MUCINEX) 600 MG 12 hr tablet Take 600 mg by mouth 2 (two) times daily as needed for congestion.    Marland Kitchen LANTUS 100 UNIT/ML injection Inject 30 Units into the skin at bedtime.     . nitroGLYCERIN (NITROSTAT) 0.4 MG SL tablet Place 1 tablet (0.4 mg total) under the tongue every 5 (five) minutes x 3 doses as needed for chest pain. 25 tablet 3  . torsemide (DEMADEX) 20 MG tablet Take 20-40 mg by mouth daily.     Marland Kitchen warfarin (COUMADIN) 2  MG tablet Take 2-3 mg by mouth See admin instructions. Managed by Dr. Nadara Mustard.  Take as directed by the coumadin clinic INR is 3.5 as of 07-22-14. Take 2mg  on Sunday, Mon, Wed, [place], and Sat. 3mg  on Tue and Friday    . diltiazem (CARDIZEM CD) 240 MG 24 hr capsule Take 1 capsule (240 mg total) by mouth daily. 30 capsule 6   No current facility-administered medications for this visit.    Allergies:  Colcrys; Contrast media; Dabigatran; Penicillins; and Pradaxa   Social History: The patient  reports that he quit smoking about 45 years ago. His smoking use included Cigarettes. He has a 12 pack-year smoking history. He has never used smokeless tobacco. He reports that he does not drink alcohol or use illicit drugs.   ROS:  Please see the history of present illness. Otherwise, complete review of systems is positive for dependent leg edema when he is up during the day. No angina symptoms, no orthopnea or PND.  All other systems are reviewed and negative.    Physical Exam: VS:  BP 122/70 mmHg  Pulse 97  Ht 5\' 10"  (1.778 m)  Wt 206 lb (93.441 kg)  BMI 29.56 kg/m2  SpO2 98%, BMI Body mass index is 29.56 kg/(m^2).  Wt Readings from Last 3 Encounters:  10/09/14 206 lb (93.441 kg)  09/09/14 202 lb 12.8 oz (91.989 kg)  08/05/14 200 lb (90.719 kg)     Overweight male, appears comfortable. HEENT: Conjunctiva and lids normal, oropharynx clear.  Neck: Supple, elevated JVP, no carotid bruits, no thyromegaly.  Lungs: Clear to auscultation, diminished. Cardiac Irregularly irregular, no S3, indistinct PMI.  Abdomen: Nontender, bowel sounds present, no guarding or rebound.  Extremities: 1+ edema, distal pulses 1-2+.  Musculoskeletal: No kyphosis.  Skin: Warm and dry.  Neuropsychiatric: Alert and oriented x3, hard of hearing, affect appropriate.   ECG: ECG is not ordered today.   Recent Labwork:  Lab work from 10/08/2014 showed BUN 39, creatinine 1.8, sodium 134, potassium 4.3.  Assessment  and Plan:  1. Chronic systolic heart failure, overall clinically stable. I reminded him to continue to weigh himself daily, restrict sodium to less than 2 g a day, use additional Demadex dose for increases in weight of 3 pounds in 24 hours. We will continue his present regimen, except as outlined below will be increasing his Cardizem CD further for better control of atrial fibrillation.  2. Atrial fibrillation, increased Cardizem CD to hurt and 40 mg daily and continue Coumadin.  3. CAD with ischemic cardiomyopathy, no active angina symptoms. He continues on Plavix with history of left main DES.  4. CKD, stage 3, recent creatinine stable at 1.8.  5. History of symptomatic bradycardia and second-degree heart block status post St. Jude leadless pacemaker, followed by Dr. Rayann Heman.  Current medicines are reviewed at length with the patient today.  The patient does not have concerns regarding medicines.    Orders Placed This Encounter  Procedures  . Basic metabolic panel    Disposition: FU with me in 6 weeks.   Signed, Satira Sark, MD, [facility] 10/09/2014 1:36 PM    [place] at [place], [place], [place] [address] Phone: [phone]; Fax: [phone]

## 2014-10-13 DIAGNOSIS — I482 Chronic atrial fibrillation: Secondary | ICD-10-CM | POA: Diagnosis not present

## 2014-11-17 DIAGNOSIS — I5043 Acute on chronic combined systolic (congestive) and diastolic (congestive) heart failure: Secondary | ICD-10-CM | POA: Diagnosis not present

## 2014-11-18 ENCOUNTER — Ambulatory Visit (INDEPENDENT_AMBULATORY_CARE_PROVIDER_SITE_OTHER): Payer: Medicare Other | Admitting: Cardiology

## 2014-11-18 ENCOUNTER — Encounter: Payer: Self-pay | Admitting: Cardiology

## 2014-11-18 VITALS — BP 130/80 | HR 60 | Ht 70.0 in | Wt 200.0 lb

## 2014-11-18 DIAGNOSIS — I2510 Atherosclerotic heart disease of native coronary artery without angina pectoris: Secondary | ICD-10-CM | POA: Diagnosis not present

## 2014-11-18 DIAGNOSIS — I480 Paroxysmal atrial fibrillation: Secondary | ICD-10-CM

## 2014-11-18 DIAGNOSIS — N183 Chronic kidney disease, stage 3 unspecified: Secondary | ICD-10-CM

## 2014-11-18 DIAGNOSIS — I5042 Chronic combined systolic (congestive) and diastolic (congestive) heart failure: Secondary | ICD-10-CM | POA: Diagnosis not present

## 2014-11-18 NOTE — Progress Notes (Signed)
Cardiology Office Note  Date: 11/18/2014   ID: [name] [name], DOB Aug 28, 1928, MRN [id number]  PCP: Rory Percy, MD  Primary Cardiologist: Rozann Lesches, MD   Chief Complaint  Patient presents with  . Coronary Artery Disease  . Cardiomyopathy  . Atrial Fibrillation    History of Present Illness: [name] [name] is a medically complex 81 y.o. male last seen in February. At the last visit Cardizem CD was increased to 240 mg daily for better heart rate control of atrial fibrillation. He is here with his daughter for a routine visit - she took notes throughout the visit which is her usual pattern, voiced no specific concerns or questions, and indicated that she thought Mr. Goyne was doing fairly well. His weight is stable, he reports no worsening leg edema over baseline, indicates that he has been compliant with his medications, occasionally taking an additional dose of Demadex. He stays as active as he can, complains that he has leg fatigue and lower back pain that limits his ambulation at times. These symptoms fluctuate.  He continues on anticoagulation, reports no spontaneous bleeding problems. Heart rate looks to be better controlled on the current regimen. He is due to see Dr. Rayann Heman for device interrogation in the near future.  Most recent follow-up lab work is outlined below.   Past Medical History  Diagnosis Date  . Chronic atrial fibrillation     a. Failed prior DCCV x 2; b. chronic coumadin.  . Essential hypertension, benign   . Mixed hyperlipidemia   . Chronic systolic heart failure     a. EF 40-45% by echo 2012.  . GI bleed     December 2011 - subsequent discontinuation of Pradaxa  . Right bundle branch block   . Coronary atherosclerosis of native coronary artery     a. s/p MI 1998;  b. Chronically occluded LAD, patent DES diagonal, subtotally occluded anomalous circumflex - failed prior PCI;  b. 04/2013 Cath/PCI: LM 95d (3.0x16 Promus Premier DES), LAD  00, D1 patent stent, LCX small 100, RCA min irregs, R->L Collats.  . Bradycardia     a. s/p SJM nanostim leadless pacemaker implanted by Dr Rayann Heman  . Ischemic cardiomyopathy     LVEF 45%  . Pacemaker 01/18/2013    STJ Nanostim Leadless pacemaker implanted by Dr Rayann Heman  . History of pneumonia 1970's  . Type II diabetes mellitus   . Anemia   . History of blood transfusion 09/2010; 12/2012  . CVA (cerebral vascular accident) 09/2010    Suspected embolic while off anticoagulation.  . Arthritis   . CKD (chronic kidney disease) stage 3, GFR 30-59 ml/min     Past Surgical History  Procedure Laterality Date  . Pacemaker insertion  01/18/2013    STJ Nanostim Leadless pacemaker implanted by Dr Rayann Heman  . Lumbar disc surgery  2000's  . Cystectomy  1951  . Permanent pacemaker insertion N/A 01/18/2013    Procedure: PERMANENT PACEMAKER INSERTION;  Surgeon: Thompson Grayer, MD;  Location: [facility] CATH LAB;  Service: Cardiovascular;  Laterality: N/A;  . Left heart catheterization with coronary angiogram N/A 05/03/2013    Procedure: LEFT HEART CATHETERIZATION WITH CORONARY ANGIOGRAM;  Surgeon: Peter M Martinique, MD;  Location: [facility] CATH LAB;  Service: Cardiovascular;  Laterality: N/A;    Current Outpatient Prescriptions  Medication Sig Dispense Refill  . Ascorbic Acid (VITAMIN C) 1000 MG tablet Take 1,000 mg by mouth daily.    Marland Kitchen atorvastatin (LIPITOR) 10 MG tablet TAKE ONE TABLET  BY MOUTH EVERY DAY 30 tablet 6  . carvedilol (COREG) 25 MG tablet Take 1 tablet (25 mg total) by mouth 2 (two) times daily. 180 tablet 1  . clopidogrel (PLAVIX) 75 MG tablet TAKE ONE TABLET BY MOUTH EVERY DAY WITH BREAKFAST 30 tablet 6  . diltiazem (CARDIZEM CD) 240 MG 24 hr capsule Take 1 capsule (240 mg total) by mouth daily. 30 capsule 6  . guaiFENesin (MUCINEX) 600 MG 12 hr tablet Take 600 mg by mouth 2 (two) times daily as needed for congestion.    Marland Kitchen LANTUS 100 UNIT/ML injection Inject 30 Units into the skin at bedtime.     .  nitroGLYCERIN (NITROSTAT) 0.4 MG SL tablet Place 1 tablet (0.4 mg total) under the tongue every 5 (five) minutes x 3 doses as needed for chest pain. 25 tablet 3  . torsemide (DEMADEX) 20 MG tablet Take 20-40 mg by mouth daily.     Marland Kitchen warfarin (COUMADIN) 2 MG tablet Take 2-3 mg by mouth See admin instructions. Managed by Dr. Nadara Mustard.  Take as directed by the coumadin clinic INR is 3.5 as of 07-22-14. Take 2mg  on Sunday, Mon, Wed, [place], and Sat. 3mg  on Tue and Friday     No current facility-administered medications for this visit.    Allergies:  Colcrys; Contrast media; Dabigatran; Penicillins; and Pradaxa   Social History: The patient  reports that he quit smoking about 45 years ago. His smoking use included Cigarettes. He has a 12 pack-year smoking history. He has never used smokeless tobacco. He reports that he does not drink alcohol or use illicit drugs.    ROS:  Please see the history of present illness. Otherwise, complete review of systems is positive for decreased hearing as before, otherwise as above.  All other systems are reviewed and negative.   Physical Exam: VS:  BP 130/80 mmHg  Pulse 60  Ht 5\' 10"  (1.778 m)  Wt 200 lb (90.719 kg)  BMI 28.70 kg/m2  SpO2 93%, BMI Body mass index is 28.7 kg/(m^2).  Wt Readings from Last 3 Encounters:  11/18/14 200 lb (90.719 kg)  10/09/14 206 lb (93.441 kg)  09/09/14 202 lb 12.8 oz (91.989 kg)     Overweight male, appears comfortable. HEENT: Conjunctiva and lids normal, oropharynx clear.  Neck: Supple, elevated JVP, no carotid bruits, no thyromegaly.  Lungs: Clear to auscultation, diminished. Cardiac Irregularly irregular, no S3, indistinct PMI.  Abdomen: Nontender, bowel sounds present, no guarding or rebound.  Extremities: 1+ edema, distal pulses 1-2+.  Musculoskeletal: No kyphosis.  Skin: Warm and dry.  Neuropsychiatric: Alert and oriented x3, hard of hearing, affect appropriate.   ECG: ECG is not ordered today.   Recent  Labwork: 07/23/2014: Hemoglobin 11.3*; Platelets 177; Pro B Natriuretic peptide (BNP) 6451.0* 07/27/2014: BUN 45*; Creatinine 1.69*; Potassium 3.9; Sodium 138     Component Value Date/Time   CHOL 72 07/24/2014 0225   TRIG 66 07/24/2014 0225   HDL 26* 07/24/2014 0225   CHOLHDL 2.8 07/24/2014 0225   VLDL 13 07/24/2014 0225   LDLCALC 33 07/24/2014 0225   10/08/2014 BUN 39, creatinine 1.8, sodium 134, potassium 4.3  11/17/2014 BUN 39, creatinine 1.7, potassium 4.9  Assessment and Plan:  1. Chronic systolic heart failure, remains clinically stable on current medical regimen without major adjustments since last visit. Continue daily weights, sodium restriction as before.  2. Paroxysmal to persistent atrial fibrillation, continue current heart rate control regimen, titrate as needed. He remains on Coumadin.  3. CAD with  ischemic cardiomyopathy status post previous left main DES. He continues on Plavix.  4. CKD, stage 3, recent creatinine relatively stable at 1.7.  5. Symptomatic bradycardia and second degree heart block status post St. Jude leadless pacemaker, keep pending visit with Dr. Rayann Heman.  Current medicines were reviewed with the patient today. No changes were made.   Orders Placed This Encounter  Procedures  . Basic metabolic panel    Disposition: FU with me in 2 months.   Signed, Satira Sark, MD, [facility] 11/18/2014 11:33 AM    [place] at [place], [place], [place] [address] Phone: [phone]; Fax: [phone]

## 2014-11-18 NOTE — Patient Instructions (Signed)
Your physician recommends that you schedule a follow-up appointment in: 2 months. Your physician recommends that you continue on your current medications as directed. Please refer to the Current Medication list given to you today. Your physician recommends that you have lab work in 2 months just before your visit to check your BMET.

## 2014-11-21 DIAGNOSIS — I482 Chronic atrial fibrillation: Secondary | ICD-10-CM | POA: Diagnosis not present

## 2014-11-24 ENCOUNTER — Ambulatory Visit (INDEPENDENT_AMBULATORY_CARE_PROVIDER_SITE_OTHER): Payer: Medicare Other | Admitting: Internal Medicine

## 2014-11-24 ENCOUNTER — Encounter: Payer: Self-pay | Admitting: Internal Medicine

## 2014-11-24 VITALS — BP 126/78 | HR 97 | Ht 69.5 in | Wt 209.8 lb

## 2014-11-24 DIAGNOSIS — I482 Chronic atrial fibrillation, unspecified: Secondary | ICD-10-CM

## 2014-11-24 DIAGNOSIS — I5042 Chronic combined systolic (congestive) and diastolic (congestive) heart failure: Secondary | ICD-10-CM | POA: Diagnosis not present

## 2014-11-24 DIAGNOSIS — I442 Atrioventricular block, complete: Secondary | ICD-10-CM | POA: Diagnosis not present

## 2014-11-24 DIAGNOSIS — I441 Atrioventricular block, second degree: Secondary | ICD-10-CM

## 2014-11-24 LAB — MDC_IDC_ENUM_SESS_TYPE_INCLINIC
Brady Statistic RV Percent Paced: 18 %
Implantable Pulse Generator Serial Number: 4248
Lead Channel Impedance Value: 460 Ohm
Lead Channel Pacing Threshold Amplitude: 0.25 V
Lead Channel Pacing Threshold Pulse Width: 0.4 ms
Lead Channel Setting Pacing Amplitude: 2.5 V
Lead Channel Setting Pacing Pulse Width: 0.4 ms
MDC IDC MSMT BATTERY VOLTAGE: 3.3 V
MDC IDC MSMT LEADCHNL RV SENSING INTR AMPL: 12 mV
MDC IDC SET LEADCHNL RV SENSING SENSITIVITY: 2 mV

## 2014-11-24 MED ORDER — DILTIAZEM HCL ER COATED BEADS 360 MG PO CP24: 360.0000 mg | ORAL_CAPSULE | Freq: Every day | ORAL | Status: DC

## 2014-11-24 NOTE — Patient Instructions (Signed)
Your physician wants you to follow-up in: 6 months with Chanetta Marshall, NP with research and 12 months with Dr Vallery Ridge will receive a reminder letter in the mail two months in advance. If you don't receive a letter, please call our office to schedule the follow-up appointment.  Your physician has recommended you make the following change in your medication:  1) Increase Diltiazem to 360 mg daily

## 2014-11-24 NOTE — Progress Notes (Signed)
Electrophysiology Office Note   Date:  11/24/2014   ID:  [name], DOB 1928/12/23, MRN [id number]  PCP:  Rory Percy, MD  Cardiologist:  Dr Domenic Polite Primary Electrophysiologist: Thompson Grayer, MD    Chief Complaint  Patient presents with  . Atrial Fibrillation     History of Present Illness: [name] [name] is a 80 y.o. male who presents today for electrophysiology evaluation.   He is doing well.  He recently bought and motorcycle and wants to ride.  He is primarily limited by back pain.  He doesn't sleep well and is tired during the day.  Today, he denies symptoms of palpitations, chest pain, shortness of breath, orthopnea, PND, lower extremity edema, claudication, dizziness, presyncope, syncope, bleeding, or neurologic sequela. The patient is tolerating medications without difficulties and is otherwise without complaint today.    Past Medical History  Diagnosis Date  . Chronic atrial fibrillation     a. Failed prior DCCV x 2; b. chronic coumadin.  . Essential hypertension, benign   . Mixed hyperlipidemia   . Chronic systolic heart failure     a. EF 40-45% by echo 2012.  . GI bleed     December 2011 - subsequent discontinuation of Pradaxa  . Right bundle branch block   . Coronary atherosclerosis of native coronary artery     a. s/p MI 1998;  b. Chronically occluded LAD, patent DES diagonal, subtotally occluded anomalous circumflex - failed prior PCI;  b. 04/2013 Cath/PCI: LM 95d (3.0x16 Promus Premier DES), LAD 00, D1 patent stent, LCX small 100, RCA min irregs, R->L Collats.  . Bradycardia     a. s/p SJM nanostim leadless pacemaker implanted by Dr Rayann Heman  . Ischemic cardiomyopathy     LVEF 45%  . Pacemaker 01/18/2013    STJ Nanostim Leadless pacemaker implanted by Dr Rayann Heman  . History of pneumonia 1970's  . Type II diabetes mellitus   . Anemia   . History of blood transfusion 09/2010; 12/2012  . CVA (cerebral vascular accident) 09/2010    Suspected  embolic while off anticoagulation.  . Arthritis   . CKD (chronic kidney disease) stage 3, GFR 30-59 ml/min    Past Surgical History  Procedure Laterality Date  . Pacemaker insertion  01/18/2013    STJ Nanostim Leadless pacemaker implanted by Dr Rayann Heman  . Lumbar disc surgery  2000's  . Cystectomy  1951  . Permanent pacemaker insertion N/A 01/18/2013    Procedure: PERMANENT PACEMAKER INSERTION;  Surgeon: Thompson Grayer, MD;  Location: [facility] CATH LAB;  Service: Cardiovascular;  Laterality: N/A;  . Left heart catheterization with coronary angiogram N/A 05/03/2013    Procedure: LEFT HEART CATHETERIZATION WITH CORONARY ANGIOGRAM;  Surgeon: Peter M Martinique, MD;  Location: [facility] CATH LAB;  Service: Cardiovascular;  Laterality: N/A;     Current Outpatient Prescriptions  Medication Sig Dispense Refill  . Ascorbic Acid (VITAMIN C) 1000 MG tablet Take 1,000 mg by mouth daily.    Marland Kitchen atorvastatin (LIPITOR) 10 MG tablet TAKE ONE TABLET BY MOUTH EVERY DAY 30 tablet 6  . carvedilol (COREG) 25 MG tablet Take 1 tablet (25 mg total) by mouth 2 (two) times daily. 180 tablet 1  . clopidogrel (PLAVIX) 75 MG tablet TAKE ONE TABLET BY MOUTH EVERY DAY WITH BREAKFAST 30 tablet 6  . diltiazem (CARDIZEM CD) 240 MG 24 hr capsule Take 1 capsule (240 mg total) by mouth daily. 30 capsule 6  . guaiFENesin (MUCINEX) 600 MG 12 hr tablet Take 600 mg  by mouth 2 (two) times daily as needed for congestion.    Marland Kitchen LANTUS 100 UNIT/ML injection Inject 30 Units into the skin at bedtime.     . nitroGLYCERIN (NITROSTAT) 0.4 MG SL tablet Place 1 tablet (0.4 mg total) under the tongue every 5 (five) minutes x 3 doses as needed for chest pain. 25 tablet 3  . torsemide (DEMADEX) 20 MG tablet Take 20-40 mg by mouth daily.     Marland Kitchen warfarin (COUMADIN) 2 MG tablet Take 2-3 mg by mouth See admin instructions. Managed by Dr. Nadara Mustard.  Take as directed by the coumadin clinic INR is 3.5 as of 07-22-14. Take 2mg  on Sunday, Mon, Wed, [place], and Sat. 3mg  on Tue and  Friday     No current facility-administered medications for this visit.    Allergies:   Colcrys; Contrast media; Dabigatran; Penicillins; and Pradaxa   Social History:  The patient  reports that he quit smoking about 45 years ago. His smoking use included Cigarettes. He has a 12 pack-year smoking history. He has never used smokeless tobacco. He reports that he does not drink alcohol or use illicit drugs.   Family History:  The patient's family history includes Diabetes in his other; Hypertension in his other.    ROS:  Please see the history of present illness.   All other systems are reviewed and negative.    PHYSICAL EXAM: VS:  BP 126/78 mmHg  Pulse 97  Ht 5' 9.5" (1.765 m)  Wt 209 lb 12.8 oz (95.165 kg)  BMI 30.55 kg/m2 , BMI Body mass index is 30.55 kg/(m^2). GEN: Well nourished, well developed, in no acute distress HEENT: normal Neck: no JVD, carotid bruits, or masses Cardiac: iRRR  Respiratory:  clear to auscultation bilaterally, normal work of breathing GI: soft, nontender, nondistended, + BS MS: no deformity or atrophy Skin: warm and dry  Neuro:  Strength and sensation are intact Psych: euthymic mood, full affect  EKG:  EKG is ordered today. The ekg ordered today shows afib with RBBB, V rate 97, nonspecific St/T changes  Device interrogation is reviewed today in detail.  See PaceArt for details.   Recent Labs: 07/23/2014: Hemoglobin 11.3*; Platelets 177; Pro B Natriuretic peptide (BNP) 6451.0* 07/27/2014: BUN 45*; Creatinine 1.69*; Potassium 3.9; Sodium 138    Lipid Panel     Component Value Date/Time   CHOL 72 07/24/2014 0225   TRIG 66 07/24/2014 0225   HDL 26* 07/24/2014 0225   CHOLHDL 2.8 07/24/2014 0225   VLDL 13 07/24/2014 0225   LDLCALC 33 07/24/2014 0225     Wt Readings from Last 3 Encounters:  11/24/14 209 lb 12.8 oz (95.165 kg)  11/18/14 200 lb (90.719 kg)  10/09/14 206 lb (93.441 kg)     ASSESSMENT AND PLAN:  1.  Permanent afib Rate  control approach in place, though PPM interogation reveals V rates are elevated at times. chads2vasc score is at least 8.  Continue long term anticoagulation Increase diltiazem to 360mg  daily  2. Ischemic CM/ chronic combined systolic and diastolic chf euvolemic today Stable Increase rate control  3. CAD No ischemic symptoms Consider stopping plavix.  I worry about antiplatelet + OAC therapy long term, especially with history of GI bleed.  Will defer to Dr Domenic Polite  4. Mobitz II second degree AV block Normal pacemaker function See Pace Art report No changes today   Current medicines are reviewed at length with the patient today.   The patient does not have concerns regarding his  medicines.  The following changes were made today:  none   Follow-up:  Return to see EP NP in 6 months, I will see in a year  Signed, Thompson Grayer, MD  11/24/2014 10:37 AM     [facility] [address] [place] [address] [phone] (office) [phone] (fax)

## 2014-11-25 DIAGNOSIS — E119 Type 2 diabetes mellitus without complications: Secondary | ICD-10-CM | POA: Diagnosis not present

## 2014-11-25 DIAGNOSIS — D509 Iron deficiency anemia, unspecified: Secondary | ICD-10-CM | POA: Diagnosis not present

## 2014-11-25 DIAGNOSIS — E780 Pure hypercholesterolemia: Secondary | ICD-10-CM | POA: Diagnosis not present

## 2014-11-25 DIAGNOSIS — N189 Chronic kidney disease, unspecified: Secondary | ICD-10-CM | POA: Diagnosis not present

## 2014-11-25 DIAGNOSIS — I10 Essential (primary) hypertension: Secondary | ICD-10-CM | POA: Diagnosis not present

## 2014-12-02 DIAGNOSIS — N189 Chronic kidney disease, unspecified: Secondary | ICD-10-CM | POA: Diagnosis not present

## 2014-12-02 DIAGNOSIS — E0821 Diabetes mellitus due to underlying condition with diabetic nephropathy: Secondary | ICD-10-CM | POA: Diagnosis not present

## 2014-12-02 DIAGNOSIS — I10 Essential (primary) hypertension: Secondary | ICD-10-CM | POA: Diagnosis not present

## 2014-12-02 DIAGNOSIS — Z23 Encounter for immunization: Secondary | ICD-10-CM | POA: Diagnosis not present

## 2014-12-02 DIAGNOSIS — I482 Chronic atrial fibrillation: Secondary | ICD-10-CM | POA: Diagnosis not present

## 2015-01-02 DIAGNOSIS — I482 Chronic atrial fibrillation: Secondary | ICD-10-CM | POA: Diagnosis not present

## 2015-01-26 ENCOUNTER — Encounter: Payer: Self-pay | Admitting: Cardiology

## 2015-01-26 ENCOUNTER — Ambulatory Visit (INDEPENDENT_AMBULATORY_CARE_PROVIDER_SITE_OTHER): Payer: Medicare Other | Admitting: Cardiology

## 2015-01-26 ENCOUNTER — Encounter: Payer: Self-pay | Admitting: *Deleted

## 2015-01-26 VITALS — BP 138/72 | HR 76 | Ht 69.0 in | Wt 196.0 lb

## 2015-01-26 DIAGNOSIS — R001 Bradycardia, unspecified: Secondary | ICD-10-CM | POA: Diagnosis not present

## 2015-01-26 DIAGNOSIS — I5022 Chronic systolic (congestive) heart failure: Secondary | ICD-10-CM

## 2015-01-26 DIAGNOSIS — I482 Chronic atrial fibrillation, unspecified: Secondary | ICD-10-CM

## 2015-01-26 DIAGNOSIS — Z950 Presence of cardiac pacemaker: Secondary | ICD-10-CM | POA: Diagnosis not present

## 2015-01-26 DIAGNOSIS — I2510 Atherosclerotic heart disease of native coronary artery without angina pectoris: Secondary | ICD-10-CM | POA: Diagnosis not present

## 2015-01-26 NOTE — Patient Instructions (Signed)
Your physician recommends that you continue on your current medications as directed. Please refer to the Current Medication list given to you today. Your physician recommends that you have lab work in 3 months just before your next visit in September 2016 to check your BMET. Your physician recommends that you schedule a follow-up appointment in: 3 months.

## 2015-01-26 NOTE — Progress Notes (Signed)
Cardiology Office Note  Date: 01/26/2015   ID: [name], DOB 11/24/1928, MRN [id number]  PCP: Rory Percy, MD  Primary Cardiologist: Rozann Lesches, MD   Chief Complaint  Patient presents with  . Cardiomyopathy  . Coronary Artery Disease  . Atrial Fibrillation    History of Present Illness: [name] [name] is a medically complex 81 y.o. male last seen in April. Interval follow-up with Dr. Rayann Heman noted. Cardizem CD was increased to 360 mg daily at that time for better heart rate control of atrial fibrillation. He is here today with his daughter. He tells me that he has been doing well, his main complaint is of lower back pain and leg discomfort when he walks or stands. He states that he had spine surgery years ago, and after discussion today he thinks that he may go back to see his orthopedist. The symptoms do not sound like claudication.  From a cardiac perspective, he reports that his heart rate at home is usually in the "70s" and his weight has also been relatively stable, actually down a few pounds by our scales. He does not endorse any progressive leg edema or orthopnea. Also no reported angina symptoms.  He did not have follow-up lab work that we ordered the last time, although states that he saw Dr. Nadara Mustard and had lab work done with him. We will request results.  He continues on combination of Plavix and Coumadin, the former related to a DES left main intervention, and the latter secondary to atrial fibrillation with high thromboembolic risk score. He has had no spontaneous bleeding problems, and we have discussed the rationale for continuing both medications long-term, as long as he tolerates this without significant bleeding complications. I have discussed this strategy with interventional cardiology (Dr. Burt Knack) who agrees, supporting long-term use of Plavix in this case.   Past Medical History  Diagnosis Date  . Chronic atrial fibrillation     a. Failed  prior DCCV x 2; b. chronic coumadin.  . Essential hypertension, benign   . Mixed hyperlipidemia   . Chronic systolic heart failure     a. EF 40-45% by echo 2012.  . GI bleed     December 2011 - subsequent discontinuation of Pradaxa  . Right bundle branch block   . Coronary atherosclerosis of native coronary artery     a. s/p MI 1998;  b. Chronically occluded LAD, patent DES diagonal, subtotally occluded anomalous circumflex - failed prior PCI;  b. 04/2013 Cath/PCI: LM 95d (3.0x16 Promus Premier DES), LAD 00, D1 patent stent, LCX small 100, RCA min irregs, R->L Collats.  . Bradycardia     a. s/p SJM nanostim leadless pacemaker implanted by Dr Rayann Heman  . Ischemic cardiomyopathy     LVEF 45%  . Pacemaker 01/18/2013    STJ Nanostim Leadless pacemaker implanted by Dr Rayann Heman  . History of pneumonia 1970's  . Type II diabetes mellitus   . Anemia   . History of blood transfusion 09/2010; 12/2012  . CVA (cerebral vascular accident) 09/2010    Suspected embolic while off anticoagulation.  . Arthritis   . CKD (chronic kidney disease) stage 3, GFR 30-59 ml/min     Past Surgical History  Procedure Laterality Date  . Pacemaker insertion  01/18/2013    STJ Nanostim Leadless pacemaker implanted by Dr Rayann Heman  . Lumbar disc surgery  2000's  . Cystectomy  1951  . Permanent pacemaker insertion N/A 01/18/2013    Procedure: PERMANENT PACEMAKER INSERTION;  Surgeon: Thompson Grayer, MD;  Location: [facility] CATH LAB;  Service: Cardiovascular;  Laterality: N/A;  . Left heart catheterization with coronary angiogram N/A 05/03/2013    Procedure: LEFT HEART CATHETERIZATION WITH CORONARY ANGIOGRAM;  Surgeon: Peter M Martinique, MD;  Location: [facility] CATH LAB;  Service: Cardiovascular;  Laterality: N/A;    Current Outpatient Prescriptions  Medication Sig Dispense Refill  . Ascorbic Acid (VITAMIN C) 1000 MG tablet Take 1,000 mg by mouth daily.    Marland Kitchen atorvastatin (LIPITOR) 10 MG tablet TAKE ONE TABLET BY MOUTH EVERY DAY 30 tablet 6  .  carvedilol (COREG) 25 MG tablet Take 1 tablet (25 mg total) by mouth 2 (two) times daily. 180 tablet 1  . clopidogrel (PLAVIX) 75 MG tablet TAKE ONE TABLET BY MOUTH EVERY DAY WITH BREAKFAST 30 tablet 6  . diltiazem (CARDIZEM CD) 360 MG 24 hr capsule Take 1 capsule (360 mg total) by mouth daily. 90 capsule 3  . guaiFENesin (MUCINEX) 600 MG 12 hr tablet Take 600 mg by mouth 2 (two) times daily as needed for congestion.    Marland Kitchen LANTUS 100 UNIT/ML injection Inject 30 Units into the skin at bedtime.     . nitroGLYCERIN (NITROSTAT) 0.4 MG SL tablet Place 1 tablet (0.4 mg total) under the tongue every 5 (five) minutes x 3 doses as needed for chest pain. 25 tablet 3  . torsemide (DEMADEX) 20 MG tablet Take 20-40 mg by mouth daily.     Marland Kitchen warfarin (COUMADIN) 2 MG tablet Take 2-3 mg by mouth See admin instructions. Managed by Dr. Nadara Mustard.  Take as directed by the coumadin clinic INR is 3.5 as of 07-22-14. Take 2mg  on Sunday, Mon, Wed, [place], and Sat. 3mg  on Tue and Friday     No current facility-administered medications for this visit.    Allergies:  Colcrys; Contrast media; Dabigatran; Penicillins; and Pradaxa   Social History: The patient  reports that he quit smoking about 45 years ago. His smoking use included Cigarettes. He has a 12 pack-year smoking history. He has never used smokeless tobacco. He reports that he does not drink alcohol or use illicit drugs.   ROS:  Please see the history of present illness. Otherwise, complete review of systems is positive for none.  All other systems are reviewed and negative.   Physical Exam: VS:  BP 138/72 mmHg  Pulse 76  Ht 5\' 9"  (1.753 m)  Wt 196 lb (88.905 kg)  BMI 28.93 kg/m2  SpO2 99%, BMI Body mass index is 28.93 kg/(m^2).  Wt Readings from Last 3 Encounters:  01/26/15 196 lb (88.905 kg)  11/24/14 209 lb 12.8 oz (95.165 kg)  11/18/14 200 lb (90.719 kg)     Overweight male, appears comfortable. HEENT: Conjunctiva and lids normal, oropharynx clear.   Neck: Supple, elevated JVP, no carotid bruits, no thyromegaly.  Lungs: Clear to auscultation, diminished. Cardiac Irregularly irregular, no S3, indistinct PMI.  Abdomen: Nontender, bowel sounds present, no guarding or rebound.  Extremities: 1+ edema, distal pulses 1-2+.  Musculoskeletal: No kyphosis.  Skin: Warm and dry.  Neuropsychiatric: Alert and oriented x3, hard of hearing, affect appropriate.   ECG: ECG is not ordered today.   Recent Labwork: 07/23/2014: Hemoglobin 11.3*; Platelets 177; Pro B Natriuretic peptide (BNP) 6451.0* 07/27/2014: BUN 45*; Creatinine, Ser 1.69*; Potassium 3.9; Sodium 138     Component Value Date/Time   CHOL 72 07/24/2014 0225   TRIG 66 07/24/2014 0225   HDL 26* 07/24/2014 0225   CHOLHDL 2.8 07/24/2014 0225  VLDL 13 07/24/2014 0225   LDLCALC 33 07/24/2014 0225    Assessment and Plan:  1. Chronic systolic heart failure, clinically stable at this time on current regimen. His weight is actually down a few pounds from last time. No change to current diuretic regimen. Requesting most recent lab work from Dr. Nadara Mustard.  2. Paroxysmal to persistent atrial fibrillation, medications modified by Dr. Rayann Heman most recently for rate control. His heart rate is in the 70s today in atrial fibrillation. He continues on Coumadin.  3. Symptomatic bradycardia with history of second-degree heart block, status post St. Jude leadmaker. Keep follow-up with Dr. Rayann Heman.  4. Ischemic heart disease status post previous DES to the diagonal and most recently DES to the left main. Patient continues on long-term Plavix status post left main DES intervention (discussed above) without any significant bleeding problems.  Current medicines were reviewed with the patient today.   Orders Placed This Encounter  Procedures  . Basic metabolic panel    Disposition: FU with me in 3 months.   Signed, Satira Sark, MD, [facility] 01/26/2015 11:35 AM    [place] at [place], [place],  [address] Phone: [phone]; Fax: [phone]

## 2015-02-19 DIAGNOSIS — I482 Chronic atrial fibrillation: Secondary | ICD-10-CM | POA: Diagnosis not present

## 2015-03-02 ENCOUNTER — Other Ambulatory Visit: Payer: Self-pay | Admitting: Cardiology

## 2015-04-02 DIAGNOSIS — I482 Chronic atrial fibrillation: Secondary | ICD-10-CM | POA: Diagnosis not present

## 2015-04-15 DIAGNOSIS — E11339 Type 2 diabetes mellitus with moderate nonproliferative diabetic retinopathy without macular edema: Secondary | ICD-10-CM | POA: Diagnosis not present

## 2015-04-30 DIAGNOSIS — I482 Chronic atrial fibrillation: Secondary | ICD-10-CM | POA: Diagnosis not present

## 2015-05-04 DIAGNOSIS — I482 Chronic atrial fibrillation: Secondary | ICD-10-CM | POA: Diagnosis not present

## 2015-05-05 ENCOUNTER — Ambulatory Visit (INDEPENDENT_AMBULATORY_CARE_PROVIDER_SITE_OTHER): Payer: Medicare Other | Admitting: Cardiology

## 2015-05-05 ENCOUNTER — Encounter: Payer: Self-pay | Admitting: Cardiology

## 2015-05-05 VITALS — BP 147/74 | HR 80 | Ht 69.0 in | Wt 208.1 lb

## 2015-05-05 DIAGNOSIS — I482 Chronic atrial fibrillation, unspecified: Secondary | ICD-10-CM

## 2015-05-05 DIAGNOSIS — I2510 Atherosclerotic heart disease of native coronary artery without angina pectoris: Secondary | ICD-10-CM | POA: Diagnosis not present

## 2015-05-05 DIAGNOSIS — I5022 Chronic systolic (congestive) heart failure: Secondary | ICD-10-CM | POA: Diagnosis not present

## 2015-05-05 DIAGNOSIS — R001 Bradycardia, unspecified: Secondary | ICD-10-CM

## 2015-05-05 NOTE — Progress Notes (Signed)
Cardiology Office Note  Date: 05/05/2015   ID: [name], DOB 01-18-1929, MRN [id number]  PCP: Rory Percy, MD  Primary Cardiologist: Rozann Lesches, MD   Chief Complaint  Patient presents with  . Coronary Artery Disease  . Atrial Fibrillation  . Cardiomyopathy    History of Present Illness: [name] [name] is a medically complex 81 y.o. male last seen in June. He is here today with his wife for a routine follow-up visit. She opens the conversation stating that they have been bickering more, he seems to "start arguments" about many things. His point of view seems to be that he feels like she is limiting his activities too much. She states that he may be having some trouble with his memory. He manages his medications at home.  We did review recent blood pressure and heart rate checks which looked good overall. He continues to press me about cutting back his Cardizem dose, although I did remind him that he has had recurring problems in the past with rapid ventricular response of his atrial fibrillation on lower doses, and I recommended that he not reduce the dose of diltiazem or Coreg at this time.  His weight is up compared to the last visit, he states that this is been the case at home as well. It does not sound like he has increased his Demadex as needed for weight change, although we have discussed this multiple times. I recommended that he take an additional Demadex tablet for the next few days to get his weight back into the mid to upper 190s.  He states that he will be seeing Dr. Nadara Mustard in about a week for follow-up.  He continues on combination of Plavix and Coumadin, the former related to a DES left main intervention, and the latter secondary to atrial fibrillation with high thromboembolic risk score. He has had no spontaneous bleeding problems, and we have discussed the rationale for continuing both medications long-term, as long as he tolerates this without  significant bleeding complications. I have discussed this strategy with interventional cardiology (Dr. Burt Knack) who agrees, supporting long-term use of Plavix in this case.   Past Medical History  Diagnosis Date  . Chronic atrial fibrillation     a. Failed prior DCCV x 2; b. chronic coumadin.  . Essential hypertension, benign   . Mixed hyperlipidemia   . Chronic systolic heart failure     a. EF 40-45% by echo 2012.  . GI bleed     December 2011 - subsequent discontinuation of Pradaxa  . Right bundle branch block   . Coronary atherosclerosis of native coronary artery     a. s/p MI 1998;  b. Chronically occluded LAD, patent DES diagonal, subtotally occluded anomalous circumflex - failed prior PCI;  b. 04/2013 Cath/PCI: LM 95d (3.0x16 Promus Premier DES), LAD 00, D1 patent stent, LCX small 100, RCA min irregs, R->L Collats.  . Bradycardia     a. s/p SJM nanostim leadless pacemaker implanted by Dr Rayann Heman  . Ischemic cardiomyopathy     LVEF 45%  . Pacemaker 01/18/2013    STJ Nanostim Leadless pacemaker implanted by Dr Rayann Heman  . History of pneumonia 1970's  . Type II diabetes mellitus   . Anemia   . History of blood transfusion 09/2010; 12/2012  . CVA (cerebral vascular accident) 09/2010    Suspected embolic while off anticoagulation.  . Arthritis   . CKD (chronic kidney disease) stage 3, GFR 30-59 ml/min     Past  Surgical History  Procedure Laterality Date  . Pacemaker insertion  01/18/2013    STJ Nanostim Leadless pacemaker implanted by Dr Rayann Heman  . Lumbar disc surgery  2000's  . Cystectomy  1951  . Permanent pacemaker insertion N/A 01/18/2013    Procedure: PERMANENT PACEMAKER INSERTION;  Surgeon: Thompson Grayer, MD;  Location: [facility] CATH LAB;  Service: Cardiovascular;  Laterality: N/A;  . Left heart catheterization with coronary angiogram N/A 05/03/2013    Procedure: LEFT HEART CATHETERIZATION WITH CORONARY ANGIOGRAM;  Surgeon: Peter M Martinique, MD;  Location: [facility] CATH LAB;  Service:  Cardiovascular;  Laterality: N/A;    Current Outpatient Prescriptions  Medication Sig Dispense Refill  . Ascorbic Acid (VITAMIN C) 1000 MG tablet Take 1,000 mg by mouth daily.    Marland Kitchen atorvastatin (LIPITOR) 10 MG tablet TAKE ONE TABLET BY MOUTH EVERY DAY 30 tablet 6  . carvedilol (COREG) 25 MG tablet TAKE ONE TABLET BY MOUTH TWICE DAILY. 180 tablet 1  . clopidogrel (PLAVIX) 75 MG tablet TAKE ONE TABLET BY MOUTH EVERY DAY WITH BREAKFAST 30 tablet 6  . diltiazem (CARDIZEM CD) 360 MG 24 hr capsule Take 1 capsule (360 mg total) by mouth daily. 90 capsule 3  . guaiFENesin (MUCINEX) 600 MG 12 hr tablet Take 600 mg by mouth 2 (two) times daily as needed for congestion.    Marland Kitchen LANTUS 100 UNIT/ML injection Inject 30 Units into the skin at bedtime.     . nitroGLYCERIN (NITROSTAT) 0.4 MG SL tablet Place 1 tablet (0.4 mg total) under the tongue every 5 (five) minutes x 3 doses as needed for chest pain. 25 tablet 3  . torsemide (DEMADEX) 20 MG tablet Take 20-40 mg by mouth daily.     Marland Kitchen warfarin (COUMADIN) 2 MG tablet Take 2-3 mg by mouth See admin instructions. Managed by Dr. Nadara Mustard.  Take as directed by the coumadin clinic INR is 3.5 as of 07-22-14. Take 2mg  on Sunday, Mon, Wed, [place], and Sat. 3mg  on Tue and Friday     No current facility-administered medications for this visit.    Allergies:  Colcrys; Contrast media; Dabigatran; Penicillins; and Pradaxa   Social History: The patient  reports that he quit smoking about 45 years ago. His smoking use included Cigarettes. He has a 12 pack-year smoking history. He has never used smokeless tobacco. He reports that he does not drink alcohol or use illicit drugs.   ROS:  Please see the history of present illness. Otherwise, complete review of systems is positive for decreased hearing.  Also complains of lower back pain and leg weakness when he stands or bends. All other systems are reviewed and negative.   Physical Exam: VS:  BP 147/74 mmHg  Pulse 80  Ht 5\' 9"   (1.753 m)  Wt 208 lb 1.9 oz (94.403 kg)  BMI 30.72 kg/m2  SpO2 100%, BMI Body mass index is 30.72 kg/(m^2).  Wt Readings from Last 3 Encounters:  05/05/15 208 lb 1.9 oz (94.403 kg)  01/26/15 196 lb (88.905 kg)  11/24/14 209 lb 12.8 oz (95.165 kg)     Overweight male, appears comfortable. HEENT: Conjunctiva and lids normal, oropharynx clear.  Neck: Supple, elevated JVP, no carotid bruits, no thyromegaly.  Lungs: Clear to auscultation, diminished. Cardiac Irregularly irregular, no S3, indistinct PMI.  Abdomen: Nontender, bowel sounds present, no guarding or rebound.  Extremities: 1+ edema, distal pulses 1-2+.  Musculoskeletal: No kyphosis.  Skin: Warm and dry.  Neuropsychiatric: Alert and oriented x3, hard of hearing, affect appropriate.  ECG: ECG is not ordered today.   Recent Labwork:  05/04/2015: BUN 29, creatinine 1.6 (down from 1.9), potassium 5.0, sodium 139  Assessment and Plan:  1. Chronic atrial fibrillation. He continues on Coumadin for stroke prophylaxis. Heart rate looks to be reasonably well controlled at this time on combination of Coreg and Cardizem CD. I recommended that he not reduce the dose of either these medications at this time. He has clearly had problems with rapid ventricular response on lower doses in the past.  2. History of symptomatic bradycardia and heart block, status post St. Jude lead ess pacemaker, followed and managed by Dr. Rayann Heman.  3. Multivessel CAD as outlined above, status post left main DES in September 2014. He continues on long-term Plavix.  4. Chronic combined heart failure. Weight is up compared to last visit. Recommend additional dose of Demadex for the next few days to get weight back into the mid to high 190s. Keep follow-up with Dr. Nadara Mustard in the interim. Follow-up BMET for next visit.  Current medicines were reviewed with the patient today.   Orders Placed This Encounter  Procedures  . Basic metabolic panel     Disposition: FU with me in 3 months.   Signed, Satira Sark, MD, [facility] 05/05/2015 12:30 PM    [place] at [place], [place], [place] [address] Phone: [phone]; Fax: [phone]

## 2015-05-05 NOTE — Patient Instructions (Addendum)
Your physician has recommended you make the following change in your medication:  Take an extra torsemide tablet for the next few days until your weight is in the upper 190's, then resume previous dose. Continue all other medications the same. Your physician recommends that you return for lab work in: 3 months just before your next visit to check your BMET. Lab order has been given to you today after your visit. Your physician recommends that you schedule a follow-up appointment in: 3 months. You will receive a reminder letter in the mail in about 1-2 months reminding you to call and schedule your appointment. If you don't receive this letter, please contact our office.

## 2015-05-06 DIAGNOSIS — B029 Zoster without complications: Secondary | ICD-10-CM | POA: Diagnosis not present

## 2015-05-07 ENCOUNTER — Encounter (INDEPENDENT_AMBULATORY_CARE_PROVIDER_SITE_OTHER): Payer: Self-pay | Admitting: Ophthalmology

## 2015-05-11 ENCOUNTER — Encounter (INDEPENDENT_AMBULATORY_CARE_PROVIDER_SITE_OTHER): Payer: Medicare Other | Admitting: Ophthalmology

## 2015-05-11 DIAGNOSIS — E11339 Type 2 diabetes mellitus with moderate nonproliferative diabetic retinopathy without macular edema: Secondary | ICD-10-CM

## 2015-05-11 DIAGNOSIS — E11319 Type 2 diabetes mellitus with unspecified diabetic retinopathy without macular edema: Secondary | ICD-10-CM

## 2015-05-11 DIAGNOSIS — I10 Essential (primary) hypertension: Secondary | ICD-10-CM | POA: Diagnosis not present

## 2015-05-11 DIAGNOSIS — H43813 Vitreous degeneration, bilateral: Secondary | ICD-10-CM | POA: Diagnosis not present

## 2015-05-11 DIAGNOSIS — H35033 Hypertensive retinopathy, bilateral: Secondary | ICD-10-CM | POA: Diagnosis not present

## 2015-05-11 DIAGNOSIS — H3531 Nonexudative age-related macular degeneration: Secondary | ICD-10-CM | POA: Diagnosis not present

## 2015-05-18 DIAGNOSIS — H2511 Age-related nuclear cataract, right eye: Secondary | ICD-10-CM | POA: Diagnosis not present

## 2015-05-18 DIAGNOSIS — Z961 Presence of intraocular lens: Secondary | ICD-10-CM | POA: Diagnosis not present

## 2015-05-18 DIAGNOSIS — E11319 Type 2 diabetes mellitus with unspecified diabetic retinopathy without macular edema: Secondary | ICD-10-CM | POA: Diagnosis not present

## 2015-05-18 DIAGNOSIS — E119 Type 2 diabetes mellitus without complications: Secondary | ICD-10-CM | POA: Diagnosis not present

## 2015-05-18 DIAGNOSIS — H25011 Cortical age-related cataract, right eye: Secondary | ICD-10-CM | POA: Diagnosis not present

## 2015-05-21 DIAGNOSIS — E7800 Pure hypercholesterolemia, unspecified: Secondary | ICD-10-CM | POA: Diagnosis not present

## 2015-05-21 DIAGNOSIS — N189 Chronic kidney disease, unspecified: Secondary | ICD-10-CM | POA: Diagnosis not present

## 2015-05-21 DIAGNOSIS — I10 Essential (primary) hypertension: Secondary | ICD-10-CM | POA: Diagnosis not present

## 2015-05-21 DIAGNOSIS — D509 Iron deficiency anemia, unspecified: Secondary | ICD-10-CM | POA: Diagnosis not present

## 2015-05-21 DIAGNOSIS — E0821 Diabetes mellitus due to underlying condition with diabetic nephropathy: Secondary | ICD-10-CM | POA: Diagnosis not present

## 2015-05-25 ENCOUNTER — Encounter: Payer: Medicare Other | Admitting: Nurse Practitioner

## 2015-05-25 ENCOUNTER — Encounter: Payer: Self-pay | Admitting: Nurse Practitioner

## 2015-05-25 ENCOUNTER — Ambulatory Visit (INDEPENDENT_AMBULATORY_CARE_PROVIDER_SITE_OTHER): Payer: Medicare Other | Admitting: Nurse Practitioner

## 2015-05-25 VITALS — BP 134/72 | Ht 69.5 in | Wt 206.0 lb

## 2015-05-25 DIAGNOSIS — I482 Chronic atrial fibrillation, unspecified: Secondary | ICD-10-CM

## 2015-05-25 DIAGNOSIS — I442 Atrioventricular block, complete: Secondary | ICD-10-CM | POA: Diagnosis not present

## 2015-05-25 DIAGNOSIS — I5043 Acute on chronic combined systolic (congestive) and diastolic (congestive) heart failure: Secondary | ICD-10-CM

## 2015-05-25 LAB — CUP PACEART INCLINIC DEVICE CHECK
Date Time Interrogation Session: 20161010112213
Lead Channel Setting Sensing Sensitivity: 2 mV
MDC IDC PG SERIAL: 4248
MDC IDC SET LEADCHNL RV PACING AMPLITUDE: 2.5 V
MDC IDC SET LEADCHNL RV PACING PULSEWIDTH: 0.4 ms

## 2015-05-25 NOTE — Patient Instructions (Signed)
Medication Instructions:   Your physician recommends that you continue on your current medications as directed. Please refer to the Current Medication list given to you today.   Labwork: NONE ORDER TODAY    Testing/Procedures: NONE ORDER TODAY    Follow-Up:  Your physician wants you to follow-up in:  IN  6  MONTHS WITH DR ALLRED  You will receive a reminder letter in the mail two months in advance. If you don't receive a letter, please call our office to schedule the follow-up appointment.      Any Other Special Instructions Will Be Listed Below (If Applicable).           

## 2015-05-25 NOTE — Progress Notes (Signed)
Electrophysiology Office Note Date: 05/25/2015  ID:  [name] [name], DOB [birth year], MRN [id number]  PCP: Rory Percy, MD Primary Cardiologist: Domenic Polite Electrophysiologist: Allred  CC: Pacemaker follow-up  [name] [name] is a 81 y.o. male seen today for Dr Rayann Heman.  He presents today for routine electrophysiology followup.  Since last being seen in our clinic, the patient reports doing very well.  He denies chest pain, palpitations, dyspnea, PND, orthopnea, nausea, vomiting, dizziness, syncope, edema, weight gain, or early satiety.  Device History: STJ leadless PPM implanted 2014 for Mobitz II heart block   Past Medical History  Diagnosis Date  . Chronic atrial fibrillation ([place])     a. Failed prior DCCV x 2; b. chronic coumadin.  . Essential hypertension, benign   . Mixed hyperlipidemia   . Chronic systolic heart failure (HCC)     a. EF 40-45% by echo 2012.  . GI bleed     December 2011 - subsequent discontinuation of Pradaxa  . Right bundle branch block   . Coronary atherosclerosis of native coronary artery     a. s/p MI 1998;  b. Chronically occluded LAD, patent DES diagonal, subtotally occluded anomalous circumflex - failed prior PCI;  b. 04/2013 Cath/PCI: LM 95d (3.0x16 Promus Premier DES), LAD 00, D1 patent stent, LCX small 100, RCA min irregs, R->L Collats.  . Bradycardia     a. s/p SJM nanostim leadless pacemaker implanted by Dr Rayann Heman  . Ischemic cardiomyopathy     LVEF 45%  . Pacemaker 01/18/2013    STJ Nanostim Leadless pacemaker implanted by Dr Rayann Heman  . History of pneumonia 1970's  . Type II diabetes mellitus ([place])   . Anemia   . History of blood transfusion 09/2010; 12/2012  . CVA (cerebral vascular accident) ([place]) 09/2010    Suspected embolic while off anticoagulation.  . Arthritis   . CKD (chronic kidney disease) stage 3, GFR 30-59 ml/min    Past Surgical History  Procedure Laterality Date  . Pacemaker insertion  01/18/2013    STJ Nanostim  Leadless pacemaker implanted by Dr Rayann Heman  . Lumbar disc surgery  2000's  . Cystectomy  1951  . Permanent pacemaker insertion N/A 01/18/2013    Procedure: PERMANENT PACEMAKER INSERTION;  Surgeon: Thompson Grayer, MD;  Location: [facility] CATH LAB;  Service: Cardiovascular;  Laterality: N/A;  . Left heart catheterization with coronary angiogram N/A 05/03/2013    Procedure: LEFT HEART CATHETERIZATION WITH CORONARY ANGIOGRAM;  Surgeon: Peter M Martinique, MD;  Location: [facility] CATH LAB;  Service: Cardiovascular;  Laterality: N/A;    Current Outpatient Prescriptions  Medication Sig Dispense Refill  . Ascorbic Acid (VITAMIN C) 1000 MG tablet Take 1,000 mg by mouth daily.    Marland Kitchen atorvastatin (LIPITOR) 10 MG tablet TAKE ONE TABLET BY MOUTH EVERY DAY 30 tablet 6  . carvedilol (COREG) 25 MG tablet TAKE ONE TABLET BY MOUTH TWICE DAILY. 180 tablet 1  . clopidogrel (PLAVIX) 75 MG tablet TAKE ONE TABLET BY MOUTH EVERY DAY WITH BREAKFAST 30 tablet 6  . diltiazem (CARDIZEM CD) 360 MG 24 hr capsule Take 1 capsule (360 mg total) by mouth daily. 90 capsule 3  . guaiFENesin (MUCINEX) 600 MG 12 hr tablet Take 600 mg by mouth 2 (two) times daily as needed for congestion.    Marland Kitchen LANTUS 100 UNIT/ML injection Inject 30 Units into the skin at bedtime.     . nitroGLYCERIN (NITROSTAT) 0.4 MG SL tablet Place 1 tablet (0.4 mg total) under the tongue every 5 (  five) minutes x 3 doses as needed for chest pain. 25 tablet 3  . torsemide (DEMADEX) 20 MG tablet Take 20-40 mg by mouth daily.     Marland Kitchen warfarin (COUMADIN) 2 MG tablet Take 2-3 mg by mouth See admin instructions. Managed by Dr. Nadara Mustard.  Take as directed by the coumadin clinic INR is 3.5 as of 07-22-14. Take 2mg  on Sunday, Mon, Wed, [place], and Sat. 3mg  on Tue and Friday     No current facility-administered medications for this visit.    Allergies:   Colcrys; Contrast media; Dabigatran; Penicillins; and Pradaxa   Social History: Social History   Social History  . Marital Status: Married     Spouse Name: [name] [name]  . Number of Children: N/A  . Years of Education: N/A   Occupational History  . RETIRED   .     Social History Main Topics  . Smoking status: Former Smoker -- 0.30 packs/day for 40 years    Types: Cigarettes    Quit date: 08/15/1969  . Smokeless tobacco: Never Used  . Alcohol Use: No  . Drug Use: No  . Sexual Activity: No   Other Topics Concern  . Not on file   Social History Narrative    Family History: Family History  Problem Relation Age of Onset  . Diabetes Other   . Hypertension Other      Review of Systems: All other systems reviewed and are otherwise negative except as noted above.   Physical Exam: VS:  BP 134/72 mmHg  Ht 5' 9.5" (1.765 m)  Wt 206 lb (93.441 kg)  BMI 29.99 kg/m2 , BMI Body mass index is 29.99 kg/(m^2).  GEN- The patient is well appearing, alert and oriented x 3 today.   HEENT: normocephalic, atraumatic; sclera clear, conjunctiva pink; hearing intact; oropharynx clear; neck supple, no JVP Lymph- no cervical lymphadenopathy Lungs- Clear to ausculation bilaterally, normal work of breathing.  No wheezes, rales, rhonchi Heart- Regular rate and rhythm, no murmurs, rubs or gallops, PMI not laterally displaced GI- soft, non-tender, non-distended, bowel sounds present, no hepatosplenomegaly Extremities- no clubbing, cyanosis, or edema; DP/PT/radial pulses 2+ bilaterally MS- no significant deformity or atrophy Skin- warm and dry, no rash or lesion; PPM pocket well healed Psych- euthymic mood, full affect Neuro- strength and sensation are intact  PPM Interrogation- reviewed in detail today,  See PACEART report  EKG:  EKG is ordered today. The ekg ordered today shows atrial fibrillation, rate 90, RBBB  Recent Labs: 07/23/2014: Hemoglobin 11.3*; Platelets 177; Pro B Natriuretic peptide (BNP) 6451.0* 07/27/2014: BUN 45*; Creatinine, Ser 1.69*; Potassium 3.9; Sodium 138   Wt Readings from Last 3 Encounters:  05/25/15 206 lb  (93.441 kg)  05/05/15 208 lb 1.9 oz (94.403 kg)  01/26/15 196 lb (88.905 kg)     Other studies Reviewed: Additional studies/ records that were reviewed today include: Dr Jackalyn Lombard office notes  Assessment and Plan:  1.  Permanent atrial fibrillation V rates stable by device interrogation today Continue Warfarin for CHADS2VASC of at least 8 Continue Diltiazem and Coreg   PCP following labs  2.   Mobitz II Normal PPM function No change today  3.  CAD No recent ischemic symptoms    Current medicines are reviewed at length with the patient today.   The patient does not have concerns regarding his medicines.  The following changes were made today:  none  Labs/ tests ordered today include:  Orders Placed This Encounter  Procedures  . EKG 12-Lead  Disposition:   Follow up with Dr Rayann Heman in 6 months   Signed, Chanetta Marshall, NP 05/25/2015 10:31 AM  [facility] [address] [place] [address] [phone] (office) [phone] (fax)

## 2015-05-26 DIAGNOSIS — H2511 Age-related nuclear cataract, right eye: Secondary | ICD-10-CM | POA: Diagnosis not present

## 2015-05-28 DIAGNOSIS — N189 Chronic kidney disease, unspecified: Secondary | ICD-10-CM | POA: Diagnosis not present

## 2015-05-28 DIAGNOSIS — E1165 Type 2 diabetes mellitus with hyperglycemia: Secondary | ICD-10-CM | POA: Diagnosis not present

## 2015-05-28 DIAGNOSIS — E0821 Diabetes mellitus due to underlying condition with diabetic nephropathy: Secondary | ICD-10-CM | POA: Diagnosis not present

## 2015-05-28 DIAGNOSIS — I481 Persistent atrial fibrillation: Secondary | ICD-10-CM | POA: Diagnosis not present

## 2015-05-28 DIAGNOSIS — I10 Essential (primary) hypertension: Secondary | ICD-10-CM | POA: Diagnosis not present

## 2015-05-28 DIAGNOSIS — I501 Left ventricular failure: Secondary | ICD-10-CM | POA: Diagnosis not present

## 2015-06-01 ENCOUNTER — Encounter: Payer: Medicare Other | Admitting: Nurse Practitioner

## 2015-06-05 ENCOUNTER — Encounter: Payer: Self-pay | Admitting: Internal Medicine

## 2015-06-09 ENCOUNTER — Other Ambulatory Visit: Payer: Self-pay | Admitting: *Deleted

## 2015-06-09 MED ORDER — CARVEDILOL 25 MG PO TABS: 25.0000 mg | ORAL_TABLET | Freq: Two times a day (BID) | ORAL | Status: DC

## 2015-06-11 DIAGNOSIS — I482 Chronic atrial fibrillation: Secondary | ICD-10-CM | POA: Diagnosis not present

## 2015-06-19 ENCOUNTER — Telehealth: Payer: Self-pay | Admitting: *Deleted

## 2015-06-19 NOTE — Telephone Encounter (Signed)
I spoke to patient about potential battery malfunction for Leadless Study due to a dry battery cell. I made patient aware to call Dr.Allred's office if he has any symptoms of lightheadness, dizziness or fainting or call 911 if emergency. Patient verbalized understanding.

## 2015-06-24 DIAGNOSIS — Z7901 Long term (current) use of anticoagulants: Secondary | ICD-10-CM | POA: Diagnosis not present

## 2015-06-24 DIAGNOSIS — Z79899 Other long term (current) drug therapy: Secondary | ICD-10-CM | POA: Diagnosis not present

## 2015-06-24 DIAGNOSIS — Z794 Long term (current) use of insulin: Secondary | ICD-10-CM | POA: Diagnosis not present

## 2015-06-24 DIAGNOSIS — Z950 Presence of cardiac pacemaker: Secondary | ICD-10-CM | POA: Diagnosis not present

## 2015-06-24 DIAGNOSIS — M71561 Other bursitis, not elsewhere classified, right knee: Secondary | ICD-10-CM | POA: Diagnosis not present

## 2015-06-24 DIAGNOSIS — Z7902 Long term (current) use of antithrombotics/antiplatelets: Secondary | ICD-10-CM | POA: Diagnosis not present

## 2015-06-24 DIAGNOSIS — M7041 Prepatellar bursitis, right knee: Secondary | ICD-10-CM | POA: Diagnosis not present

## 2015-06-24 DIAGNOSIS — M25461 Effusion, right knee: Secondary | ICD-10-CM | POA: Diagnosis not present

## 2015-06-24 DIAGNOSIS — I5040 Unspecified combined systolic (congestive) and diastolic (congestive) heart failure: Secondary | ICD-10-CM | POA: Diagnosis not present

## 2015-06-24 DIAGNOSIS — I130 Hypertensive heart and chronic kidney disease with heart failure and stage 1 through stage 4 chronic kidney disease, or unspecified chronic kidney disease: Secondary | ICD-10-CM | POA: Diagnosis not present

## 2015-06-24 DIAGNOSIS — I252 Old myocardial infarction: Secondary | ICD-10-CM | POA: Diagnosis not present

## 2015-06-24 DIAGNOSIS — E119 Type 2 diabetes mellitus without complications: Secondary | ICD-10-CM | POA: Diagnosis not present

## 2015-06-24 DIAGNOSIS — N183 Chronic kidney disease, stage 3 (moderate): Secondary | ICD-10-CM | POA: Diagnosis not present

## 2015-06-24 DIAGNOSIS — Z955 Presence of coronary angioplasty implant and graft: Secondary | ICD-10-CM | POA: Diagnosis not present

## 2015-06-24 DIAGNOSIS — M25561 Pain in right knee: Secondary | ICD-10-CM | POA: Diagnosis not present

## 2015-07-23 DIAGNOSIS — I481 Persistent atrial fibrillation: Secondary | ICD-10-CM | POA: Diagnosis not present

## 2015-07-31 DIAGNOSIS — J309 Allergic rhinitis, unspecified: Secondary | ICD-10-CM | POA: Diagnosis not present

## 2015-07-31 DIAGNOSIS — R0981 Nasal congestion: Secondary | ICD-10-CM | POA: Diagnosis not present

## 2015-08-06 DIAGNOSIS — I63312 Cerebral infarction due to thrombosis of left middle cerebral artery: Secondary | ICD-10-CM | POA: Diagnosis not present

## 2015-08-06 DIAGNOSIS — I482 Chronic atrial fibrillation: Secondary | ICD-10-CM | POA: Diagnosis not present

## 2015-08-10 DIAGNOSIS — I481 Persistent atrial fibrillation: Secondary | ICD-10-CM | POA: Diagnosis not present

## 2015-08-10 DIAGNOSIS — E1165 Type 2 diabetes mellitus with hyperglycemia: Secondary | ICD-10-CM | POA: Diagnosis not present

## 2015-08-10 DIAGNOSIS — N189 Chronic kidney disease, unspecified: Secondary | ICD-10-CM | POA: Diagnosis not present

## 2015-08-10 DIAGNOSIS — I501 Left ventricular failure: Secondary | ICD-10-CM | POA: Diagnosis not present

## 2015-08-10 DIAGNOSIS — I10 Essential (primary) hypertension: Secondary | ICD-10-CM | POA: Diagnosis not present

## 2015-08-13 ENCOUNTER — Other Ambulatory Visit: Payer: Self-pay | Admitting: *Deleted

## 2015-08-13 ENCOUNTER — Encounter: Payer: Self-pay | Admitting: Cardiology

## 2015-08-13 ENCOUNTER — Ambulatory Visit (INDEPENDENT_AMBULATORY_CARE_PROVIDER_SITE_OTHER): Payer: Medicare Other | Admitting: Cardiology

## 2015-08-13 VITALS — BP 128/64 | HR 74 | Ht 69.0 in | Wt 205.0 lb

## 2015-08-13 DIAGNOSIS — I2510 Atherosclerotic heart disease of native coronary artery without angina pectoris: Secondary | ICD-10-CM

## 2015-08-13 DIAGNOSIS — I10 Essential (primary) hypertension: Secondary | ICD-10-CM | POA: Diagnosis not present

## 2015-08-13 DIAGNOSIS — I5043 Acute on chronic combined systolic (congestive) and diastolic (congestive) heart failure: Secondary | ICD-10-CM | POA: Diagnosis not present

## 2015-08-13 DIAGNOSIS — N183 Chronic kidney disease, stage 3 unspecified: Secondary | ICD-10-CM

## 2015-08-13 DIAGNOSIS — I482 Chronic atrial fibrillation, unspecified: Secondary | ICD-10-CM

## 2015-08-13 DIAGNOSIS — Z950 Presence of cardiac pacemaker: Secondary | ICD-10-CM

## 2015-08-13 MED ORDER — TORSEMIDE 20 MG PO TABS: ORAL_TABLET | ORAL | Status: DC

## 2015-08-13 MED ORDER — TORSEMIDE 20 MG PO TABS: 40.0000 mg | ORAL_TABLET | Freq: Every day | ORAL | Status: DC

## 2015-08-13 NOTE — Progress Notes (Signed)
Cardiology Office Note  Date: 08/13/2015   ID: [name] [name], DOB 06/05/1929, MRN [id number]  PCP: Rory Percy, MD  Primary Cardiologist: Rozann Lesches, MD   Chief Complaint  Patient presents with  . Coronary Artery Disease  . Atrial Fibrillation  . Cardiomyopathy    History of Present Illness: [name] [name] is a medically complex 81 y.o. male last seen in September. He is here today with his wife and daughter for a follow-up visit. He complains of having an intermittent but relatively persistent cough over the last month. He was diagnosed with allergic rhinitis by his PCP, but has had persistent symptoms and was recently referred for lab work and a chest x-ray as outlined below. He has had no fevers or chills. He did not bring in his home vitals signs or weight checks, but tells me that his weight had been down in the high 190s approximately one month ago and then suddenly increased by at least 10 pounds. He states that he has been taking Demadex at 40 mg daily for the last month, his weight has not come down to baseline as yet. He was just recently instructed by his PCP to take an additional 20 mg Demadex daily, and he has done this for the last 2 days. By our scales his weight is down only a few pounds since September. Still not back to baseline weights.  At the last visit his weight was up and I recommended temporary increase in Demadex as well. He reports compliance with his medications, but it is very difficult to be certain about this, he does admit that sometimes he "misses a dose." Fortunately, blood pressure and heart rate are adequately controlled today.  Today are reviewed his medications in detail and I recommended that he take Demadex at a total of 60 mg daily until his weight comes down to 195 pounds. At that point he will decrease Demadex to a total of 40 mg daily. He needs to continue to weigh himself daily.  He continues to follow with Dr. Rayann Heman for  device interrogation. He is not aware of any palpitations. He does not report any spontaneous bleeding problems.  Past Medical History  Diagnosis Date  . Chronic atrial fibrillation ([place])     a. Failed prior DCCV x 2; b. chronic coumadin.  . Essential hypertension, benign   . Mixed hyperlipidemia   . Chronic systolic heart failure (HCC)     a. EF 40-45% by echo 2012.  . GI bleed     December 2011 - subsequent discontinuation of Pradaxa  . Right bundle branch block   . Coronary atherosclerosis of native coronary artery     a. s/p MI 1998;  b. Chronically occluded LAD, patent DES diagonal, subtotally occluded anomalous circumflex - failed prior PCI;  b. 04/2013 Cath/PCI: LM 95d (3.0x16 Promus Premier DES), LAD 00, D1 patent stent, LCX small 100, RCA min irregs, R->L Collats.  . Bradycardia     a. s/p SJM nanostim leadless pacemaker implanted by Dr Rayann Heman  . Ischemic cardiomyopathy     LVEF 45%  . Pacemaker 01/18/2013    STJ Nanostim Leadless pacemaker implanted by Dr Rayann Heman  . History of pneumonia 1970's  . Type II diabetes mellitus ([place])   . Anemia   . History of blood transfusion 09/2010; 12/2012  . CVA (cerebral vascular accident) ([place]) 09/2010    Suspected embolic while off anticoagulation.  . Arthritis   . CKD (chronic kidney disease) stage 3,  GFR 30-59 ml/min     Past Surgical History  Procedure Laterality Date  . Pacemaker insertion  01/18/2013    STJ Nanostim Leadless pacemaker implanted by Dr Rayann Heman  . Lumbar disc surgery  2000's  . Cystectomy  1951  . Permanent pacemaker insertion N/A 01/18/2013    Procedure: PERMANENT PACEMAKER INSERTION;  Surgeon: Thompson Grayer, MD;  Location: [facility] CATH LAB;  Service: Cardiovascular;  Laterality: N/A;  . Left heart catheterization with coronary angiogram N/A 05/03/2013    Procedure: LEFT HEART CATHETERIZATION WITH CORONARY ANGIOGRAM;  Surgeon: Peter M Martinique, MD;  Location: [facility] CATH LAB;  Service: Cardiovascular;  Laterality: N/A;    Current  Outpatient Prescriptions  Medication Sig Dispense Refill  . Ascorbic Acid (VITAMIN C) 1000 MG tablet Take 1,000 mg by mouth daily.    Marland Kitchen atorvastatin (LIPITOR) 10 MG tablet TAKE ONE TABLET BY MOUTH EVERY DAY 30 tablet 6  . carvedilol (COREG) 25 MG tablet Take 1 tablet (25 mg total) by mouth 2 (two) times daily. 180 tablet 3  . clopidogrel (PLAVIX) 75 MG tablet TAKE ONE TABLET BY MOUTH EVERY DAY WITH BREAKFAST 30 tablet 6  . COD LIVER OIL PO Take by mouth.    . diltiazem (CARDIZEM CD) 360 MG 24 hr capsule Take 1 capsule (360 mg total) by mouth daily. 90 capsule 3  . guaiFENesin (MUCINEX) 600 MG 12 hr tablet Take 600 mg by mouth 2 (two) times daily as needed for congestion.    Marland Kitchen LANTUS 100 UNIT/ML injection Inject 30 Units into the skin at bedtime.     . Multiple Vitamins-Minerals (PRESERVISION AREDS) CAPS Take by mouth.    . nitroGLYCERIN (NITROSTAT) 0.4 MG SL tablet Place 1 tablet (0.4 mg total) under the tongue every 5 (five) minutes x 3 doses as needed for chest pain. 25 tablet 3  . torsemide (DEMADEX) 20 MG tablet Take 2 tablets (40 mg total) by mouth daily.    Marland Kitchen warfarin (COUMADIN) 2 MG tablet Take 2-3 mg by mouth See admin instructions. Managed by Dr. Nadara [name].  Take as directed by the coumadin clinic INR is 3.5 as of 07-22-14. Take 2mg  on Sunday, Mon, Wed, [place], and Sat. 3mg  on Tue and Friday     No current facility-administered medications for this visit.   Allergies:  Colcrys; Contrast media; Dabigatran; Penicillins; and Pradaxa   Social History: The patient  reports that he quit smoking about 46 years ago. His smoking use included Cigarettes. He has a 12 pack-year smoking history. He has never used smokeless tobacco. He reports that he does not drink alcohol or use illicit drugs.   ROS:  Please see the history of present illness. Otherwise, complete review of systems is positive for decreased hearing, chronic fatigue, intermittent leg swelling.  All other systems are reviewed and negative.     Physical Exam: VS:  BP 128/64 mmHg  Pulse 74  Ht 5\' 9"  (1.753 m)  Wt 205 lb (92.987 kg)  BMI 30.26 kg/m2  SpO2 98%, BMI Body mass index is 30.26 kg/(m^2).  Wt Readings from Last 3 Encounters:  08/13/15 205 lb (92.987 kg)  05/25/15 206 lb (93.441 kg)  05/05/15 208 lb 1.9 oz (94.403 kg)    Overweight male, appears comfortable. HEENT: Conjunctiva and lids normal, oropharynx clear.  Neck: Supple, elevated JVP, no carotid bruits, no thyromegaly.  Lungs: Clear to auscultation, diminished. Cardiac Irregularly irregular, no S3, indistinct PMI.  Abdomen: Nontender, bowel sounds present, no guarding or rebound.  Extremities: 2+ leg  edema, right worse than left, distal pulses 1-2+.  Musculoskeletal: No kyphosis.  Skin: Warm and dry.  Neuropsychiatric: Alert and oriented x3, hard of hearing, affect appropriate.  ECG: Tracing from 05/25/2015 showed atrial fibrillation with right bundle branch block and left anterior fascicular block.  Recent Labwork:  September 2016: BUN 29, creatinine 1.6, potassium 5.0 December 2016: BUN 27, creatinine 1.2, AST 15, ALT 14, potassium 4.6, hemoglobin 11.4, platelets 164  Other Studies Reviewed Today:  Chest x-ray ([place]) from 08/10/2015 reports cardiomegaly with vascular congestion. No pulmonary edema or infiltrates.  Assessment and Plan:  1. Weight gain with evidence of volume overload and acute on chronic combined heart failure. We discussed his medications in detail and I reviewed his recent lab work and chest x-ray results. Plan at this time is to increase Demadex to 60 mg daily until his weight comes back down to 195. At that point he will reduce Demadex to 40 mg daily. I stressed the importance of sodium and fluid restriction, also maintaining a check on daily weights. I also asked him to make sure that he brings in his records from home so that I can get a better idea about his weight trend and vital signs over time. Will arrange a  follow-up visit with BMET.  2. Intermittent/persistent cough. No fevers or chills. No infiltrates by recent chest x-ray. If this is related to fluid overload, hopefully he will have improvement on higher dose diuretic. Otherwise could be associated component of viral URI.  3. CKD stage 3, recent creatinine 1.2.  4. Ischemic heart disease status post multiple previous interventions as detailed above. He continues on Plavix long-term with DES to the left main.  5. History of symptomatic second degree heart block status post [employer]. Jude leadless pacemaker, followed by Dr. Rayann Heman.  6. Permanent atrial fibrillation, continues on Coumadin. Heart rate has been adequately controlled on present regimen.  Current medicines were reviewed with the patient today.   Orders Placed This Encounter  Procedures  . Basic metabolic panel    Disposition: FU with me in 6 weeks.   Signed, Satira Sark, MD, [facility] 08/13/2015 10:55 AM    [facility] [place], [place], [place] [address] Phone: [phone]; Fax: [phone]

## 2015-08-13 NOTE — Patient Instructions (Signed)
   Take your Demadex 20mg  - 3 pills daily till weight reaches 195 pounds.  After reaching this goal, go back to previous dose of 2 pills daily.  Continue all other medications.   Lab for BMET - do just prior to next office visit. Follow up in  6 weeks.

## 2015-08-20 DIAGNOSIS — I482 Chronic atrial fibrillation: Secondary | ICD-10-CM | POA: Diagnosis not present

## 2015-09-01 DIAGNOSIS — M546 Pain in thoracic spine: Secondary | ICD-10-CM | POA: Diagnosis not present

## 2015-09-01 DIAGNOSIS — M9902 Segmental and somatic dysfunction of thoracic region: Secondary | ICD-10-CM | POA: Diagnosis not present

## 2015-09-01 DIAGNOSIS — M9901 Segmental and somatic dysfunction of cervical region: Secondary | ICD-10-CM | POA: Diagnosis not present

## 2015-09-01 DIAGNOSIS — M9903 Segmental and somatic dysfunction of lumbar region: Secondary | ICD-10-CM | POA: Diagnosis not present

## 2015-09-01 DIAGNOSIS — M47812 Spondylosis without myelopathy or radiculopathy, cervical region: Secondary | ICD-10-CM | POA: Diagnosis not present

## 2015-09-02 ENCOUNTER — Encounter: Payer: Self-pay | Admitting: *Deleted

## 2015-09-02 DIAGNOSIS — Z006 Encounter for examination for normal comparison and control in clinical research program: Secondary | ICD-10-CM

## 2015-09-02 NOTE — Progress Notes (Signed)
Patient was seen for Leadless battery check. No issues were noted with the device.  Patient is pacing 51% of the time. I will check patient in April at the pacer clinic.

## 2015-09-03 DIAGNOSIS — I482 Chronic atrial fibrillation: Secondary | ICD-10-CM | POA: Diagnosis not present

## 2015-09-04 DIAGNOSIS — M9903 Segmental and somatic dysfunction of lumbar region: Secondary | ICD-10-CM | POA: Diagnosis not present

## 2015-09-04 DIAGNOSIS — M9901 Segmental and somatic dysfunction of cervical region: Secondary | ICD-10-CM | POA: Diagnosis not present

## 2015-09-04 DIAGNOSIS — M9902 Segmental and somatic dysfunction of thoracic region: Secondary | ICD-10-CM | POA: Diagnosis not present

## 2015-09-04 DIAGNOSIS — M47812 Spondylosis without myelopathy or radiculopathy, cervical region: Secondary | ICD-10-CM | POA: Diagnosis not present

## 2015-09-04 DIAGNOSIS — M546 Pain in thoracic spine: Secondary | ICD-10-CM | POA: Diagnosis not present

## 2015-09-07 DIAGNOSIS — M9903 Segmental and somatic dysfunction of lumbar region: Secondary | ICD-10-CM | POA: Diagnosis not present

## 2015-09-07 DIAGNOSIS — M47812 Spondylosis without myelopathy or radiculopathy, cervical region: Secondary | ICD-10-CM | POA: Diagnosis not present

## 2015-09-07 DIAGNOSIS — M546 Pain in thoracic spine: Secondary | ICD-10-CM | POA: Diagnosis not present

## 2015-09-07 DIAGNOSIS — M9901 Segmental and somatic dysfunction of cervical region: Secondary | ICD-10-CM | POA: Diagnosis not present

## 2015-09-07 DIAGNOSIS — M9902 Segmental and somatic dysfunction of thoracic region: Secondary | ICD-10-CM | POA: Diagnosis not present

## 2015-09-09 ENCOUNTER — Other Ambulatory Visit: Payer: Self-pay | Admitting: *Deleted

## 2015-09-09 MED ORDER — CARVEDILOL 25 MG PO TABS: 25.0000 mg | ORAL_TABLET | Freq: Two times a day (BID) | ORAL | Status: DC

## 2015-09-10 DIAGNOSIS — M546 Pain in thoracic spine: Secondary | ICD-10-CM | POA: Diagnosis not present

## 2015-09-10 DIAGNOSIS — M9902 Segmental and somatic dysfunction of thoracic region: Secondary | ICD-10-CM | POA: Diagnosis not present

## 2015-09-10 DIAGNOSIS — M47812 Spondylosis without myelopathy or radiculopathy, cervical region: Secondary | ICD-10-CM | POA: Diagnosis not present

## 2015-09-10 DIAGNOSIS — M9903 Segmental and somatic dysfunction of lumbar region: Secondary | ICD-10-CM | POA: Diagnosis not present

## 2015-09-10 DIAGNOSIS — M9901 Segmental and somatic dysfunction of cervical region: Secondary | ICD-10-CM | POA: Diagnosis not present

## 2015-09-17 DIAGNOSIS — M9902 Segmental and somatic dysfunction of thoracic region: Secondary | ICD-10-CM | POA: Diagnosis not present

## 2015-09-17 DIAGNOSIS — M47812 Spondylosis without myelopathy or radiculopathy, cervical region: Secondary | ICD-10-CM | POA: Diagnosis not present

## 2015-09-17 DIAGNOSIS — M9901 Segmental and somatic dysfunction of cervical region: Secondary | ICD-10-CM | POA: Diagnosis not present

## 2015-09-17 DIAGNOSIS — M9903 Segmental and somatic dysfunction of lumbar region: Secondary | ICD-10-CM | POA: Diagnosis not present

## 2015-09-17 DIAGNOSIS — M546 Pain in thoracic spine: Secondary | ICD-10-CM | POA: Diagnosis not present

## 2015-09-23 DIAGNOSIS — I5022 Chronic systolic (congestive) heart failure: Secondary | ICD-10-CM | POA: Diagnosis not present

## 2015-09-24 ENCOUNTER — Other Ambulatory Visit: Payer: Self-pay | Admitting: Cardiology

## 2015-09-24 ENCOUNTER — Encounter: Payer: Self-pay | Admitting: *Deleted

## 2015-09-24 ENCOUNTER — Telehealth: Payer: Self-pay | Admitting: Cardiology

## 2015-09-24 ENCOUNTER — Encounter: Payer: Self-pay | Admitting: Cardiology

## 2015-09-24 ENCOUNTER — Ambulatory Visit (INDEPENDENT_AMBULATORY_CARE_PROVIDER_SITE_OTHER): Payer: Medicare Other | Admitting: Cardiology

## 2015-09-24 VITALS — BP 124/62 | HR 70 | Ht 69.0 in | Wt 204.0 lb

## 2015-09-24 DIAGNOSIS — I5043 Acute on chronic combined systolic (congestive) and diastolic (congestive) heart failure: Secondary | ICD-10-CM | POA: Diagnosis not present

## 2015-09-24 DIAGNOSIS — M47812 Spondylosis without myelopathy or radiculopathy, cervical region: Secondary | ICD-10-CM | POA: Diagnosis not present

## 2015-09-24 DIAGNOSIS — I482 Chronic atrial fibrillation, unspecified: Secondary | ICD-10-CM

## 2015-09-24 DIAGNOSIS — Z950 Presence of cardiac pacemaker: Secondary | ICD-10-CM

## 2015-09-24 DIAGNOSIS — M9902 Segmental and somatic dysfunction of thoracic region: Secondary | ICD-10-CM | POA: Diagnosis not present

## 2015-09-24 DIAGNOSIS — M9903 Segmental and somatic dysfunction of lumbar region: Secondary | ICD-10-CM | POA: Diagnosis not present

## 2015-09-24 DIAGNOSIS — M546 Pain in thoracic spine: Secondary | ICD-10-CM | POA: Diagnosis not present

## 2015-09-24 DIAGNOSIS — I25110 Atherosclerotic heart disease of native coronary artery with unstable angina pectoris: Secondary | ICD-10-CM | POA: Diagnosis not present

## 2015-09-24 DIAGNOSIS — N183 Chronic kidney disease, stage 3 unspecified: Secondary | ICD-10-CM

## 2015-09-24 DIAGNOSIS — R0602 Shortness of breath: Secondary | ICD-10-CM

## 2015-09-24 DIAGNOSIS — M9901 Segmental and somatic dysfunction of cervical region: Secondary | ICD-10-CM | POA: Diagnosis not present

## 2015-09-24 DIAGNOSIS — I255 Ischemic cardiomyopathy: Secondary | ICD-10-CM

## 2015-09-24 DIAGNOSIS — I441 Atrioventricular block, second degree: Secondary | ICD-10-CM

## 2015-09-24 MED ORDER — TORSEMIDE 20 MG PO TABS: 20.0000 mg | ORAL_TABLET | Freq: Two times a day (BID) | ORAL | Status: DC

## 2015-09-24 MED ORDER — DIPHENHYDRAMINE HCL 50 MG PO TABS: 50.0000 mg | ORAL_TABLET | Freq: Every evening | ORAL | Status: DC | PRN

## 2015-09-24 MED ORDER — PREDNISONE 50 MG PO TABS: ORAL_TABLET | ORAL | Status: DC

## 2015-09-24 NOTE — Patient Instructions (Addendum)
Your physician has recommended you make the following change in your medication:  Decrease torsemide to 20 mg twice daily. Continue all other medications the same. Your physician has requested that you have a cardiac catheterization. Cardiac catheterization is used to diagnose and/or treat various heart conditions. Doctors may recommend this procedure for a number of different reasons. The most common reason is to evaluate chest pain. Chest pain can be a symptom of coronary artery disease (CAD), and cardiac catheterization can show whether plaque is narrowing or blocking your heart's arteries. This procedure is also used to evaluate the valves, as well as measure the blood flow and oxygen levels in different parts of your heart. For further information please visit [email or web address]. Please follow instruction sheet, as given. Your physician recommends that you schedule a follow-up appointment in: 1 month.

## 2015-09-24 NOTE — Progress Notes (Signed)
Cardiology Office Note  Date: 09/24/2015   ID: [name], DOB Dec 09, 1928, MRN [id number]  PCP: Rory Percy, MD  Primary Cardiologist: Rozann Lesches, MD   Chief Complaint  Patient presents with  . Atrial Fibrillation  . Cardiomyopathy  . Coronary Artery Disease    History of Present Illness: [name] [name] is a medically complex 82 y.o. male last seen in December 2016. He is here today for a follow-up visit with his wife and daughter. He tells me that he feels worse in terms of stamina and dyspnea on exertion since last evaluation. This is despite advancing his Demadex as we recommended and getting his weight down 10 pounds from where it is today. His weight has crept back up and is only down 1 pound by our scales since December 2016. I reviewed his follow-up lab work which shows creatinine up to 1.9.  As is usually the case, he tends to dominate the conversation, minimizes input from family members, and interrupts me as I try to discuss his overall situation. In addition to the above symptoms he also complains of chronic back and leg pain, sees a chiropractor, thinks that this discomfort also makes him short of breath. He does not report any palpitations, and fortunately it seems that his atrial fibrillation has been fairly well controlled now that he is been consistent with his medications.  Chart review finds that the patient's St. Jude leadless pacemaker was interrogated via research protocol in January, documenting no significant issues with pacing 51% of the time. He is to be checked again in April.  Today I went over the patient's cardiac diagnoses in detail including with family members. It would seem that his atrial fibrillation is not an explanation for his progressive decline in stamina and shortness of breath. He also does not recall feeling much better even when his weight was down 10 pounds, so I am suspicious that ischemia may be contributing to this  picture. His last cardiac catheterization was in 2014 at which time he underwent placement of DES to the left main. He has been on Plavix since that point along with his Coumadin. The fact that his renal function deteriorated with further advancing Demadex also presents a limitation in managing his volume status.  Past Medical History  Diagnosis Date  . Chronic atrial fibrillation ([place])     a. Failed prior DCCV x 2; b. chronic coumadin.  . Essential hypertension, benign   . Mixed hyperlipidemia   . Chronic systolic heart failure (HCC)     a. EF 40-45% by echo 2012.  . GI bleed     December 2011 - subsequent discontinuation of Pradaxa  . Right bundle branch block   . Coronary atherosclerosis of native coronary artery     a. s/p MI 1998;  b. Chronically occluded LAD, patent DES diagonal, subtotally occluded anomalous circumflex - failed prior PCI;  b. 04/2013 Cath/PCI: LM 95d (3.0x16 Promus Premier DES), LAD 00, D1 patent stent, LCX small 100, RCA min irregs, R->L Collats.  . Bradycardia     a. s/p SJM nanostim leadless pacemaker implanted by Dr Rayann Heman  . Ischemic cardiomyopathy     LVEF 45%  . Pacemaker 01/18/2013    STJ Nanostim Leadless pacemaker implanted by Dr Rayann Heman  . History of pneumonia 1970's  . Type II diabetes mellitus ([place])   . Anemia   . History of blood transfusion 09/2010; 12/2012  . CVA (cerebral vascular accident) ([place]) 09/2010    Suspected  embolic while off anticoagulation.  . Arthritis   . CKD (chronic kidney disease) stage 3, GFR 30-59 ml/min     Past Surgical History  Procedure Laterality Date  . Pacemaker insertion  01/18/2013    STJ Nanostim Leadless pacemaker implanted by Dr Rayann Heman  . Lumbar disc surgery  2000's  . Cystectomy  1951  . Permanent pacemaker insertion N/A 01/18/2013    Procedure: PERMANENT PACEMAKER INSERTION;  Surgeon: Thompson Grayer, MD;  Location: [facility] CATH LAB;  Service: Cardiovascular;  Laterality: N/A;  . Left heart catheterization with coronary  angiogram N/A 05/03/2013    Procedure: LEFT HEART CATHETERIZATION WITH CORONARY ANGIOGRAM;  Surgeon: Peter M Martinique, MD;  Location: [facility] CATH LAB;  Service: Cardiovascular;  Laterality: N/A;    Current Outpatient Prescriptions  Medication Sig Dispense Refill  . Ascorbic Acid (VITAMIN C) 1000 MG tablet Take 1,000 mg by mouth daily.    Marland Kitchen atorvastatin (LIPITOR) 10 MG tablet TAKE ONE TABLET BY MOUTH EVERY DAY 30 tablet 6  . carvedilol (COREG) 25 MG tablet Take 1 tablet (25 mg total) by mouth 2 (two) times daily. 180 tablet 3  . clopidogrel (PLAVIX) 75 MG tablet TAKE ONE TABLET BY MOUTH EVERY DAY WITH BREAKFAST 30 tablet 6  . diltiazem (CARDIZEM CD) 360 MG 24 hr capsule Take 1 capsule (360 mg total) by mouth daily. 90 capsule 3  . guaiFENesin (MUCINEX) 600 MG 12 hr tablet Take 600 mg by mouth 2 (two) times daily as needed for congestion.    Marland Kitchen LANTUS 100 UNIT/ML injection Inject 30 Units into the skin at bedtime.     . Multiple Vitamins-Minerals (PRESERVISION AREDS) CAPS Take by mouth.    . nitroGLYCERIN (NITROSTAT) 0.4 MG SL tablet Place 1 tablet (0.4 mg total) under the tongue every 5 (five) minutes x 3 doses as needed for chest pain. 25 tablet 3  . torsemide (DEMADEX) 20 MG tablet Take 1 tablet (20 mg total) by mouth 2 (two) times daily. 180 tablet 3  . warfarin (COUMADIN) 2 MG tablet Take 2-3 mg by mouth See admin instructions. Managed by Dr. Nadara Mustard.  Take as directed by the coumadin clinic INR is 3.5 as of 07-22-14. Take 2mg  on Sunday, Mon, Wed, [place], and Sat. 3mg  on Tue and Friday    . diphenhydrAMINE (BENADRYL) 50 MG tablet Take 1 tablet (50 mg total) by mouth at bedtime as needed for itching. Take one tablet at 6:30 am on 10/02/15 1 tablet 0  . predniSONE (DELTASONE) 50 MG tablet Take one tablet at 6:30 pm on 10/01/15 Take one tablet at 1:30 am on 10/02/15 Take one tablet at 6:30 am on  10/02/15 3 tablet 0   No current facility-administered medications for this visit.   Allergies:  Colcrys;  Contrast media; Dabigatran; Penicillins; and Pradaxa   Social History: The patient  reports that he quit smoking about 46 years ago. His smoking use included Cigarettes. He has a 12 pack-year smoking history. He has never used smokeless tobacco. He reports that he does not drink alcohol or use illicit drugs.   Family History: The patient's family history includes Diabetes in his other; Hypertension in his other.   ROS:  Please see the history of present illness. Otherwise, complete review of systems is positive for progressive shortness of breath, intermittent atypical chest pain, "itchiness" in his years, chest congestion in the morning.  All other systems are reviewed and negative.   Physical Exam: VS:  BP 124/62 mmHg  Pulse 70  Ht  5\' 9"  (1.753 m)  Wt 204 lb (92.534 kg)  BMI 30.11 kg/m2  SpO2 98%, BMI Body mass index is 30.11 kg/(m^2).  Wt Readings from Last 3 Encounters:  09/24/15 204 lb (92.534 kg)  08/13/15 205 lb (92.987 kg)  05/25/15 206 lb (93.441 kg)    Overweight male, appears comfortable. HEENT: Conjunctiva and lids normal, oropharynx clear.  Neck: Supple, elevated JVP, no carotid bruits, no thyromegaly.  Lungs: Clear to auscultation, diminished. Cardiac Irregularly irregular, no S3, indistinct PMI.  Abdomen: Nontender, bowel sounds present, no guarding or rebound.  Extremities: 1-2+ leg edema, right worse than left, distal pulses 1-2+.  Musculoskeletal: No kyphosis.  Skin: Warm and dry.  Neuropsychiatric: Alert and oriented x3, hard of hearing, affect appropriate.  ECG: I personally reviewed his prior tracing from 05/25/2015 which showed atrial fibrillation at 90 bpm with right bundle branch block and left anterior fascicular block.  Recent Labwork:  September 2016: BUN 29, creatinine 1.6, potassium 5.0 December 2016: BUN 27, creatinine 1.2, AST 15, ALT 14, potassium 4.6, hemoglobin 11.4, platelets 164 February 2017: BUN 43, creatinine 1.9, potassium  4.5  Other Studies Reviewed Today:  Echocardiogram 07/24/2014: Study Conclusions  - Left ventricle: Akinesis inferior wall and apex and septum. The cavity size was mildly dilated. Wall thickness was increased in a pattern of mild LVH. The estimated ejection fraction was 30%. - Aortic valve: Sclerosis without stenosis. There was no significant regurgitation. - Left atrium: The atrium was moderately dilated. - Right ventricle: I am aware that the patient has a &quot;wireless pacemaker.&quot; I do not know exactly where it is implanted. In some images, there is question of a wire in the RA or RV. This may be artifact from the pacer? The cavity size was normal. Systolic function was normal. - Right atrium: The atrium was mildly dilated.  Chest x-ray Lovie Macadamia) 08/10/2015: Cardiomegaly with vascular congestion. No pulmonary edema or infiltrates.  Assessment and Plan:  1. Declining stamina and worsening dyspnea on exertion as outlined above. Patient is medically complex with chronic atrial fibrillation and history of heart block status post leadless pacemaker placement, cardiomyopathy with moderately reduced LVEF, multivessel CAD status post multiple prior percutaneous interventions including most recently DES to the left main in September 2014, and progressive renal insufficiency. He has been difficult to manage over time for a variety of reasons, and remains at relatively high risk for decompensation. Pilar Plate discussion had with patient and family today. I reviewed his history in detail. He states that he would like to "feel better" if possible, and after discussing the potential risks and benefits, our plan is to proceed with a left and right heart catheterization. This will give [place] the best picture of coronary anatomy in terms of any potential for revascularization, and also further hemodynamic evaluation that may help assist with medication adjustments. He has not responded to  diuresis as I would have liked, so it is not clear that all the symptoms are related to volume. He and his family are in agreement and this will be scheduled for next week. He will need to be off of Coumadin (no bridging), and given reported history of contrast allergy, he will be pretreated.  2. Permanent atrial fibrillation, rate controlled on present regimen, and continues on Coumadin. Although he has had trouble with RVR in the past in the setting of medication noncompliance, this is fortunately not been an issue of late.  3. History of symptomatic second-degree heart block status post St. Jude leadless pacemaker, followed  by Dr. Rayann Heman, and functioning appropriately based on recent interrogation.  4. CKD stage III, creatinine 1.2 up to 1.9 following advancement in Demadex. We plan to cut back his Demadex dose to previous level which he tolerated, and hold just prior to his cardiac catheterization. He will have follow-up lab work at that time.  5. CAD status post multiple percutaneous interventions, most recently DES to the left main in September 2014. He has remained on Plavix since that time.  6. Cardiomyopathy with LVEF in the 30% range based on prior assessment. I would not push to convert to ICD, rather try to further optimize management of ischemic burden and medical therapy. He will need to have a follow-up echocardiogram, but will proceed with cardiac catheterization first.  Current medicines were reviewed with the patient today.  Disposition: FU with me after cardiac catheterization.   Signed, Satira Sark, MD, [facility] 09/24/2015 12:40 PM    [facility] [place], [place], [place] [address] Phone: [phone]; Fax: [phone]

## 2015-09-24 NOTE — Telephone Encounter (Signed)
Right & Left heart cath holding coumadin 4 days prior on Friday, October 02, 2015 @7 :30 am with Dr. Burt Knack dx: worsening SOB & CAD w/angina Checking percert

## 2015-09-24 NOTE — Telephone Encounter (Signed)
No precert required 

## 2015-09-25 ENCOUNTER — Encounter: Payer: Self-pay | Admitting: Internal Medicine

## 2015-09-28 DIAGNOSIS — M9902 Segmental and somatic dysfunction of thoracic region: Secondary | ICD-10-CM | POA: Diagnosis not present

## 2015-09-28 DIAGNOSIS — M9903 Segmental and somatic dysfunction of lumbar region: Secondary | ICD-10-CM | POA: Diagnosis not present

## 2015-09-28 DIAGNOSIS — M546 Pain in thoracic spine: Secondary | ICD-10-CM | POA: Diagnosis not present

## 2015-09-28 DIAGNOSIS — M47812 Spondylosis without myelopathy or radiculopathy, cervical region: Secondary | ICD-10-CM | POA: Diagnosis not present

## 2015-09-28 DIAGNOSIS — M9901 Segmental and somatic dysfunction of cervical region: Secondary | ICD-10-CM | POA: Diagnosis not present

## 2015-10-02 ENCOUNTER — Encounter (HOSPITAL_COMMUNITY): Payer: Self-pay | Admitting: Cardiovascular Disease

## 2015-10-02 ENCOUNTER — Ambulatory Visit (HOSPITAL_COMMUNITY)
Admission: RE | Admit: 2015-10-02 | Discharge: 2015-10-02 | Disposition: A | Discharge status: Home / Self Care | Payer: Medicare Other | Source: Ambulatory Visit | Attending: Cardiovascular Disease | Admitting: Cardiovascular Disease

## 2015-10-02 ENCOUNTER — Encounter (HOSPITAL_COMMUNITY)
Admission: RE | Disposition: A | Discharge status: Home / Self Care | Payer: Self-pay | Source: Ambulatory Visit | Attending: Cardiovascular Disease

## 2015-10-02 DIAGNOSIS — G8929 Other chronic pain: Secondary | ICD-10-CM | POA: Diagnosis not present

## 2015-10-02 DIAGNOSIS — Z9114 Patient's other noncompliance with medication regimen: Secondary | ICD-10-CM | POA: Insufficient documentation

## 2015-10-02 DIAGNOSIS — Z794 Long term (current) use of insulin: Secondary | ICD-10-CM | POA: Diagnosis not present

## 2015-10-02 DIAGNOSIS — I482 Chronic atrial fibrillation: Secondary | ICD-10-CM | POA: Diagnosis not present

## 2015-10-02 DIAGNOSIS — Z9861 Coronary angioplasty status: Secondary | ICD-10-CM | POA: Diagnosis not present

## 2015-10-02 DIAGNOSIS — I272 Other secondary pulmonary hypertension: Secondary | ICD-10-CM | POA: Diagnosis not present

## 2015-10-02 DIAGNOSIS — I441 Atrioventricular block, second degree: Secondary | ICD-10-CM | POA: Insufficient documentation

## 2015-10-02 DIAGNOSIS — Z7902 Long term (current) use of antithrombotics/antiplatelets: Secondary | ICD-10-CM | POA: Diagnosis not present

## 2015-10-02 DIAGNOSIS — Q245 Malformation of coronary vessels: Secondary | ICD-10-CM | POA: Insufficient documentation

## 2015-10-02 DIAGNOSIS — Z8701 Personal history of pneumonia (recurrent): Secondary | ICD-10-CM | POA: Insufficient documentation

## 2015-10-02 DIAGNOSIS — Z950 Presence of cardiac pacemaker: Secondary | ICD-10-CM | POA: Insufficient documentation

## 2015-10-02 DIAGNOSIS — E1122 Type 2 diabetes mellitus with diabetic chronic kidney disease: Secondary | ICD-10-CM | POA: Insufficient documentation

## 2015-10-02 DIAGNOSIS — I252 Old myocardial infarction: Secondary | ICD-10-CM | POA: Diagnosis not present

## 2015-10-02 DIAGNOSIS — I4510 Unspecified right bundle-branch block: Secondary | ICD-10-CM | POA: Insufficient documentation

## 2015-10-02 DIAGNOSIS — E782 Mixed hyperlipidemia: Secondary | ICD-10-CM | POA: Insufficient documentation

## 2015-10-02 DIAGNOSIS — I130 Hypertensive heart and chronic kidney disease with heart failure and stage 1 through stage 4 chronic kidney disease, or unspecified chronic kidney disease: Secondary | ICD-10-CM | POA: Insufficient documentation

## 2015-10-02 DIAGNOSIS — Z8673 Personal history of transient ischemic attack (TIA), and cerebral infarction without residual deficits: Secondary | ICD-10-CM | POA: Insufficient documentation

## 2015-10-02 DIAGNOSIS — Z7901 Long term (current) use of anticoagulants: Secondary | ICD-10-CM | POA: Diagnosis not present

## 2015-10-02 DIAGNOSIS — N183 Chronic kidney disease, stage 3 (moderate): Secondary | ICD-10-CM | POA: Insufficient documentation

## 2015-10-02 DIAGNOSIS — Z87891 Personal history of nicotine dependence: Secondary | ICD-10-CM | POA: Insufficient documentation

## 2015-10-02 DIAGNOSIS — M1990 Unspecified osteoarthritis, unspecified site: Secondary | ICD-10-CM | POA: Insufficient documentation

## 2015-10-02 DIAGNOSIS — Z880 Allergy status to penicillin: Secondary | ICD-10-CM | POA: Insufficient documentation

## 2015-10-02 DIAGNOSIS — I255 Ischemic cardiomyopathy: Secondary | ICD-10-CM | POA: Insufficient documentation

## 2015-10-02 DIAGNOSIS — Z8249 Family history of ischemic heart disease and other diseases of the circulatory system: Secondary | ICD-10-CM | POA: Insufficient documentation

## 2015-10-02 DIAGNOSIS — I2582 Chronic total occlusion of coronary artery: Secondary | ICD-10-CM | POA: Diagnosis not present

## 2015-10-02 DIAGNOSIS — I5043 Acute on chronic combined systolic (congestive) and diastolic (congestive) heart failure: Secondary | ICD-10-CM | POA: Insufficient documentation

## 2015-10-02 DIAGNOSIS — Z955 Presence of coronary angioplasty implant and graft: Secondary | ICD-10-CM | POA: Insufficient documentation

## 2015-10-02 DIAGNOSIS — I5023 Acute on chronic systolic (congestive) heart failure: Secondary | ICD-10-CM | POA: Insufficient documentation

## 2015-10-02 DIAGNOSIS — Z91041 Radiographic dye allergy status: Secondary | ICD-10-CM | POA: Diagnosis not present

## 2015-10-02 DIAGNOSIS — D649 Anemia, unspecified: Secondary | ICD-10-CM | POA: Diagnosis not present

## 2015-10-02 DIAGNOSIS — I2510 Atherosclerotic heart disease of native coronary artery without angina pectoris: Secondary | ICD-10-CM | POA: Insufficient documentation

## 2015-10-02 HISTORY — PX: CARDIAC CATHETERIZATION: SHX172

## 2015-10-02 LAB — CBC
HEMATOCRIT: 29.7 % — AB (ref 39.0–52.0)
HEMOGLOBIN: 8.9 g/dL — AB (ref 13.0–17.0)
MCH: 26.2 pg (ref 26.0–34.0)
MCHC: 30 g/dL (ref 30.0–36.0)
MCV: 87.4 fL (ref 78.0–100.0)
PLATELETS: 150 10*3/uL (ref 150–400)
RBC: 3.4 MIL/uL — AB (ref 4.22–5.81)
RDW: 14.4 % (ref 11.5–15.5)
WBC: 8.2 10*3/uL (ref 4.0–10.5)

## 2015-10-02 LAB — POCT I-STAT 3, ART BLOOD GAS (G3+)
ACID-BASE DEFICIT: 4 mmol/L — AB (ref 0.0–2.0)
BICARBONATE: 20.9 meq/L (ref 20.0–24.0)
O2 Saturation: 90 %
PH ART: 7.379 (ref 7.350–7.450)
TCO2: 22 mmol/L (ref 0–100)
pCO2 arterial: 35.5 mmHg (ref 35.0–45.0)
pO2, Arterial: 60 mmHg — ABNORMAL LOW (ref 80.0–100.0)

## 2015-10-02 LAB — BASIC METABOLIC PANEL
ANION GAP: 14 (ref 5–15)
BUN: 48 mg/dL — ABNORMAL HIGH (ref 6–20)
CHLORIDE: 104 mmol/L (ref 101–111)
CO2: 22 mmol/L (ref 22–32)
CREATININE: 2.16 mg/dL — AB (ref 0.61–1.24)
Calcium: 9.4 mg/dL (ref 8.9–10.3)
GFR calc non Af Amer: 26 mL/min — ABNORMAL LOW (ref >60)
GFR, EST AFRICAN AMERICAN: 31 mL/min — AB (ref >60)
Glucose, Bld: 323 mg/dL — ABNORMAL HIGH (ref 65–99)
POTASSIUM: 4.9 mmol/L (ref 3.5–5.1)
SODIUM: 140 mmol/L (ref 135–145)

## 2015-10-02 LAB — POCT I-STAT 3, VENOUS BLOOD GAS (G3P V)
Acid-base deficit: 2 mmol/L (ref 0.0–2.0)
Bicarbonate: 22.7 mEq/L (ref 20.0–24.0)
O2 Saturation: 52 %
PCO2 VEN: 35.9 mmHg — AB (ref 45.0–50.0)
PH VEN: 7.409 — AB (ref 7.250–7.300)
TCO2: 24 mmol/L (ref 0–100)
pO2, Ven: 27 mmHg — CL (ref 30.0–45.0)

## 2015-10-02 LAB — GLUCOSE, CAPILLARY
GLUCOSE-CAPILLARY: 316 mg/dL — AB (ref 65–99)
Glucose-Capillary: 266 mg/dL — ABNORMAL HIGH (ref 65–99)
Glucose-Capillary: 298 mg/dL — ABNORMAL HIGH (ref 65–99)

## 2015-10-02 LAB — PROTIME-INR
INR: 1.81 — ABNORMAL HIGH (ref 0.00–1.49)
Prothrombin Time: 21 seconds — ABNORMAL HIGH (ref 11.6–15.2)

## 2015-10-02 LAB — POCT ACTIVATED CLOTTING TIME
Activated Clotting Time: 203 seconds
Activated Clotting Time: 183 seconds

## 2015-10-02 SURGERY — RIGHT/LEFT HEART CATH AND CORONARY ANGIOGRAPHY
Anesthesia: LOCAL

## 2015-10-02 MED ORDER — HEPARIN (PORCINE) IN NACL 2-0.9 UNIT/ML-% IJ SOLN
INTRAMUSCULAR | Status: AC
  Filled 2015-10-02: qty 1000

## 2015-10-02 MED ORDER — LIDOCAINE HCL (PF) 1 % IJ SOLN
INTRAMUSCULAR | Status: AC
  Filled 2015-10-02: qty 30

## 2015-10-02 MED ORDER — SODIUM CHLORIDE 0.9 % IV SOLN
INTRAVENOUS | Status: DC
  Administered 2015-10-02: 08:00:00 via INTRAVENOUS

## 2015-10-02 MED ORDER — SODIUM CHLORIDE 0.9 % IV SOLN: 250.0000 mL | INTRAVENOUS | Status: DC | PRN

## 2015-10-02 MED ORDER — FENTANYL CITRATE (PF) 100 MCG/2ML IJ SOLN
INTRAMUSCULAR | Status: DC | PRN
  Administered 2015-10-02: 25 ug via INTRAVENOUS

## 2015-10-02 MED ORDER — HEPARIN SODIUM (PORCINE) 1000 UNIT/ML IJ SOLN
INTRAMUSCULAR | Status: DC | PRN
  Administered 2015-10-02: 5000 [IU] via INTRAVENOUS

## 2015-10-02 MED ORDER — SODIUM BICARBONATE 8.4 % IV SOLN
INTRAVENOUS | Status: AC
  Administered 2015-10-02: 09:00:00 via INTRAVENOUS
  Filled 2015-10-02: qty 1000

## 2015-10-02 MED ORDER — MIDAZOLAM HCL 2 MG/2ML IJ SOLN
INTRAMUSCULAR | Status: DC | PRN
  Administered 2015-10-02: 1 mg via INTRAVENOUS

## 2015-10-02 MED ORDER — SODIUM CHLORIDE 0.9% FLUSH: 3.0000 mL | INTRAVENOUS | Status: DC | PRN

## 2015-10-02 MED ORDER — SODIUM CHLORIDE 0.9 % IV SOLN: INTRAVENOUS | Status: DC

## 2015-10-02 MED ORDER — ASPIRIN 81 MG PO CHEW
81.0000 mg | CHEWABLE_TABLET | ORAL | Status: AC
  Administered 2015-10-02: 81 mg via ORAL

## 2015-10-02 MED ORDER — VERAPAMIL HCL 2.5 MG/ML IV SOLN
INTRAVENOUS | Status: AC
  Filled 2015-10-02: qty 2

## 2015-10-02 MED ORDER — ASPIRIN 81 MG PO CHEW
CHEWABLE_TABLET | ORAL | Status: AC
  Administered 2015-10-02: 81 mg via ORAL
  Filled 2015-10-02: qty 1

## 2015-10-02 MED ORDER — FENTANYL CITRATE (PF) 100 MCG/2ML IJ SOLN
INTRAMUSCULAR | Status: AC
  Filled 2015-10-02: qty 2

## 2015-10-02 MED ORDER — SODIUM CHLORIDE 0.9% FLUSH: 3.0000 mL | Freq: Two times a day (BID) | INTRAVENOUS | Status: DC

## 2015-10-02 MED ORDER — ACETAMINOPHEN 325 MG PO TABS: 650.0000 mg | ORAL_TABLET | ORAL | Status: DC | PRN

## 2015-10-02 MED ORDER — IOHEXOL 350 MG/ML SOLN
INTRAVENOUS | Status: DC | PRN
  Administered 2015-10-02: 25 mL via INTRACARDIAC

## 2015-10-02 MED ORDER — MIDAZOLAM HCL 2 MG/2ML IJ SOLN
INTRAMUSCULAR | Status: AC
  Filled 2015-10-02: qty 2

## 2015-10-02 MED ORDER — ONDANSETRON HCL 4 MG/2ML IJ SOLN: 4.0000 mg | Freq: Four times a day (QID) | INTRAMUSCULAR | Status: DC | PRN

## 2015-10-02 MED ORDER — SODIUM BICARBONATE BOLUS VIA INFUSION
INTRAVENOUS | Status: AC
  Administered 2015-10-02: 09:00:00 via INTRAVENOUS
  Filled 2015-10-02: qty 1

## 2015-10-02 MED ORDER — VERAPAMIL HCL 2.5 MG/ML IV SOLN
INTRAVENOUS | Status: DC | PRN
  Administered 2015-10-02: 10 mL via INTRA_ARTERIAL

## 2015-10-02 SURGICAL SUPPLY — 14 items
CATH BALLN WEDGE 5F 110CM (CATHETERS) ×1 IMPLANT
CATH INFINITI 5 FR JL3.5 (CATHETERS) ×2 IMPLANT
CATH INFINITI JR4 5F (CATHETERS) ×1 IMPLANT
CATH SWAN GANZ 7F STRAIGHT (CATHETERS) ×1 IMPLANT
DEVICE RAD COMP TR BAND LRG (VASCULAR PRODUCTS) ×2 IMPLANT
GLIDESHEATH SLEND SS 6F .021 (SHEATH) ×2 IMPLANT
KIT HEART LEFT (KITS) ×2 IMPLANT
PACK CARDIAC CATHETERIZATION (CUSTOM PROCEDURE TRAY) ×2 IMPLANT
SHEATH FAST CATH BRACH 5F 5CM (SHEATH) ×1 IMPLANT
SHEATH PINNACLE 7F 10CM (SHEATH) ×1 IMPLANT
TRANSDUCER W/STOPCOCK (MISCELLANEOUS) ×3 IMPLANT
TUBING CIL FLEX 10 FLL-RA (TUBING) ×2 IMPLANT
WIRE HI TORQ VERSACORE-J 145CM (WIRE) ×1 IMPLANT
WIRE SAFE-T 1.5MM-J .035X260CM (WIRE) ×1 IMPLANT

## 2015-10-02 NOTE — Progress Notes (Signed)
Site area: rt groin Site Prior to Removal:  Level 0 Pressure Applied For:15 min Manual: yes   Patient Status During Pull:  Level 0 awake Post Pull Site:  Level 0 Post Pull Instructions Given:  yes Post Pull Pulses Present:  Dressing Applied:  yes Bedrest begins @ [id number] Comments:

## 2015-10-02 NOTE — H&P (View-Only) (Signed)
Cardiology Office Note  Date: 09/24/2015   ID: [name] [name], DOB [birth year], MRN [id number]  PCP: Rory Percy, MD  Primary Cardiologist: Rozann Lesches, MD   Chief Complaint  Patient presents with  . Atrial Fibrillation  . Cardiomyopathy  . Coronary Artery Disease    History of Present Illness: [name] [name] is a medically complex 82 y.o. male last seen in December 2016. He is here today for a follow-up visit with his wife and daughter. He tells me that he feels worse in terms of stamina and dyspnea on exertion since last evaluation. This is despite advancing his Demadex as we recommended and getting his weight down 10 pounds from where it is today. His weight has crept back up and is only down 1 pound by our scales since December 2016. I reviewed his follow-up lab work which shows creatinine up to 1.9.  As is usually the case, he tends to dominate the conversation, minimizes input from family members, and interrupts me as I try to discuss his overall situation. In addition to the above symptoms he also complains of chronic back and leg pain, sees a chiropractor, thinks that this discomfort also makes him short of breath. He does not report any palpitations, and fortunately it seems that his atrial fibrillation has been fairly well controlled now that he is been consistent with his medications.  Chart review finds that the patient's St. Jude leadless pacemaker was interrogated via research protocol in January, documenting no significant issues with pacing 51% of the time. He is to be checked again in April.  Today I went over the patient's cardiac diagnoses in detail including with family members. It would seem that his atrial fibrillation is not an explanation for his progressive decline in stamina and shortness of breath. He also does not recall feeling much better even when his weight was down 10 pounds, so I am suspicious that ischemia may be contributing to this  picture. His last cardiac catheterization was in 2014 at which time he underwent placement of DES to the left main. He has been on Plavix since that point along with his Coumadin. The fact that his renal function deteriorated with further advancing Demadex also presents a limitation in managing his volume status.  Past Medical History  Diagnosis Date  . Chronic atrial fibrillation ([place])     a. Failed prior DCCV x 2; b. chronic coumadin.  . Essential hypertension, benign   . Mixed hyperlipidemia   . Chronic systolic heart failure (HCC)     a. EF 40-45% by echo 2012.  . GI bleed     December 2011 - subsequent discontinuation of Pradaxa  . Right bundle branch block   . Coronary atherosclerosis of native coronary artery     a. s/p MI 1998;  b. Chronically occluded LAD, patent DES diagonal, subtotally occluded anomalous circumflex - failed prior PCI;  b. 04/2013 Cath/PCI: LM 95d (3.0x16 Promus Premier DES), LAD 00, D1 patent stent, LCX small 100, RCA min irregs, R->L Collats.  . Bradycardia     a. s/p SJM nanostim leadless pacemaker implanted by Dr Rayann Heman  . Ischemic cardiomyopathy     LVEF 45%  . Pacemaker 01/18/2013    STJ Nanostim Leadless pacemaker implanted by Dr Rayann Heman  . History of pneumonia 1970's  . Type II diabetes mellitus ([place])   . Anemia   . History of blood transfusion 09/2010; 12/2012  . CVA (cerebral vascular accident) ([place]) 09/2010    Suspected  embolic while off anticoagulation.  . Arthritis   . CKD (chronic kidney disease) stage 3, GFR 30-59 ml/min     Past Surgical History  Procedure Laterality Date  . Pacemaker insertion  01/18/2013    STJ Nanostim Leadless pacemaker implanted by Dr Rayann Heman  . Lumbar disc surgery  2000's  . Cystectomy  1951  . Permanent pacemaker insertion N/A 01/18/2013    Procedure: PERMANENT PACEMAKER INSERTION;  Surgeon: Thompson Grayer, MD;  Location: [facility] CATH LAB;  Service: Cardiovascular;  Laterality: N/A;  . Left heart catheterization with coronary  angiogram N/A 05/03/2013    Procedure: LEFT HEART CATHETERIZATION WITH CORONARY ANGIOGRAM;  Surgeon: Peter M Martinique, MD;  Location: [facility] CATH LAB;  Service: Cardiovascular;  Laterality: N/A;    Current Outpatient Prescriptions  Medication Sig Dispense Refill  . Ascorbic Acid (VITAMIN C) 1000 MG tablet Take 1,000 mg by mouth daily.    Marland Kitchen atorvastatin (LIPITOR) 10 MG tablet TAKE ONE TABLET BY MOUTH EVERY DAY 30 tablet 6  . carvedilol (COREG) 25 MG tablet Take 1 tablet (25 mg total) by mouth 2 (two) times daily. 180 tablet 3  . clopidogrel (PLAVIX) 75 MG tablet TAKE ONE TABLET BY MOUTH EVERY DAY WITH BREAKFAST 30 tablet 6  . diltiazem (CARDIZEM CD) 360 MG 24 hr capsule Take 1 capsule (360 mg total) by mouth daily. 90 capsule 3  . guaiFENesin (MUCINEX) 600 MG 12 hr tablet Take 600 mg by mouth 2 (two) times daily as needed for congestion.    Marland Kitchen LANTUS 100 UNIT/ML injection Inject 30 Units into the skin at bedtime.     . Multiple Vitamins-Minerals (PRESERVISION AREDS) CAPS Take by mouth.    . nitroGLYCERIN (NITROSTAT) 0.4 MG SL tablet Place 1 tablet (0.4 mg total) under the tongue every 5 (five) minutes x 3 doses as needed for chest pain. 25 tablet 3  . torsemide (DEMADEX) 20 MG tablet Take 1 tablet (20 mg total) by mouth 2 (two) times daily. 180 tablet 3  . warfarin (COUMADIN) 2 MG tablet Take 2-3 mg by mouth See admin instructions. Managed by Dr. Nadara Mustard.  Take as directed by the coumadin clinic INR is 3.5 as of 07-22-14. Take 2mg  on Sunday, Mon, Wed, [place], and Sat. 3mg  on Tue and Friday    . diphenhydrAMINE (BENADRYL) 50 MG tablet Take 1 tablet (50 mg total) by mouth at bedtime as needed for itching. Take one tablet at 6:30 am on 10/02/15 1 tablet 0  . predniSONE (DELTASONE) 50 MG tablet Take one tablet at 6:30 pm on 10/01/15 Take one tablet at 1:30 am on 10/02/15 Take one tablet at 6:30 am on  10/02/15 3 tablet 0   No current facility-administered medications for this visit.   Allergies:  Colcrys;  Contrast media; Dabigatran; Penicillins; and Pradaxa   Social History: The patient  reports that he quit smoking about 46 years ago. His smoking use included Cigarettes. He has a 12 pack-year smoking history. He has never used smokeless tobacco. He reports that he does not drink alcohol or use illicit drugs.   Family History: The patient's family history includes Diabetes in his other; Hypertension in his other.   ROS:  Please see the history of present illness. Otherwise, complete review of systems is positive for progressive shortness of breath, intermittent atypical chest pain, "itchiness" in his years, chest congestion in the morning.  All other systems are reviewed and negative.   Physical Exam: VS:  BP 124/62 mmHg  Pulse 70  Ht  5\' 9"  (1.753 m)  Wt 204 lb (92.534 kg)  BMI 30.11 kg/m2  SpO2 98%, BMI Body mass index is 30.11 kg/(m^2).  Wt Readings from Last 3 Encounters:  09/24/15 204 lb (92.534 kg)  08/13/15 205 lb (92.987 kg)  05/25/15 206 lb (93.441 kg)    Overweight male, appears comfortable. HEENT: Conjunctiva and lids normal, oropharynx clear.  Neck: Supple, elevated JVP, no carotid bruits, no thyromegaly.  Lungs: Clear to auscultation, diminished. Cardiac Irregularly irregular, no S3, indistinct PMI.  Abdomen: Nontender, bowel sounds present, no guarding or rebound.  Extremities: 1-2+ leg edema, right worse than left, distal pulses 1-2+.  Musculoskeletal: No kyphosis.  Skin: Warm and dry.  Neuropsychiatric: Alert and oriented x3, hard of hearing, affect appropriate.  ECG: I personally reviewed his prior tracing from 05/25/2015 which showed atrial fibrillation at 90 bpm with right bundle branch block and left anterior fascicular block.  Recent Labwork:  September 2016: BUN 29, creatinine 1.6, potassium 5.0 December 2016: BUN 27, creatinine 1.2, AST 15, ALT 14, potassium 4.6, hemoglobin 11.4, platelets 164 February 2017: BUN 43, creatinine 1.9, potassium  4.5  Other Studies Reviewed Today:  Echocardiogram 07/24/2014: Study Conclusions  - Left ventricle: Akinesis inferior wall and apex and septum. The cavity size was mildly dilated. Wall thickness was increased in a pattern of mild LVH. The estimated ejection fraction was 30%. - Aortic valve: Sclerosis without stenosis. There was no significant regurgitation. - Left atrium: The atrium was moderately dilated. - Right ventricle: I am aware that the patient has a &quot;wireless pacemaker.&quot; I do not know exactly where it is implanted. In some images, there is question of a wire in the RA or RV. This may be artifact from the pacer? The cavity size was normal. Systolic function was normal. - Right atrium: The atrium was mildly dilated.  Chest x-ray Lovie Macadamia) 08/10/2015: Cardiomegaly with vascular congestion. No pulmonary edema or infiltrates.  Assessment and Plan:  1. Declining stamina and worsening dyspnea on exertion as outlined above. Patient is medically complex with chronic atrial fibrillation and history of heart block status post leadless pacemaker placement, cardiomyopathy with moderately reduced LVEF, multivessel CAD status post multiple prior percutaneous interventions including most recently DES to the left main in September 2014, and progressive renal insufficiency. He has been difficult to manage over time for a variety of reasons, and remains at relatively high risk for decompensation. Pilar Plate discussion had with patient and family today. I reviewed his history in detail. He states that he would like to "feel better" if possible, and after discussing the potential risks and benefits, our plan is to proceed with a left and right heart catheterization. This will give [place] the best picture of coronary anatomy in terms of any potential for revascularization, and also further hemodynamic evaluation that may help assist with medication adjustments. He has not responded to  diuresis as I would have liked, so it is not clear that all the symptoms are related to volume. He and his family are in agreement and this will be scheduled for next week. He will need to be off of Coumadin (no bridging), and given reported history of contrast allergy, he will be pretreated.  2. Permanent atrial fibrillation, rate controlled on present regimen, and continues on Coumadin. Although he has had trouble with RVR in the past in the setting of medication noncompliance, this is fortunately not been an issue of late.  3. History of symptomatic second-degree heart block status post St. Jude leadless pacemaker, followed  by Dr. Rayann Heman, and functioning appropriately based on recent interrogation.  4. CKD stage III, creatinine 1.2 up to 1.9 following advancement in Demadex. We plan to cut back his Demadex dose to previous level which he tolerated, and hold just prior to his cardiac catheterization. He will have follow-up lab work at that time.  5. CAD status post multiple percutaneous interventions, most recently DES to the left main in September 2014. He has remained on Plavix since that time.  6. Cardiomyopathy with LVEF in the 30% range based on prior assessment. I would not push to convert to ICD, rather try to further optimize management of ischemic burden and medical therapy. He will need to have a follow-up echocardiogram, but will proceed with cardiac catheterization first.  Current medicines were reviewed with the patient today.  Disposition: FU with me after cardiac catheterization.   Signed, Satira Sark, MD, [facility] 09/24/2015 12:40 PM    [facility] [place], [place], [place] [address] Phone: [phone]; Fax: [phone]

## 2015-10-02 NOTE — Progress Notes (Signed)
Abnormal labs reported to Dr. Burt Knack, no new orders received. MD states he will come see pt.

## 2015-10-02 NOTE — Interval H&P Note (Signed)
Cath Lab Visit (complete for each Cath Lab visit)  Clinical Evaluation Leading to the Procedure:   ACS: No.  Non-ACS:    Anginal Classification: CCS III  Anti-ischemic medical therapy: Maximal Therapy (2 or more classes of medications)  Non-Invasive Test Results: No non-invasive testing performed  Prior CABG: No previous CABG      History and Physical Interval Note:  10/02/2015 10:37 AM  [name] [name]  has presented today for surgery, with the diagnosis of worsening sob, cad with angina  The various methods of treatment have been discussed with the patient and family. After consideration of risks, benefits and other options for treatment, the patient has consented to  Procedure(s): Right/Left Heart Cath and Coronary Angiography (N/A) as a surgical intervention .  The patient's history has been reviewed, patient examined, no change in status, stable for surgery.  I have reviewed the patient's chart and labs.  Questions were answered to the patient's satisfaction.     Sherren Mocha

## 2015-10-02 NOTE — Discharge Instructions (Addendum)
Angiogram, Care After °Refer to this sheet in the next few weeks. These instructions provide you with information about caring for yourself after your procedure. Your health care provider may also give you more specific instructions. Your treatment has been planned according to current medical practices, but problems sometimes occur. Call your health care provider if you have any problems or questions after your procedure. °WHAT TO EXPECT AFTER THE PROCEDURE °After your procedure, it is typical to have the following: °· Bruising at the catheter insertion site that usually fades within 1-2 weeks. °· Blood collecting in the tissue (hematoma) that may be painful to the touch. It should usually decrease in size and tenderness within 1-2 weeks. °HOME CARE INSTRUCTIONS °· Take medicines only as directed by your health care provider. °· You may shower 24-48 hours after the procedure or as directed by your health care provider. Remove the bandage (dressing) and gently wash the site with plain soap and water. Pat the area dry with a clean towel. Do not rub the site, because this may cause bleeding. °· Do not take baths, swim, or use a hot tub until your health care provider approves. °· Check your insertion site every day for redness, swelling, or drainage. °· Do not apply powder or lotion to the site. °· Do not lift over 10 lb (4.5 kg) for 5 days after your procedure or as directed by your health care provider. °· Ask your health care provider when it is okay to: °¨ Return to work or school. °¨ Resume usual physical activities or sports. °¨ Resume sexual activity. °· Do not drive home if you are discharged the same day as the procedure. Have someone else drive you. °· You may drive 24 hours after the procedure unless otherwise instructed by your health care provider. °· Do not operate machinery or power tools for 24 hours after the procedure or as directed by your health care provider. °· If your procedure was done as an  outpatient procedure, which means that you went home the same day as your procedure, a responsible adult should be with you for the first 24 hours after you arrive home. °· Keep all follow-up visits as directed by your health care provider. This is important. °SEEK MEDICAL CARE IF: °· You have a fever. °· You have chills. °· You have increased bleeding from the catheter insertion site. Hold pressure on the site. °SEEK IMMEDIATE MEDICAL CARE IF: °· You have unusual pain at the catheter insertion site. °· You have redness, warmth, or swelling at the catheter insertion site. °· You have drainage (other than a small amount of blood on the dressing) from the catheter insertion site. °· The catheter insertion site is bleeding, and the bleeding does not stop after 30 minutes of holding steady pressure on the site. °· The area near or just beyond the catheter insertion site becomes pale, cool, tingly, or numb. °  °This information is not intended to replace advice given to you by your health care provider. Make sure you discuss any questions you have with your health care provider. °  °Document Released: 02/17/2005 Document Revised: 08/22/2014 Document Reviewed: 01/02/2013 °Elsevier Interactive Patient Education ©2016 [employer] °Radial Site Care °Refer to this sheet in the next few weeks. These instructions provide you with information about caring for yourself after your procedure. Your health care provider may also give you more specific instructions. Your treatment has been planned according to current medical practices, but problems sometimes occur. Call your   health care provider if you have any problems or questions after your procedure. °WHAT TO EXPECT AFTER THE PROCEDURE °After your procedure, it is typical to have the following: °· Bruising at the radial site that usually fades within 1-2 weeks. °· Blood collecting in the tissue (hematoma) that may be painful to the touch. It should usually decrease in size and  tenderness within 1-2 weeks. °HOME CARE INSTRUCTIONS °· Take medicines only as directed by your health care provider. °· You may shower 24-48 hours after the procedure or as directed by your health care provider. Remove the bandage (dressing) and gently wash the site with plain soap and water. Pat the area dry with a clean towel. Do not rub the site, because this may cause bleeding. °· Do not take baths, swim, or use a hot tub until your health care provider approves. °· Check your insertion site every day for redness, swelling, or drainage. °· Do not apply powder or lotion to the site. °· Do not flex or bend the affected arm for 24 hours or as directed by your health care provider. °· Do not push or pull heavy objects with the affected arm for 24 hours or as directed by your health care provider. °· Do not lift over 10 lb (4.5 kg) for 5 days after your procedure or as directed by your health care provider. °· Ask your health care provider when it is okay to: °¨ Return to work or school. °¨ Resume usual physical activities or sports. °¨ Resume sexual activity. °· Do not drive home if you are discharged the same day as the procedure. Have someone else drive you. °· You may drive 24 hours after the procedure unless otherwise instructed by your health care provider. °· Do not operate machinery or power tools for 24 hours after the procedure. °· If your procedure was done as an outpatient procedure, which means that you went home the same day as your procedure, a responsible adult should be with you for the first 24 hours after you arrive home. °· Keep all follow-up visits as directed by your health care provider. This is important. °SEEK MEDICAL CARE IF: °· You have a fever. °· You have chills. °· You have increased bleeding from the radial site. Hold pressure on the site. °SEEK IMMEDIATE MEDICAL CARE IF: °· You have unusual pain at the radial site. °· You have redness, warmth, or swelling at the radial site. °· You  have drainage (other than a small amount of blood on the dressing) from the radial site. °· The radial site is bleeding, and the bleeding does not stop after 30 minutes of holding steady pressure on the site. °· Your arm or hand becomes pale, cool, tingly, or numb. °  °This information is not intended to replace advice given to you by your health care provider. Make sure you discuss any questions you have with your health care provider. °  °Document Released: 09/03/2010 Document Revised: 08/22/2014 Document Reviewed: 02/17/2014 °Elsevier Interactive Patient Education ©2016 [employer] ° °

## 2015-10-05 ENCOUNTER — Telehealth: Payer: Self-pay | Admitting: *Deleted

## 2015-10-05 ENCOUNTER — Encounter: Payer: Self-pay | Admitting: *Deleted

## 2015-10-05 DIAGNOSIS — I5042 Chronic combined systolic (congestive) and diastolic (congestive) heart failure: Secondary | ICD-10-CM

## 2015-10-05 DIAGNOSIS — N183 Chronic kidney disease, stage 3 unspecified: Secondary | ICD-10-CM

## 2015-10-05 DIAGNOSIS — R0602 Shortness of breath: Secondary | ICD-10-CM

## 2015-10-05 MED FILL — Heparin Sodium (Porcine) 2 Unit/ML in Sodium Chloride 0.9%: INTRAMUSCULAR | Qty: 500 | Status: AC

## 2015-10-05 MED FILL — Lidocaine HCl Local Preservative Free (PF) Inj 1%: INTRAMUSCULAR | Qty: 30 | Status: AC

## 2015-10-05 NOTE — Progress Notes (Signed)
Patient walked into office this morning about 9:45 am stating that he is supposed to have an echo and lab work today. Patient advised that he was not scheduled to have any testing today but there was mention of the recommendation of an echo per cardiac cath report. Patient c/o sob and wanted to know if he could have his hemoglobin checked at our office today. Nurse advised patient that he did need to have f/u CBC since his last hemoglobin 3 days ago was 8.9. Patient advised that he needed to f/u with his PCP about his hemoglobin and informed that this could be contributing to his SOB. Recent lab work faxed to PCP for review. Patient also c/o right sided pain this morning. Nurse advised patient to contact his PCP about this also. Patient denied dizziness, or chest pain. Patient advised that message would be sent to [facility] to see if echo should be ordered prior to office visit or if its should be addressed at up coming office visit. Patient verbalized understanding of plan and said that he would contact his PCP r/e his low hemoglobin and his SOB.

## 2015-10-05 NOTE — Telephone Encounter (Signed)
Daughter said that she was told that patient needed a f/u BMET today when he went for heart cath last week. Nurse advised daughter that no orders were placed for lab work. Per daughter, patient is seeing DR. Nadara Mustard tomorrow. Daughter advised that PCP could also f/u on kidney functioning as well as hemoglobin. Daughter verbalized understanding of plan.

## 2015-10-06 ENCOUNTER — Other Ambulatory Visit: Payer: Self-pay | Admitting: *Deleted

## 2015-10-06 DIAGNOSIS — N183 Chronic kidney disease, stage 3 unspecified: Secondary | ICD-10-CM

## 2015-10-06 DIAGNOSIS — R0602 Shortness of breath: Secondary | ICD-10-CM

## 2015-10-06 DIAGNOSIS — I5042 Chronic combined systolic (congestive) and diastolic (congestive) heart failure: Secondary | ICD-10-CM

## 2015-10-06 NOTE — Telephone Encounter (Signed)
Daughter informed that echo has been ordered and lab work order for [employer] sent to [facility].

## 2015-10-06 NOTE — Progress Notes (Signed)
Dr. Burt Knack mentioned that a follow-up echocardiogram could be considered after the recent heart catheterization. I think he even left a message with Trish to get it ordered, but not sure about details. Go ahead and have this done in the [place] office prior to next follow-up visit. He also was found to be anemic by pre-cath labs which could contributor to dyspnea as well. Make sure Dr. Nadara Mustard has recent labs so he can address this. He should already be scheduled to get BMET for [place] this week due to renal insufficiency.

## 2015-10-06 NOTE — Telephone Encounter (Signed)
Wife informed and verbalized understanding of plan. 

## 2015-10-06 NOTE — Telephone Encounter (Signed)
Progress Notes      [name] Laughter, LPN at [id number]     Status: Sign at close encounter       Expand All Collapse All   Patient walked into office this morning about 9:45 am stating that he is supposed to have an echo and lab work today. Patient advised that he was not scheduled to have any testing today but there was mention of the recommendation of an echo per cardiac cath report. Patient c/o sob and wanted to know if he could have his hemoglobin checked at our office today. Nurse advised patient that he did need to have f/u CBC since his last hemoglobin 3 days ago was 8.9. Patient advised that he needed to f/u with his PCP about his hemoglobin and informed that this could be contributing to his SOB. Recent lab work faxed to PCP for review. Patient also c/o right sided pain this morning. Nurse advised patient to contact his PCP about this also. Patient denied dizziness, or chest pain. Patient advised that message would be sent to [facility] to see if echo should be ordered prior to office visit or if its should be addressed at up coming office visit. Patient verbalized understanding of plan and said that he would contact his PCP r/e his low hemoglobin and his SOB.             Satira Sark, MD at 10/06/2015 7:55 AM     Status: Sign at close encounter       Expand All Collapse All   Dr. Burt Knack mentioned that a follow-up echocardiogram could be considered after the recent heart catheterization. I think he even left a message with Trish to get it ordered, but not sure about details. Go ahead and have this done in the [place] office prior to next follow-up visit. He also was found to be anemic by pre-cath labs which could contributor to dyspnea as well. Make sure Dr. Nadara Mustard has recent labs so he can address this. He should already be scheduled to get BMET for [place] this week due to renal insufficiency.

## 2015-10-06 NOTE — Telephone Encounter (Signed)
Patient's wife called wants a call back to her or Damein

## 2015-10-06 NOTE — Addendum Note (Signed)
Addended by: Merlene Laughter on: 10/06/2015 08:12 AM   Modules accepted: Orders

## 2015-10-06 NOTE — Progress Notes (Signed)
Please see phone note for details of response below.

## 2015-10-07 ENCOUNTER — Encounter: Payer: Medicare Other | Admitting: Cardiology

## 2015-10-08 ENCOUNTER — Telehealth: Payer: Self-pay | Admitting: *Deleted

## 2015-10-08 ENCOUNTER — Ambulatory Visit (INDEPENDENT_AMBULATORY_CARE_PROVIDER_SITE_OTHER): Payer: Medicare Other

## 2015-10-08 ENCOUNTER — Other Ambulatory Visit: Payer: Self-pay

## 2015-10-08 DIAGNOSIS — I5042 Chronic combined systolic (congestive) and diastolic (congestive) heart failure: Secondary | ICD-10-CM | POA: Diagnosis not present

## 2015-10-08 DIAGNOSIS — R0602 Shortness of breath: Secondary | ICD-10-CM

## 2015-10-08 NOTE — Telephone Encounter (Signed)
Patient's wife informed

## 2015-10-08 NOTE — Telephone Encounter (Signed)
-----   Message from Satira Sark, MD sent at 10/07/2015 12:12 PM EST ----- Reviewed. Potassium normal and renal function is relatively stable at 1.7.

## 2015-10-09 DIAGNOSIS — I130 Hypertensive heart and chronic kidney disease with heart failure and stage 1 through stage 4 chronic kidney disease, or unspecified chronic kidney disease: Secondary | ICD-10-CM | POA: Diagnosis not present

## 2015-10-09 DIAGNOSIS — N189 Chronic kidney disease, unspecified: Secondary | ICD-10-CM | POA: Diagnosis not present

## 2015-10-09 DIAGNOSIS — E1165 Type 2 diabetes mellitus with hyperglycemia: Secondary | ICD-10-CM | POA: Diagnosis not present

## 2015-10-09 DIAGNOSIS — Z7984 Long term (current) use of oral hypoglycemic drugs: Secondary | ICD-10-CM | POA: Diagnosis not present

## 2015-10-09 DIAGNOSIS — D62 Acute posthemorrhagic anemia: Secondary | ICD-10-CM | POA: Diagnosis not present

## 2015-10-09 DIAGNOSIS — Z79899 Other long term (current) drug therapy: Secondary | ICD-10-CM | POA: Diagnosis not present

## 2015-10-09 DIAGNOSIS — D696 Thrombocytopenia, unspecified: Secondary | ICD-10-CM | POA: Diagnosis not present

## 2015-10-09 DIAGNOSIS — I4891 Unspecified atrial fibrillation: Secondary | ICD-10-CM | POA: Diagnosis not present

## 2015-10-09 DIAGNOSIS — Z7902 Long term (current) use of antithrombotics/antiplatelets: Secondary | ICD-10-CM | POA: Diagnosis not present

## 2015-10-09 DIAGNOSIS — K922 Gastrointestinal hemorrhage, unspecified: Secondary | ICD-10-CM | POA: Diagnosis not present

## 2015-10-09 DIAGNOSIS — E1122 Type 2 diabetes mellitus with diabetic chronic kidney disease: Secondary | ICD-10-CM | POA: Diagnosis not present

## 2015-10-09 DIAGNOSIS — I509 Heart failure, unspecified: Secondary | ICD-10-CM | POA: Diagnosis not present

## 2015-10-09 DIAGNOSIS — K921 Melena: Secondary | ICD-10-CM | POA: Diagnosis not present

## 2015-10-09 DIAGNOSIS — Z794 Long term (current) use of insulin: Secondary | ICD-10-CM | POA: Diagnosis not present

## 2015-10-09 DIAGNOSIS — Z950 Presence of cardiac pacemaker: Secondary | ICD-10-CM | POA: Diagnosis not present

## 2015-10-09 DIAGNOSIS — D649 Anemia, unspecified: Secondary | ICD-10-CM | POA: Diagnosis not present

## 2015-10-09 DIAGNOSIS — Z7901 Long term (current) use of anticoagulants: Secondary | ICD-10-CM | POA: Diagnosis not present

## 2015-10-09 DIAGNOSIS — D500 Iron deficiency anemia secondary to blood loss (chronic): Secondary | ICD-10-CM | POA: Diagnosis not present

## 2015-10-09 DIAGNOSIS — D120 Benign neoplasm of cecum: Secondary | ICD-10-CM | POA: Diagnosis not present

## 2015-10-12 DIAGNOSIS — D120 Benign neoplasm of cecum: Secondary | ICD-10-CM | POA: Diagnosis not present

## 2015-10-12 HISTORY — PX: COLONOSCOPY: SHX174

## 2015-10-12 HISTORY — PX: ESOPHAGOGASTRODUODENOSCOPY: SHX1529

## 2015-10-14 ENCOUNTER — Telehealth: Payer: Self-pay | Admitting: *Deleted

## 2015-10-14 ENCOUNTER — Telehealth: Payer: Self-pay | Admitting: Cardiology

## 2015-10-14 NOTE — Telephone Encounter (Signed)
Daughter wanted to know when patient should restart coumadin since he was told to hold it for a colonoscopy on Friday. Patient was discharged yesterday and wasn't given clear instructions about restarting coumadin. Daughter advised to contact Dr. Lyman Speller office for instructions to restart coumadin since he manages patient's INR's. Daughter verbalized understanding of plan.

## 2015-10-14 NOTE — Telephone Encounter (Signed)
Patient was in [place] since last Friday until yesterday for hemoglobin.  Judson Roch has questions about when he needs to be seen and when he should start back certain medications

## 2015-10-14 NOTE — Telephone Encounter (Signed)
Wife informed and verbalized understanding of plan. 

## 2015-10-14 NOTE — Telephone Encounter (Signed)
-----   Message from Satira Sark, MD sent at 10/08/2015 11:16 AM EST ----- Study reviewed. LVEF is stable at 35%. There is mild to moderate mitral regurgitation and aortic valve sclerosis but no stenosis. Pulmonary artery pressures are elevated which is fairly consistent with what was seen at heart catheterization. This is likely a reflection of general volume status and his cardiomyopathy, both issues that we need to address via medical therapy adjustments as we have been doing. Keep office follow-up.

## 2015-10-15 DIAGNOSIS — I482 Chronic atrial fibrillation: Secondary | ICD-10-CM | POA: Diagnosis not present

## 2015-10-18 ENCOUNTER — Encounter (HOSPITAL_COMMUNITY): Payer: Self-pay | Admitting: *Deleted

## 2015-10-18 ENCOUNTER — Emergency Department (HOSPITAL_COMMUNITY)
Admission: EM | Admit: 2015-10-18 | Discharge: 2015-10-19 | Disposition: A | Discharge status: Home / Self Care | Payer: Medicare Other | Attending: Emergency Medicine | Admitting: Emergency Medicine

## 2015-10-18 ENCOUNTER — Emergency Department (HOSPITAL_COMMUNITY): Payer: Medicare Other

## 2015-10-18 DIAGNOSIS — I482 Chronic atrial fibrillation: Secondary | ICD-10-CM | POA: Diagnosis not present

## 2015-10-18 DIAGNOSIS — I2510 Atherosclerotic heart disease of native coronary artery without angina pectoris: Secondary | ICD-10-CM | POA: Diagnosis not present

## 2015-10-18 DIAGNOSIS — Z8673 Personal history of transient ischemic attack (TIA), and cerebral infarction without residual deficits: Secondary | ICD-10-CM | POA: Diagnosis not present

## 2015-10-18 DIAGNOSIS — I110 Hypertensive heart disease with heart failure: Secondary | ICD-10-CM | POA: Diagnosis not present

## 2015-10-18 DIAGNOSIS — D649 Anemia, unspecified: Secondary | ICD-10-CM | POA: Diagnosis not present

## 2015-10-18 DIAGNOSIS — E782 Mixed hyperlipidemia: Secondary | ICD-10-CM | POA: Diagnosis not present

## 2015-10-18 DIAGNOSIS — N183 Chronic kidney disease, stage 3 (moderate): Secondary | ICD-10-CM | POA: Insufficient documentation

## 2015-10-18 DIAGNOSIS — Z7901 Long term (current) use of anticoagulants: Secondary | ICD-10-CM | POA: Diagnosis not present

## 2015-10-18 DIAGNOSIS — Z87891 Personal history of nicotine dependence: Secondary | ICD-10-CM | POA: Insufficient documentation

## 2015-10-18 DIAGNOSIS — I509 Heart failure, unspecified: Secondary | ICD-10-CM | POA: Diagnosis not present

## 2015-10-18 DIAGNOSIS — I5023 Acute on chronic systolic (congestive) heart failure: Secondary | ICD-10-CM | POA: Diagnosis not present

## 2015-10-18 DIAGNOSIS — I129 Hypertensive chronic kidney disease with stage 1 through stage 4 chronic kidney disease, or unspecified chronic kidney disease: Secondary | ICD-10-CM | POA: Insufficient documentation

## 2015-10-18 DIAGNOSIS — E1122 Type 2 diabetes mellitus with diabetic chronic kidney disease: Secondary | ICD-10-CM | POA: Diagnosis not present

## 2015-10-18 DIAGNOSIS — Z79899 Other long term (current) drug therapy: Secondary | ICD-10-CM | POA: Diagnosis not present

## 2015-10-18 DIAGNOSIS — Z794 Long term (current) use of insulin: Secondary | ICD-10-CM | POA: Insufficient documentation

## 2015-10-18 DIAGNOSIS — R0602 Shortness of breath: Secondary | ICD-10-CM | POA: Diagnosis not present

## 2015-10-18 LAB — COMPREHENSIVE METABOLIC PANEL
ALT: 15 U/L — AB (ref 17–63)
AST: 17 U/L (ref 15–41)
Albumin: 3.4 g/dL — ABNORMAL LOW (ref 3.5–5.0)
Alkaline Phosphatase: 109 U/L (ref 38–126)
Anion gap: 8 (ref 5–15)
BUN: 29 mg/dL — ABNORMAL HIGH (ref 6–20)
CALCIUM: 8.5 mg/dL — AB (ref 8.9–10.3)
CHLORIDE: 104 mmol/L (ref 101–111)
CO2: 27 mmol/L (ref 22–32)
CREATININE: 1.51 mg/dL — AB (ref 0.61–1.24)
GFR, EST AFRICAN AMERICAN: 47 mL/min — AB (ref >60)
GFR, EST NON AFRICAN AMERICAN: 41 mL/min — AB (ref >60)
Glucose, Bld: 298 mg/dL — ABNORMAL HIGH (ref 65–99)
Potassium: 4.1 mmol/L (ref 3.5–5.1)
Sodium: 139 mmol/L (ref 135–145)
Total Bilirubin: 0.9 mg/dL (ref 0.3–1.2)
Total Protein: 6.2 g/dL — ABNORMAL LOW (ref 6.5–8.1)

## 2015-10-18 LAB — CBC WITH DIFFERENTIAL/PLATELET
Basophils Absolute: 0.1 10*3/uL (ref 0.0–0.1)
Basophils Relative: 1 %
EOS PCT: 1 %
Eosinophils Absolute: 0.1 10*3/uL (ref 0.0–0.7)
HCT: 32 % — ABNORMAL LOW (ref 39.0–52.0)
Hemoglobin: 9.8 g/dL — ABNORMAL LOW (ref 13.0–17.0)
LYMPHS ABS: 0.8 10*3/uL (ref 0.7–4.0)
LYMPHS PCT: 8 %
MCH: 27.5 pg (ref 26.0–34.0)
MCHC: 30.6 g/dL (ref 30.0–36.0)
MCV: 89.9 fL (ref 78.0–100.0)
MONO ABS: 1 10*3/uL (ref 0.1–1.0)
Monocytes Relative: 10 %
Neutro Abs: 7.9 10*3/uL — ABNORMAL HIGH (ref 1.7–7.7)
Neutrophils Relative %: 80 %
PLATELETS: 119 10*3/uL — AB (ref 150–400)
RBC: 3.56 MIL/uL — AB (ref 4.22–5.81)
RDW: 16.4 % — ABNORMAL HIGH (ref 11.5–15.5)
WBC: 9.9 10*3/uL (ref 4.0–10.5)

## 2015-10-18 LAB — TROPONIN I

## 2015-10-18 LAB — BRAIN NATRIURETIC PEPTIDE: B NATRIURETIC PEPTIDE 5: 557 pg/mL — AB (ref 0.0–100.0)

## 2015-10-18 MED ORDER — FUROSEMIDE 10 MG/ML IJ SOLN
40.0000 mg | Freq: Once | INTRAMUSCULAR | Status: AC
  Administered 2015-10-19: 40 mg via INTRAVENOUS
  Filled 2015-10-18: qty 4

## 2015-10-18 NOTE — ED Notes (Signed)
Pt reports bilateral lower extremity and abdominal swelling x 2 days. Pt also reports worsening of sob today. Pt reports being [place] last Tuesday. Pt had a heart cath on Feb. 19th and also received 4 pints of blood last weekend during his admission at [facility] last week.

## 2015-10-19 ENCOUNTER — Telehealth: Payer: Self-pay | Admitting: *Deleted

## 2015-10-19 LAB — URINALYSIS, ROUTINE W REFLEX MICROSCOPIC
Bilirubin Urine: NEGATIVE
GLUCOSE, UA: NEGATIVE mg/dL
Hgb urine dipstick: NEGATIVE
Ketones, ur: NEGATIVE mg/dL
LEUKOCYTES UA: NEGATIVE
Nitrite: NEGATIVE
PH: 5.5 (ref 5.0–8.0)
PROTEIN: NEGATIVE mg/dL
Specific Gravity, Urine: 1.01 (ref 1.005–1.030)

## 2015-10-19 NOTE — Telephone Encounter (Signed)
Find out how he has been taking his diuretics most recently, specifically Demadex dose, and we can make some adjustments prior to his visit later this week.

## 2015-10-19 NOTE — ED Provider Notes (Signed)
CSN: [id number]     Arrival date & time 10/18/15  2230 History   First MD Initiated Contact with Patient 10/18/15 2331     Chief Complaint  Patient presents with  . Shortness of Breath     (Consider location/radiation/quality/duration/timing/severity/associated sxs/prior Treatment) HPI  This is an 82 year old male with a history of coronary artery disease, CHF, GI bleed who presents with shortness of breath. Patient reports 6 months history of worsening shortness of breath. He describes dyspnea on exertion and orthopnea. He recently had a cardiac catheterization with stable disease and chronic occlusions. He was found to be anemic. He has had a recent workup had a recent workup at [facility] for GI bleed. Reports that they could not find any active bleeding. At that time he received 4 units of blood and was temporarily taken off of his Coumadin. Patient states that he has noted increased lower leg swelling and some abdominal swelling over the last 2 days. No fevers. He reports an occasional cough. He states that he feels like shortness of breath is may be "a little bit worse." He denies any chest pain.     cardiologist is Dr. Domenic Polite. Recent heart catheterization with chronic occlusions and no significant change from prior. Medical therapy recommended. He did have a repeat echocardiogram that showed an EF of 35%. At discharge from [facility] he was decreased on his torsemide to 20 mg twice a day. He states that for the last 2 days he has taken "3 pills a day."  Past Medical History  Diagnosis Date  . Chronic atrial fibrillation ([place])     a. Failed prior DCCV x 2; b. chronic coumadin.  . Essential hypertension, benign   . Mixed hyperlipidemia   . Chronic systolic heart failure (HCC)     a. EF 40-45% by echo 2012.  . GI bleed     December 2011 - subsequent discontinuation of Pradaxa  . Right bundle branch block   . Coronary atherosclerosis of native coronary artery     a. s/p MI 1998;   b. Chronically occluded LAD, patent DES diagonal, subtotally occluded anomalous circumflex - failed prior PCI;  b. 04/2013 Cath/PCI: LM 95d (3.0x16 Promus Premier DES), LAD 00, D1 patent stent, LCX small 100, RCA min irregs, R->L Collats.  . Bradycardia     a. s/p SJM nanostim leadless pacemaker implanted by Dr Rayann Heman  . Ischemic cardiomyopathy     LVEF 45%  . Pacemaker 01/18/2013    STJ Nanostim Leadless pacemaker implanted by Dr Rayann Heman  . History of pneumonia 1970's  . Type II diabetes mellitus ([place])   . Anemia   . History of blood transfusion 09/2010; 12/2012  . CVA (cerebral vascular accident) ([place]) 09/2010    Suspected embolic while off anticoagulation.  . Arthritis   . CKD (chronic kidney disease) stage 3, GFR 30-59 ml/min   . CHF (congestive heart failure) [facility])    Past Surgical History  Procedure Laterality Date  . Pacemaker insertion  01/18/2013    STJ Nanostim Leadless pacemaker implanted by Dr Rayann Heman  . Lumbar disc surgery  2000's  . Cystectomy  1951  . Permanent pacemaker insertion N/A 01/18/2013    Procedure: PERMANENT PACEMAKER INSERTION;  Surgeon: Thompson Grayer, MD;  Location: [facility] CATH LAB;  Service: Cardiovascular;  Laterality: N/A;  . Left heart catheterization with coronary angiogram N/A 05/03/2013    Procedure: LEFT HEART CATHETERIZATION WITH CORONARY ANGIOGRAM;  Surgeon: Peter M Martinique, MD;  Location: [facility] CATH LAB;  Service: Cardiovascular;  Laterality: N/A;  . Cardiac catheterization N/A 10/02/2015    Procedure: Right/Left Heart Cath and Coronary Angiography;  Surgeon: Sherren Mocha, MD;  Location: [place] CV LAB;  Service: Cardiovascular;  Laterality: N/A;   Family History  Problem Relation Age of Onset  . Diabetes Other   . Hypertension Other    Social History  Substance Use Topics  . Smoking status: Former Smoker -- 0.30 packs/day for 40 years    Types: Cigarettes    Quit date: 08/15/1969  . Smokeless tobacco: Never Used  . Alcohol Use: No    Review of  Systems  Constitutional: Negative.  Negative for fever.  Respiratory: Positive for shortness of breath. Negative for chest tightness.   Cardiovascular: Positive for leg swelling. Negative for chest pain.  Gastrointestinal: Negative.  Negative for abdominal pain.  Genitourinary: Negative.  Negative for dysuria.  Musculoskeletal: Negative for back pain.  All other systems reviewed and are negative.     Allergies  Colcrys; Contrast media; Penicillins; and Pradaxa  Home Medications   Prior to Admission medications   Medication Sig Start Date End Date Taking? Authorizing Provider  Ascorbic Acid (VITAMIN C) 1000 MG tablet Take 1,000 mg by mouth daily.    Historical Provider, MD  atorvastatin (LIPITOR) 10 MG tablet TAKE ONE TABLET BY MOUTH EVERY DAY 11/04/13   Satira Sark, MD  carvedilol (COREG) 25 MG tablet Take 1 tablet (25 mg total) by mouth 2 (two) times daily. 09/09/15   Satira Sark, MD  clopidogrel (PLAVIX) 75 MG tablet TAKE ONE TABLET BY MOUTH EVERY DAY WITH BREAKFAST 12/09/13   Satira Sark, MD  diltiazem (CARDIZEM CD) 360 MG 24 hr capsule Take 1 capsule (360 mg total) by mouth daily. 11/24/14   Thompson Grayer, MD  diphenhydrAMINE (BENADRYL) 50 MG tablet Take 1 tablet (50 mg total) by mouth at bedtime as needed for itching. Take one tablet at 6:30 am on 10/02/15 09/24/15   Satira Sark, MD  guaiFENesin (MUCINEX) 600 MG 12 hr tablet Take 600 mg by mouth daily.     Historical Provider, MD  LANTUS 100 UNIT/ML injection Inject 30 Units into the skin at bedtime.  10/06/10   Historical Provider, MD  Multiple Vitamins-Minerals (PRESERVISION AREDS) TABS Take 1 tablet by mouth daily.     Historical Provider, MD  nitroGLYCERIN (NITROSTAT) 0.4 MG SL tablet Place 1 tablet (0.4 mg total) under the tongue every 5 (five) minutes x 3 doses as needed for chest pain. 08/20/14   Satira Sark, MD  predniSONE (DELTASONE) 50 MG tablet Take one tablet at 6:30 pm on 10/01/15 Take one tablet at  1:30 am on 10/02/15 Take one tablet at 6:30 am on  10/02/15 09/24/15   Satira Sark, MD  torsemide (DEMADEX) 20 MG tablet Take 1 tablet (20 mg total) by mouth 2 (two) times daily. 09/24/15   Satira Sark, MD  warfarin (COUMADIN) 2 MG tablet Take 2-3 mg by mouth See admin instructions. Managed by Dr. Nadara Mustard.  Take as directed by the coumadin clinic INR is 3.5 as of 07-22-14. Take 2mg  on Sunday, Mon, Wed, [place], and Sat. 3mg  on Tue and Friday    Historical Provider, MD   BP 133/80 mmHg  Pulse 71  Temp(Src) 97.9 F (36.6 C) (Oral)  Resp 24  Ht 5' 9.5" (1.765 m)  Wt 210 lb (95.255 kg)  BMI 30.58 kg/m2  SpO2 95% Physical Exam  Constitutional: He is oriented to person,  place, and time. No distress.  HENT:  Head: Normocephalic and atraumatic.  Eyes: Pupils are equal, round, and reactive to light.  Cardiovascular: Normal rate, regular rhythm and normal heart sounds.   No murmur heard. Pulmonary/Chest: Effort normal and breath sounds normal. No respiratory distress. He has no wheezes. He has no rales.  Abdominal: Soft. Bowel sounds are normal. There is no tenderness. There is no rebound.  Musculoskeletal: He exhibits edema.  2+ pitting lower extremity edema bilaterally  Neurological: He is alert and oriented to person, place, and time.  Skin: Skin is warm and dry.  Psychiatric: He has a normal mood and affect.  Nursing note and vitals reviewed.   ED Course  Procedures (including critical care time) Labs Review Labs Reviewed  CBC WITH DIFFERENTIAL/PLATELET - Abnormal; Notable for the following:    RBC 3.56 (*)    Hemoglobin 9.8 (*)    HCT 32.0 (*)    RDW 16.4 (*)    Platelets 119 (*)    Neutro Abs 7.9 (*)    All other components within normal limits  COMPREHENSIVE METABOLIC PANEL - Abnormal; Notable for the following:    Glucose, Bld 298 (*)    BUN 29 (*)    Creatinine, Ser 1.51 (*)    Calcium 8.5 (*)    Total Protein 6.2 (*)    Albumin 3.4 (*)    ALT 15 (*)    GFR calc  non Af Amer 41 (*)    GFR calc Af Amer 47 (*)    All other components within normal limits  BRAIN NATRIURETIC PEPTIDE - Abnormal; Notable for the following:    B Natriuretic Peptide 557.0 (*)    All other components within normal limits  TROPONIN I  URINALYSIS, ROUTINE W REFLEX MICROSCOPIC (NOT AT [facility])    Imaging Review Dg Chest 2 View  10/18/2015  CLINICAL DATA:  Bilateral leg swelling and shortness of breath. EXAM: CHEST  2 VIEW COMPARISON:  08/10/2015 FINDINGS: Cardiomegaly, not significantly changed. Small bilateral pleural effusions, right greater than left with associated bibasilar opacities, likely atelectasis. Mild interstitial edema, new from prior. Implanted cardiac monitor projects over the cardiac silhouette. There is a coronary stent. No pneumothorax. IMPRESSION: Findings consistent with mild CHF. Electronically Signed   By: Jeb Levering M.D.   On: 10/18/2015 23:40   I have personally reviewed and evaluated these images and lab results as part of my medical decision-making.   EKG Interpretation   Date/Time:  Sunday October 18 2015 22:52:51 EST Ventricular Rate:  69 PR Interval:    QRS Duration: 189 QT Interval:  490 QTC Calculation: 525 R Axis:   -69 Text Interpretation:  Ventricular-paced rhythm No further analysis  attempted due to paced rhythm Baseline wander in lead(s) V3 No significant  change since last tracing Confirmed by Naraly Fritcher  MD, [place] ([id number]) on  10/18/2015 11:31:27 PM      MDM   Final diagnoses:  Acute on chronic systolic congestive heart failure (HCC)  Anemia, unspecified anemia type     Patient presents with shortness of breath. Appears to be acute on chronic. Does report several days of worsening lower extremity swelling. Recent cardiac cath as well as a workup for GI bleed.  Hemoglobin is 9.8. Reports hemoglobin at [facility] was 7.2. He did receive multiple blood transfusions at outside facility. Currently not taking his Coumadin. His pulmonary  exam is reassuring but lower extremities are edematous. Workup notable for x-ray consistent with mild CHF. BNP is 557.  EKG and troponin are reassuring. Patient's renal function has improved back to his baseline around 1.5. He was given 40 mg of IV Lasix. Feel his presentation is most consistent with a mild CHF exacerbation. He was able to ambulate and maintain his pulse ox greater than 96%. He may have some element of anemia related shortness of breath as well. This is not new and has been worked up as well. Discussed with the patient and his family that he needs to follow-up closely with cardiology. He may need further adjustment in his torsemide. Patient stated understanding.  After history, exam, and medical workup I feel the patient has been appropriately medically screened and is safe for discharge home. Pertinent diagnoses were discussed with the patient. Patient was given return precautions.     Merryl Hacker, MD 10/19/15 0111

## 2015-10-19 NOTE — ED Notes (Signed)
Patient ambulated with assist, Oxygen level 96% and Heart rate @ 76. Tolerated well.

## 2015-10-19 NOTE — Telephone Encounter (Signed)
Returned call to patient.  Stated he has been taking Torsemide 20mg  - 2 tabs in the morning & 1 tab in the evening.    Discussed above with Dr. Domenic Polite - can have patient increase to 2 tabs twice a day for now.  Keep follow up this Thursday.   Patient informed of above & verbalized understanding.

## 2015-10-19 NOTE — Discharge Instructions (Signed)

## 2015-10-19 NOTE — Telephone Encounter (Signed)
Patient calling with c/o swelling in feet & legs.  Went to [facility] ED last evening, was given IV Lasix.  Weight then was 210.  Stated weight this morning was 207.  Denies chest pain or dizziness.  SOB is not as bad as yesterday, but still noticeable.  Questions what else he can do to get rid of fluid in feet.  Patient does have OV already scheduled for Thursday, 10/22/2015.

## 2015-10-19 NOTE — ED Notes (Signed)
Pt alert & oriented x4, stable gait. Patient given discharge instructions, paperwork & prescription(s). Patient instructed to stop at the registration desk to finish any additional paperwork. Patient verbalized understanding. Pt left department in wheelchair escorted by staff. Pt left w/ no further questions. 

## 2015-10-22 ENCOUNTER — Encounter: Payer: Self-pay | Admitting: *Deleted

## 2015-10-22 ENCOUNTER — Encounter: Payer: Self-pay | Admitting: Cardiology

## 2015-10-22 ENCOUNTER — Encounter: Payer: Medicare Other | Admitting: Cardiology

## 2015-10-22 ENCOUNTER — Ambulatory Visit (INDEPENDENT_AMBULATORY_CARE_PROVIDER_SITE_OTHER): Payer: Medicare Other | Admitting: Cardiology

## 2015-10-22 VITALS — BP 120/64 | HR 63 | Ht 69.0 in | Wt 209.0 lb

## 2015-10-22 DIAGNOSIS — I340 Nonrheumatic mitral (valve) insufficiency: Secondary | ICD-10-CM

## 2015-10-22 DIAGNOSIS — I2510 Atherosclerotic heart disease of native coronary artery without angina pectoris: Secondary | ICD-10-CM | POA: Diagnosis not present

## 2015-10-22 DIAGNOSIS — I482 Chronic atrial fibrillation, unspecified: Secondary | ICD-10-CM

## 2015-10-22 DIAGNOSIS — N183 Chronic kidney disease, stage 3 unspecified: Secondary | ICD-10-CM

## 2015-10-22 DIAGNOSIS — I5043 Acute on chronic combined systolic (congestive) and diastolic (congestive) heart failure: Secondary | ICD-10-CM | POA: Diagnosis not present

## 2015-10-22 DIAGNOSIS — R0602 Shortness of breath: Secondary | ICD-10-CM | POA: Diagnosis not present

## 2015-10-22 DIAGNOSIS — Z8719 Personal history of other diseases of the digestive system: Secondary | ICD-10-CM

## 2015-10-22 NOTE — Patient Instructions (Addendum)
Your physician recommends that you continue on your current medications as directed. Please refer to the Current Medication list given to you today. Restart your coumadin at previous dosing. Please be in touch with Dr. Lyman Speller office about checking your INR levels. Continue demadex 40 mg twice daily. Your physician recommends that you return for lab work in: one week to check your BMET & CBC. You have been referred to a gastroenterologist. You have been referred to the [facility] in [place]. Your physician recommends that you schedule a follow-up appointment in: 1 week.

## 2015-10-22 NOTE — Progress Notes (Signed)
Cardiology Office Note  Date: 10/22/2015   ID: [name] [name], DOB Jan 21, 1929, MRN [id number]  PCP: Rory Percy, MD  Primary Cardiologist: Rozann Lesches, MD   Chief Complaint  Patient presents with  . Cardiomyopathy  . Coronary Artery Disease  . Atrial Fibrillation    History of Present Illness: [name] [name] is a medically complex 82 y.o. male last seen in February of this year. At that time decision was made to pursue a right and left heart catheterization for further evaluation of dyspnea on exertion and declining stamina. Preprocedure lab work revealed hemoglobin 8.9 and creatinine 2.2. Limited IV contrast was used. Dr. 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 RCA was patent, and there was continued patency of the left main stent and the first diagonal. PASP was 52 mmHg and he had prominent V waves. Medical therapy was recommended a follow-up echocardiogram was ordered. Echocardiogram is reported below, I reviewed the results today with the patient and family.  Patient was seen in the ER at [facility] on March 5 with worsening shortness of breath and leg edema consistent with acute on chronic systolic heart failure.  I reviewed the chest x-ray and lab work. He was not admitted to the hospital, told to follow-up with cardiology. Most recent follow-up lab work shows improvement in creatinine to 1.5 and hemoglobin of 9.8.  He has had an anemia evaluation in [place] recently. I do not have complete records as yet. Patient's daughter indicates that he underwent an endoscopy and a colonoscopy with Dr. Anthony Sar, was told that he had no major active bleeding source, but a "vessel" in his colon that looked like it may have bled recently. Also a polyp that did not look of major concern. There was discussion about referring him to GI to consider capsule endoscopy, although no plans of yet  been made. He has not yet seen Dr. Nadara Mustard in the [place] ([place] He has been off Coumadin since prior to endoscopy, was told to follow-up with me regarding decision to restart or not.  He reports NYHA class III dyspnea, no chest pain. He states that his weight is starting to trend down since we increased his Demadex to 40 mg twice daily earlier this week.  Today we had a frank discussion about his risk for cardiac decompensation as well as stroke. I am concerned about his shortness of breath and recent weight gain, although he seems to be perhaps turning the corner with an increase in Demadex. I did talk with him about possibly being admitted to the hospital if however his symptoms do not improve in the next week. His risk of stroke is high off anticoagulation, in fact he has had a stroke in the past in the setting of interrupted anticoagulation. He also has a history of prior GI bleeding while on Pradaxa, and now while having been on Coumadin, although in general he has tolerated Coumadin over the last several years. He also remains on Plavix status post DES to the left main.  We have decided that for the time being he will resume Coumadin. Main concern is to try to reduce his stroke risk pending further GI consultation. He is 2 weeks out from endoscopy and his hemoglobin is trending back up. Being on Coumadin should not interfere with him having a capsule study either.  Past Medical History  Diagnosis Date  . Chronic atrial  fibrillation ([place])     a. Failed prior DCCV x 2; b. chronic coumadin.  . Essential hypertension, benign   . Mixed hyperlipidemia   . Chronic systolic heart failure (HCC)     a. EF 40-45% by echo 2012.  . GI bleed     December 2011 - subsequent discontinuation of Pradaxa  . Right bundle branch block   . Coronary atherosclerosis of native coronary artery     a. s/p MI 1998;  b. Chronically occluded LAD, patent DES diagonal, subtotally occluded anomalous circumflex  - failed prior PCI;  b. 04/2013 Cath/PCI: LM 95d (3.0x16 Promus Premier DES), LAD 00, D1 patent stent, LCX small 100, RCA min irregs, R->L Collats.  . Bradycardia     a. s/p SJM nanostim leadless pacemaker implanted by Dr Rayann Heman  . Ischemic cardiomyopathy     LVEF 45%  . Pacemaker 01/18/2013    STJ Nanostim Leadless pacemaker implanted by Dr Rayann Heman  . History of pneumonia 1970's  . Type II diabetes mellitus ([place])   . Anemia   . History of blood transfusion 09/2010; 12/2012  . CVA (cerebral vascular accident) ([place]) 09/2010    Suspected embolic while off anticoagulation.  . Arthritis   . CKD (chronic kidney disease) stage 3, GFR 30-59 ml/min     Past Surgical History  Procedure Laterality Date  . Pacemaker insertion  01/18/2013    STJ Nanostim Leadless pacemaker implanted by Dr Rayann Heman  . Lumbar disc surgery  2000's  . Cystectomy  1951  . Permanent pacemaker insertion N/A 01/18/2013    Procedure: PERMANENT PACEMAKER INSERTION;  Surgeon: Thompson Grayer, MD;  Location: [facility] CATH LAB;  Service: Cardiovascular;  Laterality: N/A;  . Left heart catheterization with coronary angiogram N/A 05/03/2013    Procedure: LEFT HEART CATHETERIZATION WITH CORONARY ANGIOGRAM;  Surgeon: Peter M Martinique, MD;  Location: [facility] CATH LAB;  Service: Cardiovascular;  Laterality: N/A;  . Cardiac catheterization N/A 10/02/2015    Procedure: Right/Left Heart Cath and Coronary Angiography;  Surgeon: Sherren Mocha, MD;  Location: [facility] CV LAB;  Service: Cardiovascular;  Laterality: N/A;    Current Outpatient Prescriptions  Medication Sig Dispense Refill  . Ascorbic Acid (VITAMIN C) 1000 MG tablet Take 1,000 mg by mouth daily.    Marland Kitchen atorvastatin (LIPITOR) 10 MG tablet TAKE ONE TABLET BY MOUTH EVERY DAY 30 tablet 6  . carvedilol (COREG) 25 MG tablet Take 1 tablet (25 mg total) by mouth 2 (two) times daily. 180 tablet 3  . clopidogrel (PLAVIX) 75 MG tablet TAKE ONE TABLET BY MOUTH EVERY DAY WITH BREAKFAST 30 tablet 6  . diltiazem  (CARDIZEM CD) 360 MG 24 hr capsule Take 1 capsule (360 mg total) by mouth daily. 90 capsule 3  . diphenhydrAMINE (BENADRYL) 50 MG tablet Take 1 tablet (50 mg total) by mouth at bedtime as needed for itching. Take one tablet at 6:30 am on 10/02/15 1 tablet 0  . docusate sodium (COLACE) 100 MG capsule Take 100 mg by mouth 2 (two) times daily.    Marland Kitchen guaiFENesin (MUCINEX) 600 MG 12 hr tablet Take 600 mg by mouth daily.     . IRON, FERROUS GLUCONATE, PO Take 27 mg of iron by mouth 2 (two) times daily.    Marland Kitchen LANTUS 100 UNIT/ML injection Inject 30 Units into the skin at bedtime.     . Multiple Vitamins-Minerals (PRESERVISION AREDS) TABS Take 1 tablet by mouth daily.     . nitroGLYCERIN (NITROSTAT) 0.4 MG SL tablet Place 1  tablet (0.4 mg total) under the tongue every 5 (five) minutes x 3 doses as needed for chest pain. 25 tablet 3  . torsemide (DEMADEX) 20 MG tablet Take 1 tablet (20 mg total) by mouth 2 (two) times daily. 180 tablet 3  . warfarin (COUMADIN) 2 MG tablet Take 2-3 mg by mouth See admin instructions. PT HAS NOT BEEN TAKING FOR 2 WEEKS 10/22/15     No current facility-administered medications for this visit.   Allergies:  Colcrys; Contrast media; Penicillins; and Pradaxa   Social History: The patient  reports that he quit smoking about 46 years ago. His smoking use included Cigarettes. He has a 12 pack-year smoking history. He has never used smokeless tobacco. He reports that he does not drink alcohol or use illicit drugs.   Family History: The patient's family history includes Diabetes in his other; Hypertension in his other.   ROS:  Please see the history of present illness. Otherwise, complete review of systems is positive for decreased hearing, intermittent mood swings per family.  All other systems are reviewed and negative.   Physical Exam: VS:  BP 120/64 mmHg  Pulse 63  Ht 5\' 9"  (1.753 m)  Wt 209 lb (94.802 kg)  BMI 30.85 kg/m2  SpO2 92%, BMI Body mass index is 30.85  kg/(m^2).  Wt Readings from Last 3 Encounters:  10/22/15 209 lb (94.802 kg)  10/18/15 210 lb (95.255 kg)  10/02/15 200 lb 2 oz (90.776 kg)    Overweight elderly male, no distress. HEENT: Conjunctiva and lids normal, oropharynx clear.  Neck: Supple, elevated JVP, no carotid bruits, no thyromegaly.  Lungs: Clear to auscultation, diminished. Cardiac Irregularly irregular, no S3, indistinct PMI.  Abdomen: Nontender, bowel sounds present, no guarding or rebound.  Extremities: 1-2+ leg edema, right worse than left, distal pulses 1-2+.  Musculoskeletal: No kyphosis.  Skin: Warm and dry.  Neuropsychiatric: Alert and oriented x3, hard of hearing, affect appropriate.  ECG: I personally reviewed the prior tracing from 10/18/2015 which showed a ventricular paced rhythm with underlying atrial fibrillation/flutter.  Recent Labwork: 10/18/2015: ALT 15*; AST 17; B Natriuretic Peptide 557.0*; BUN 29*; Creatinine, Ser 1.51*; Hemoglobin 9.8*; Platelets 119*; Potassium 4.1; Sodium 139     Component Value Date/Time   CHOL 72 07/24/2014 0225   TRIG 66 07/24/2014 0225   HDL 26* 07/24/2014 0225   CHOLHDL 2.8 07/24/2014 0225   VLDL 13 07/24/2014 0225   LDLCALC 33 07/24/2014 0225    Other Studies Reviewed Today:  Echocardiogram 10/08/2015: Study Conclusions  - Left ventricle: The cavity size was at the upper limits of  normal. Wall thickness was increased in a pattern of mild LVH.  The estimated ejection fraction was 35%. There is akinesis of the  mid-apicalanteroseptal and apical myocardium. There is  hypokinesis of the apicalinferior myocardium. Features are  consistent with a pseudonormal left ventricular filling pattern,  with concomitant abnormal relaxation and increased filling  pressure (grade 2 diastolic dysfunction). - Aortic valve: Mildly to moderately calcified annulus. Trileaflet;  mildly thickened, mildly calcified leaflets. - Aortic root: The aortic root was mildly  dilated. - Mitral valve: Calcified annulus. Mildly calcified leaflets .  There was mild to moderate regurgitation. - Left atrium: The atrium was mildly dilated. - Right atrium: Central venous pressure (est): 15 mm Hg. - Tricuspid valve: There was mild regurgitation. - Pulmonary arteries: PA peak pressure: 65 mm Hg (S). - Pericardium, extracardiac: There was no pericardial effusion.  Impressions:  - Mild LVH with upper normal  LV chamber size and LVEF approximately  35%. There is akinesis of the mid to apical anteroseptal wall and  apex with hypokinesis of the inferior apical wall consistent with  ischemic cardiomyopathy. Grade 2 diastolic dysfunction with  increased LV filling pressure. Mild left atrial enlargement.  Moderate MAC with mildly calcified mitral leaflets and mild to  moderate mitral regurgitation. Mildly dilated aortic root.  Sclerotic aortic valve without stenosis. Mild tricuspid  regurgitation. Normal RV contraction. Pulmonic hypertension with  PASP estimated 65 mmHg.  Chest x-ray 10/18/2015: FINDINGS: Cardiomegaly, not significantly changed. Small bilateral pleural effusions, right greater than left with associated bibasilar opacities, likely atelectasis. Mild interstitial edema, new from prior. Implanted cardiac monitor projects over the cardiac silhouette. There is a coronary stent. No pneumothorax.  IMPRESSION: Findings consistent with mild CHF.  Assessment and Plan:  Very complex patient with multiple issues to consider -  1. Acute on chronic combined heart failure. We have just increased to Demadex to 40 mg twice daily and his weight is starting to come down by report. He remains volume overloaded and at risk for further decompensation and hospitalization. I have made this clear to the patient and his family, he does not want to be hospitalized at this time. Diuresis has been somewhat limited by progressive renal insufficiency. Recent creatinine was  down to 1.5 from 2.2. Otherwise he continues on Coreg. No ACE inhibitor or ARB with fluctuating renal insufficiency. I am scheduling a follow-up visit in one week with repeat BMET. If he does not start to further improve clinically in the interim I have asked him to seek more urgent evaluation as hospitalization for IV diuretics may be necessary. We also discussed referral to the CHF clinic for additional input, realizing that treatment options will be limited.  2. Recent anemia with hemoglobin in the 7-8 range and GI bleed based on evaluation at [facility] and subsequent referral to [facility]. Do not yet have complete records but it sounds like he had both endoscopy and colonoscopy without findings of major active bleeding process, but stigmata of bleeding from a "vessel" in the colon. There was discussion about referral to GI for capsule endoscopy although this has not been arranged officially. Our office is referring him for GI assessment as Dr. Nadara Mustard is currently out of the country. His last hemoglobin had come up to 9.8.  3. Chronic atrial fibrillation with prior history of stroke (while not anticoagulated). Very difficult situation without simple solution. I discussed his high risk of recurrent stroke if he stays off anticoagulation, although he certainly remains at risk for recurrent GI bleeding. At this point our decision after a thorough discussion is to resume Coumadin pending further GI workup. This has been conveyed to [facility] who is following his PT/INR.  4. History of CKD stage III, last creatinine 1.5.  5. Multivessel CAD status post cardiac catheterization recently revealing overall stable coronary anatomy and patent DES in the left main. Plan to continue medical therapy.  6. Mild to moderate mitral regurgitation by recent follow-up echocardiogram.  Current medicines were reviewed with the patient today.   Orders Placed This Encounter  Procedures  . Basic metabolic panel  . CBC  .  Ambulatory referral to Gastroenterology  . Ambulatory referral to Cardiology    Disposition: FU with me in 1 week.   Signed, Satira Sark, MD, [facility] 10/22/2015 11:53 AM    [place] [facility] [place], [place], [place] [address] Phone: [phone]; Fax: [phone]  [phone]  

## 2015-10-23 ENCOUNTER — Encounter: Payer: Self-pay | Admitting: Gastroenterology

## 2015-10-23 ENCOUNTER — Ambulatory Visit (INDEPENDENT_AMBULATORY_CARE_PROVIDER_SITE_OTHER): Payer: Medicare Other | Admitting: Gastroenterology

## 2015-10-23 VITALS — BP 115/64 | HR 74 | Temp 96.3°F | Ht 69.0 in | Wt 204.8 lb

## 2015-10-23 DIAGNOSIS — D649 Anemia, unspecified: Secondary | ICD-10-CM | POA: Diagnosis not present

## 2015-10-23 DIAGNOSIS — K922 Gastrointestinal hemorrhage, unspecified: Secondary | ICD-10-CM | POA: Insufficient documentation

## 2015-10-23 DIAGNOSIS — K5900 Constipation, unspecified: Secondary | ICD-10-CM | POA: Diagnosis not present

## 2015-10-23 NOTE — Assessment & Plan Note (Signed)
82 year old very pleasant male recently admitted at [facility] for GI bleeding, with colonoscopy/EGD essentially unrevealing although old blood was noted in colon. No outright source of bleeding identified. His Hgb has improved since reported values that required admission in Feb 2017. I do not know specific iron, ferritin levels or if this was ever obtained. Will need to request further documentation from [facility]. He is on Coumadin and Plavix, and cardiology is closely following him. With his complicated cardiac history, it was felt necessary to resume anticoagulation with close monitoring. At this point, he is not a candidate for a capsule study due to presence of pacemaker. He could very well have a stuttering occult small bowel bleeding. No further overt GI bleeding since hospitalization, and Hgb is stable and improving. Appears he may have an anemia of chronic disease underlying. Reports of abdominal discomfort with exertion likely related to ventral hernia. I discussed obtaining abdominal imaging in the way of CTE to evaluate for any obvious, overt small bowel culprit to occult blood loss, and this would also help evaluate the hernia. Case is complicated secondary to need for anticoagulation; he will need close monitoring now and in the future for any worsening anemia or overt GI bleeding. Obtain records now. If no iron studies performed, we will order this. Will likely order CTE first of next week.

## 2015-10-23 NOTE — Patient Instructions (Signed)
For constipation: stop taking your colace and dulcolax. Only start taking Linzess 1 capsule each morning on an empty stomach, at least 30 minutes prior to breakfast. You may have some loose stool for a few days, but it should get better. Let me know if it does not.  I need to review all of your records. You will likely need further imaging of your abdomen. We will address this next week after I have reviewed everything.

## 2015-10-23 NOTE — Assessment & Plan Note (Signed)
Start Linzess 145 mcg once daily, samples provided. Call if need prescription.

## 2015-10-23 NOTE — Progress Notes (Signed)
Primary Care Physician:  Rory Percy, MD Primary Gastroenterologist:  Dr. Oneida Alar   Chief Complaint  Patient presents with  . GI Bleeding    HPI:   [name] [name] is a 82 y.o. male presenting today at the request of Dr. Domenic Polite to establish care with gastroenterology due to recent GI bleed requiring admission at [facility]. Colonoscopy 10/12/15 by Dr. Anthony Sar with sessile polyp at cecum (tubular adenoma), moderate diverticulosis in sigmoid and descending colon, moderate amount of old blood but no active bleeding, EGD normal. He has a significant cardiac history and is on Plavix and aspirin. It was felt the benefits of anticoagulation outweighed the risk at this point, due to his complicated cardiac history.   He tells me that he had 4 units of blood at [facility]. I do not have the actual hospital reports today other than procedures notes. States his Hgb was "7.3" on admission to [place]. Had been seeing black stool "for months". Appears he was in the 11/12 range a year ago, with a drop to 8 range in Feb 2017. States he had bright red blood during the prep for the colonoscopy. No further overt GI bleeding. If he is doing something strenous, he has back pain and uncomfortable feeling in his mid abdomen/upper abdomen. Chronic constipation. Has BM about every other day with colace and dulcolax daily. Has to strain to get it started. Was not on iron while having black stool. Has been in the low 200 lb range in the past few years. His daughter is present with him, along with his wife.   Past Medical History  Diagnosis Date  . Chronic atrial fibrillation ([place])     a. Failed prior DCCV x 2; b. chronic coumadin.  . Essential hypertension, benign   . Mixed hyperlipidemia   . Chronic systolic heart failure (HCC)     a. EF 40-45% by echo 2012.  . GI bleed     December 2011 - subsequent discontinuation of Pradaxa  . Right bundle branch block   . Coronary atherosclerosis of native coronary  artery     a. s/p MI 1998;  b. Chronically occluded LAD, patent DES diagonal, subtotally occluded anomalous circumflex - failed prior PCI;  b. 04/2013 Cath/PCI: LM 95d (3.0x16 Promus Premier DES), LAD 00, D1 patent stent, LCX small 100, RCA min irregs, R->L Collats.  . Bradycardia     a. s/p SJM nanostim leadless pacemaker implanted by Dr Rayann Heman  . Ischemic cardiomyopathy     LVEF 45%  . Pacemaker 01/18/2013    STJ Nanostim Leadless pacemaker implanted by Dr Rayann Heman  . History of pneumonia 1970's  . Type II diabetes mellitus ([place])   . Anemia   . History of blood transfusion 09/2010; 12/2012  . CVA (cerebral vascular accident) ([place]) 09/2010    Suspected embolic while off anticoagulation.  . Arthritis   . CKD (chronic kidney disease) stage 3, GFR 30-59 ml/min     Past Surgical History  Procedure Laterality Date  . Pacemaker insertion  01/18/2013    STJ Nanostim Leadless pacemaker implanted by Dr Rayann Heman  . Lumbar disc surgery  2000's  . Cystectomy  1951  . Permanent pacemaker insertion N/A 01/18/2013    Procedure: PERMANENT PACEMAKER INSERTION;  Surgeon: Thompson Grayer, MD;  Location: [facility] CATH LAB;  Service: Cardiovascular;  Laterality: N/A;  . Left heart catheterization with coronary angiogram N/A 05/03/2013    Procedure: LEFT HEART CATHETERIZATION WITH CORONARY ANGIOGRAM;  Surgeon: Ander Slade  Martinique, MD;  Location: [facility] C[facility] LAB;  Service: Cardiovascular;  Laterality: N/A;  . Cardiac catheterization N/A 10/02/2015    Procedure: Right/Left Heart Cath and Coronary Angiography;  Surgeon: Sherren Mocha, MD;  Location: [place] CV LAB;  Service: Cardiovascular;  Laterality: N/A;  . Colonoscopy  Oct 12, 2015    Dr. Anthony Sar: sessile polyp at cecum (tubular adenoma), moderate diverticulosis in sigmoid and descending colon, moderate amount of old blood but no active bleeding  . Esophagogastroduodenoscopy  Oct 12, 2015    Dr. Anthony Sar: normal     Current Outpatient Prescriptions  Medication Sig Dispense  Refill  . Ascorbic Acid (VITAMIN C) 1000 MG tablet Take 1,000 mg by mouth daily.    Marland Kitchen atorvastatin (LIPITOR) 10 MG tablet TAKE ONE TABLET BY MOUTH EVERY DAY 30 tablet 6  . carvedilol (COREG) 25 MG tablet Take 1 tablet (25 mg total) by mouth 2 (two) times daily. 180 tablet 3  . clopidogrel (PLAVIX) 75 MG tablet TAKE ONE TABLET BY MOUTH EVERY DAY WITH BREAKFAST 30 tablet 6  . diltiazem (CARDIZEM CD) 360 MG 24 hr capsule Take 1 capsule (360 mg total) by mouth daily. 90 capsule 3  . diphenhydrAMINE (BENADRYL) 50 MG tablet Take 1 tablet (50 mg total) by mouth at bedtime as needed for itching. Take one tablet at 6:30 am on 10/02/15 1 tablet 0  . docusate sodium (COLACE) 100 MG capsule Take 100 mg by mouth 2 (two) times daily.    Marland Kitchen guaiFENesin (MUCINEX) 600 MG 12 hr tablet Take 600 mg by mouth daily.     . IRON, FERROUS GLUCONATE, PO Take 27 mg of iron by mouth 2 (two) times daily.    Marland Kitchen LANTUS 100 UNIT/ML injection Inject 30 Units into the skin at bedtime.     . Multiple Vitamins-Minerals (PRESERVISION AREDS) TABS Take 1 tablet by mouth daily.     . nitroGLYCERIN (NITROSTAT) 0.4 MG SL tablet Place 1 tablet (0.4 mg total) under the tongue every 5 (five) minutes x 3 doses as needed for chest pain. 25 tablet 3  . torsemide (DEMADEX) 20 MG tablet Take 1 tablet (20 mg total) by mouth 2 (two) times daily. 180 tablet 3  . warfarin (COUMADIN) 2 MG tablet Take 2-3 mg by mouth See admin instructions. PT HAS NOT BEEN TAKING FOR 2 WEEKS 10/22/15     No current facility-administered medications for this visit.    Allergies as of 10/23/2015 - Review Complete 10/23/2015  Allergen Reaction Noted  . Colcrys [colchicine]  02/19/2013  . Contrast media [iodinated diagnostic agents] Other (See Comments) 12/03/2012  . Penicillins Swelling   . Pradaxa [dabigatran etexilate mesylate] Other (See Comments) 10/09/2014    Family History  Problem Relation Age of Onset  . Diabetes Other   . Hypertension Other   . Colon  cancer Neg Hx     Social History   Social History  . Marital Status: Married    Spouse Name: [name]  . Number of Children: N/A  . Years of Education: N/A   Occupational History  . RETIRED   .     Social History Main Topics  . Smoking status: Former Smoker -- 0.30 packs/day for 40 years    Types: Cigarettes    Quit date: 08/15/1969  . Smokeless tobacco: Never Used  . Alcohol Use: No  . Drug Use: No  . Sexual Activity: No   Other Topics Concern  . Not on file   Social History Narrative  Review of Systems: Gen: see HPI  CV: occasionally feels palpitations Resp: +DOE GI: see HPI  GU : + urinary hesitancy MS: +joint pain  Derm: Denies rash, itching, dry skin Psych: Denies depression, anxiety, memory loss, and confusion Heme: see HPI   Physical Exam: BP 115/64 mmHg  Pulse 74  Temp(Src) 96.3 F (35.7 C) (Oral)  Ht 5\' 9"  (1.753 m)  Wt 204 lb 12.8 oz (92.897 kg)  BMI 30.23 kg/m2 General:   Alert and oriented. Pleasant and cooperative. Well-nourished and well-developed.  Head:  Normocephalic and atraumatic. Eyes:  Without icterus, sclera clear and conjunctiva pink.  Ears:  Normal auditory acuity. Nose:  No deformity, discharge,  or lesions. Mouth:  No deformity or lesions, oral mucosa pink.  Lungs:  Clear to auscultation bilaterally but diminished in bases  Heart:  S1, S2 present without murmurs  Abdomen:  +BS, soft, non-tender.Large AP diameter. Liver margin smooth and palpable 2-3 fingerbreadths below right costal margin, ventral hernia noted/umbilical hernia Rectal:  Deferred  Msk:  Symmetrical without gross deformities. Normal posture. Extremities:  2-3+ pitting edema Neurologic:  Alert and  oriented x4;  grossly normal neurologically. Psych:  Alert and cooperative. Normal mood and affect.   Lab Results  Component Value Date   WBC 9.9 10/18/2015   HGB 9.8* 10/18/2015   HCT 32.0* 10/18/2015   MCV 89.9 10/18/2015   PLT 119* 10/18/2015   Lab Results    Component Value Date   ALT 15* 10/18/2015   AST 17 10/18/2015   ALKPHOS 109 10/18/2015   BILITOT 0.9 10/18/2015   Lab Results  Component Value Date   CREATININE 1.51* 10/18/2015   BUN 29* 10/18/2015   NA 139 10/18/2015   K 4.1 10/18/2015   CL 104 10/18/2015   CO2 27 10/18/2015

## 2015-10-26 DIAGNOSIS — I482 Chronic atrial fibrillation: Secondary | ICD-10-CM | POA: Diagnosis not present

## 2015-10-26 NOTE — Progress Notes (Addendum)
hgb 9.1 on discharge 10/09/15. Ferritin 30, Iron 26.

## 2015-10-26 NOTE — Progress Notes (Signed)
cc'ed to pcp °

## 2015-10-27 NOTE — Progress Notes (Signed)
Reviewed records from [place]. Has patient ever had IV dye for a CT? Contrast media is listed on his allergies. Did he have an anaphylactic reaction with this? If so, we could not do a CT enterography. If it is only itching or something of that nature, I would premedicate him. We need to find that out before I recommend CT enterography.

## 2015-10-27 NOTE — Progress Notes (Signed)
I called pt and he said he had dye when he had a stent put in and had KIDNEY FAILURE. Please advise!

## 2015-10-28 DIAGNOSIS — R0602 Shortness of breath: Secondary | ICD-10-CM | POA: Diagnosis not present

## 2015-10-28 DIAGNOSIS — I5043 Acute on chronic combined systolic (congestive) and diastolic (congestive) heart failure: Secondary | ICD-10-CM | POA: Diagnosis not present

## 2015-10-29 ENCOUNTER — Ambulatory Visit (INDEPENDENT_AMBULATORY_CARE_PROVIDER_SITE_OTHER): Payer: Medicare Other | Admitting: Cardiology

## 2015-10-29 ENCOUNTER — Ambulatory Visit: Payer: Medicare Other | Admitting: Cardiology

## 2015-10-29 ENCOUNTER — Encounter: Payer: Self-pay | Admitting: Cardiology

## 2015-10-29 VITALS — BP 120/72 | HR 71 | Ht 69.0 in | Wt 198.2 lb

## 2015-10-29 DIAGNOSIS — Z79899 Other long term (current) drug therapy: Secondary | ICD-10-CM | POA: Diagnosis not present

## 2015-10-29 DIAGNOSIS — I482 Chronic atrial fibrillation, unspecified: Secondary | ICD-10-CM

## 2015-10-29 DIAGNOSIS — Z8719 Personal history of other diseases of the digestive system: Secondary | ICD-10-CM

## 2015-10-29 DIAGNOSIS — I255 Ischemic cardiomyopathy: Secondary | ICD-10-CM

## 2015-10-29 DIAGNOSIS — N189 Chronic kidney disease, unspecified: Secondary | ICD-10-CM | POA: Diagnosis not present

## 2015-10-29 DIAGNOSIS — R001 Bradycardia, unspecified: Secondary | ICD-10-CM

## 2015-10-29 DIAGNOSIS — I2510 Atherosclerotic heart disease of native coronary artery without angina pectoris: Secondary | ICD-10-CM | POA: Diagnosis not present

## 2015-10-29 DIAGNOSIS — I5042 Chronic combined systolic (congestive) and diastolic (congestive) heart failure: Secondary | ICD-10-CM

## 2015-10-29 DIAGNOSIS — K922 Gastrointestinal hemorrhage, unspecified: Secondary | ICD-10-CM | POA: Diagnosis not present

## 2015-10-29 DIAGNOSIS — D509 Iron deficiency anemia, unspecified: Secondary | ICD-10-CM | POA: Diagnosis not present

## 2015-10-29 DIAGNOSIS — E1165 Type 2 diabetes mellitus with hyperglycemia: Secondary | ICD-10-CM | POA: Diagnosis not present

## 2015-10-29 NOTE — Patient Instructions (Signed)
Medication Instructions:  Take demodex ( 2 tablets) 40 mg total - two times daily   Labwork: Your physician recommends that you return for lab work in: Just before next visit in 3 weeks  CBC BMET   Testing/Procedures: NONE  Follow-Up: Your physician recommends that you schedule a follow-up appointment in: 3 WEEKS IN [place]    Any Other Special Instructions Will Be Listed Below (If Applicable).  Thanks for choosing [facility]!!!     If you need a refill on your cardiac medications before your next appointment, please call your pharmacy.

## 2015-10-29 NOTE — Progress Notes (Signed)
Cardiology Office Note  Date: 10/29/2015   ID: [name] [name], DOB 03-Dec-1928, MRN [id number]  PCP: Rory Percy, MD  Primary Cardiologist: [name] Lesches, MD   Chief Complaint  Patient presents with  . Cardiomyopathy  . Coronary Artery Disease    History of Present Illness: [name] [name] is a medically complex 82 y.o. male presenting for a 1 week follow-up visit with his daughter. History is detailed in the prior note. In the interim he had evaluation by Ms. Sams NP in the GI clinic, plans are not to pursue capsule endoscopy due to presence of pacemaker. He has had no obvious melena since resumption of Coumadin. He states that his weight has come down since we increased Demadex, and he is feeling somewhat better. Still reports NYHA class 2-3 dyspnea based on level of activity. I have already made a referral to the Heart Failure clinic which is pending at this time.  Weight is down 6-7 pounds since last encounter. Fortunately, renal function has been stable with creatinine 1.5 and normal potassium.  Current cardiac regimen includes Coreg, Demadex, Lipitor, Cardizem CD, Plavix, Coumadin. He is not on ACE inhibitor or ARB with history of fluctuating renal insufficiency.  He states that he will be seeing Dr. Nadara [name] for visit later this afternoon.  Past Medical History  Diagnosis Date  . Chronic atrial fibrillation ([place])     a. Failed prior DCCV x 2; b. chronic coumadin.  . Essential hypertension, benign   . Mixed hyperlipidemia   . Chronic systolic heart failure (HCC)     a. EF 40-45% by echo 2012.  . GI bleed     December 2011 - subsequent discontinuation of Pradaxa  . Right bundle branch block   . Coronary atherosclerosis of native coronary artery     a. s/p MI 1998;  b. Chronically occluded LAD, patent DES diagonal, subtotally occluded anomalous circumflex - failed prior PCI;  b. 04/2013 Cath/PCI: LM 95d (3.0x16 Promus Premier DES), LAD 00, D1 patent stent, LCX small 100,  RCA min irregs, R->L Collats.  . Bradycardia     a. s/p SJM nanostim leadless pacemaker implanted by Dr [name] [name]  . Ischemic cardiomyopathy     LVEF 45%  . Pacemaker 01/18/2013    STJ Nanostim Leadless pacemaker implanted by Dr [name] [name]  . History of pneumonia 1970's  . Type II diabetes mellitus ([place])   . Anemia   . History of blood transfusion 09/2010; 12/2012  . CVA (cerebral vascular accident) ([place]) 09/2010    Suspected embolic while off anticoagulation.  . Arthritis   . CKD (chronic kidney disease) stage 3, GFR 30-59 ml/min     Past Surgical History  Procedure Laterality Date  . Pacemaker insertion  01/18/2013    STJ Nanostim Leadless pacemaker implanted by Dr [name] [name]  . Lumbar disc surgery  2000's  . Cystectomy  1951  . Permanent pacemaker insertion N/A 01/18/2013    Procedure: PERMANENT PACEMAKER INSERTION;  Surgeon: Thompson Grayer, MD;  Location: [facility] CATH LAB;  Service: Cardiovascular;  Laterality: N/A;  . Left heart catheterization with coronary angiogram N/A 05/03/2013    Procedure: LEFT HEART CATHETERIZATION WITH CORONARY ANGIOGRAM;  Surgeon: [name] M Martinique, MD;  Location: [facility] CATH LAB;  Service: Cardiovascular;  Laterality: N/A;  . Cardiac catheterization N/A 10/02/2015    Procedure: Right/Left Heart Cath and Coronary Angiography;  Surgeon: [name] Mocha, MD;  Location: [place] CV LAB;  Service: Cardiovascular;  Laterality: N/A;  . Colonoscopy  Oct 12, 2015    Dr. Anthony [name]: sessile polyp at cecum (tubular adenoma), moderate diverticulosis in sigmoid and descending colon, moderate amount of old blood but no active bleeding  . Esophagogastroduodenoscopy  Oct 12, 2015    Dr. Anthony [name]: normal     Current Outpatient Prescriptions  Medication Sig Dispense Refill  . Ascorbic Acid (VITAMIN C) 1000 MG tablet Take 1,000 mg by mouth daily.    Marland Kitchen atorvastatin (LIPITOR) 10 MG tablet TAKE ONE TABLET BY MOUTH EVERY DAY 30 tablet 6  . carvedilol (COREG) 25 MG tablet Take 1 tablet (25 mg total) by  mouth 2 (two) times daily. 180 tablet 3  . clopidogrel (PLAVIX) 75 MG tablet TAKE ONE TABLET BY MOUTH EVERY DAY WITH BREAKFAST 30 tablet 6  . diltiazem (CARDIZEM CD) 360 MG 24 hr capsule Take 1 capsule (360 mg total) by mouth daily. 90 capsule 3  . guaiFENesin (MUCINEX) 600 MG 12 hr tablet Take 600 mg by mouth daily.     . IRON, FERROUS GLUCONATE, PO Take 27 mg of iron by mouth 2 (two) times daily.    Marland Kitchen LANTUS 100 UNIT/ML injection Inject 30 Units into the skin at bedtime.     . nitroGLYCERIN (NITROSTAT) 0.4 MG SL tablet Place 1 tablet (0.4 mg total) under the tongue every 5 (five) minutes x 3 doses as needed for chest pain. 25 tablet 3  . torsemide (DEMADEX) 20 MG tablet Take 1 tablet (20 mg total) by mouth 2 (two) times daily. (Patient taking differently: Take 20 mg by mouth 2 (two) times daily. Take 2 tablets ( 40 mg total) two times daily) 180 tablet 3  . warfarin (COUMADIN) 2 MG tablet Take 2-3 mg by mouth See admin instructions. PT HAS NOT BEEN TAKING FOR 2 WEEKS 10/22/15     No current facility-administered medications for this visit.   Allergies:  Colcrys; Contrast media; Penicillins; and Pradaxa   Social History: The patient  reports that he quit smoking about 46 years ago. His smoking use included Cigarettes. He has a 12 pack-year smoking history. He has never used smokeless tobacco. He reports that he does not drink alcohol or use illicit drugs.   ROS:  Please see the history of present illness. Otherwise, complete review of systems is positive for chronic dyspnea on exertion, chronic back pain.  All other systems are reviewed and negative.   Physical Exam: VS:  BP 120/72 mmHg  Pulse 71  Ht 5\' 9"  (1.753 m)  Wt 198 lb 3.2 oz (89.903 kg)  BMI 29.26 kg/m2  SpO2 98%, BMI Body mass index is 29.26 kg/(m^2).  Wt Readings from Last 3 Encounters:  10/29/15 198 lb 3.2 oz (89.903 kg)  10/23/15 204 lb 12.8 oz (92.897 kg)  10/22/15 209 lb (94.802 kg)    Overweight elderly male, no  distress. HEENT: Conjunctiva and lids normal, oropharynx clear.  Neck: Supple, elevated JVP, no carotid bruits, no thyromegaly.  Lungs: Clear to auscultation, diminished. Cardiac Irregularly irregular, no S3, indistinct PMI.  Abdomen: Nontender, bowel sounds present, no guarding or rebound.  Extremities: 1-2+ leg edema, right worse than left, distal pulses 1-2+.   ECG: I personally reviewed the prior tracing from 10/18/2015 which showed a ventricular paced rhythm with underlying atrial fibrillation/flutter.  Recent Labwork: 10/18/2015: ALT 15*; AST 17; B Natriuretic Peptide 557.0*; BUN 29*; Creatinine, Ser 1.51*; Hemoglobin 9.8*; Platelets 119*; Potassium 4.1; Sodium 139     Component Value Date/Time   CHOL 72 07/24/2014 0225   TRIG 66 07/24/2014  0225   HDL 26* 07/24/2014 0225   CHOLHDL 2.8 07/24/2014 0225   VLDL 13 07/24/2014 0225   LDLCALC 33 07/24/2014 0225    Other Studies Reviewed Today:  Echocardiogram 10/08/2015: Study Conclusions  - Left ventricle: The cavity size was at the upper limits of  normal. Wall thickness was increased in a pattern of mild LVH.  The estimated ejection fraction was 35%. There is akinesis of the  mid-apicalanteroseptal and apical myocardium. There is  hypokinesis of the apicalinferior myocardium. Features are  consistent with a pseudonormal left ventricular filling pattern,  with concomitant abnormal relaxation and increased filling  pressure (grade 2 diastolic dysfunction). - Aortic valve: Mildly to moderately calcified annulus. Trileaflet;  mildly thickened, mildly calcified leaflets. - Aortic root: The aortic root was mildly dilated. - Mitral valve: Calcified annulus. Mildly calcified leaflets .  There was mild to moderate regurgitation. - Left atrium: The atrium was mildly dilated. - Right atrium: Central venous pressure (est): 15 mm Hg. - Tricuspid valve: There was mild regurgitation. - Pulmonary arteries: PA peak pressure: 65  mm Hg (S). - Pericardium, extracardiac: There was no pericardial effusion.  Impressions:  - Mild LVH with upper normal LV chamber size and LVEF approximately  35%. There is akinesis of the mid to apical anteroseptal wall and  apex with hypokinesis of the inferior apical wall consistent with  ischemic cardiomyopathy. Grade 2 diastolic dysfunction with  increased LV filling pressure. Mild left atrial enlargement.  Moderate MAC with mildly calcified mitral leaflets and mild to  moderate mitral regurgitation. Mildly dilated aortic root.  Sclerotic aortic valve without stenosis. Mild tricuspid  regurgitation. Normal RV contraction. Pulmonic hypertension with  PASP estimated 65 mmHg.  Assessment and Plan:  1. Chronic combined heart failure with ischemic cardiomyopathy, LVEF approximately 35% and secondary pulmonary hypertension. He has improved somewhat in terms of volume status with decreasing weight, and we will keep him on Demadex at 40 mg twice daily for now. I have already referred him to the Heart Failure clinic. No ACE inhibitor or ARB with fluctuating renal insufficiency. Otherwise on Coreg (diltiazem CD is for rate control of atrial fibrillation). Will consider adding low-dose hydralazine and nitrate next if tolerated.  2. Chronic atrial fibrillation with previous history of stroke. As explained in the prior note, elected to start him back on Coumadin in spite of recent GI bleed given very high stroke risk (had a stroke when temporarily off anticoagulation in the past). He is tolerating this so far. It looks like he has been compliant with Coreg and diltiazem CD based on adequate heart rate control, although this has been a problem in the past.  3. History of symptomatic bradycardia and second-degree heart block status post [employer] lead list pacemaker by Dr. Rayann [name].  4. Patient is now established with [facility], saw Ms. Sams NP just recently on  March 10. No plan for capsule endoscopy at this time. Recent EGD and colonoscopy without active bleeding site but evidence of old blood in the colon. Additional imaging studies are being considered.  5. Multivessel CAD with recent cardiac catheterization demonstrating stable coronary anatomy including patent DES in the left main. He continues on long-term Plavix.  6. Mild to moderate mitral regurgitation by recent follow-up echocardiogram.  Current medicines were reviewed with the patient today.   Orders Placed This Encounter  Procedures  . CBC with Differential  . Basic Metabolic Panel (BMET)    Disposition: FU with me in 3 weeks.  Signed, Satira Sark, MD, [facility] 10/29/2015 10:34 AM    [place] [facility] [place]. [address], [place], [place] [address] Phone: [phone]; Fax: [phone]

## 2015-11-04 ENCOUNTER — Telehealth (HOSPITAL_COMMUNITY): Payer: Self-pay | Admitting: Vascular Surgery

## 2015-11-04 ENCOUNTER — Ambulatory Visit (HOSPITAL_COMMUNITY)
Admission: RE | Admit: 2015-11-04 | Discharge: 2015-11-04 | Disposition: A | Discharge status: Home / Self Care | Payer: Medicare Other | Source: Ambulatory Visit | Attending: Cardiology | Admitting: Cardiology

## 2015-11-04 ENCOUNTER — Encounter (HOSPITAL_COMMUNITY): Payer: Self-pay

## 2015-11-04 VITALS — BP 112/61 | HR 80 | Resp 20 | Wt 196.2 lb

## 2015-11-04 DIAGNOSIS — I252 Old myocardial infarction: Secondary | ICD-10-CM | POA: Insufficient documentation

## 2015-11-04 DIAGNOSIS — Z950 Presence of cardiac pacemaker: Secondary | ICD-10-CM | POA: Diagnosis not present

## 2015-11-04 DIAGNOSIS — N183 Chronic kidney disease, stage 3 unspecified: Secondary | ICD-10-CM

## 2015-11-04 DIAGNOSIS — I482 Chronic atrial fibrillation, unspecified: Secondary | ICD-10-CM

## 2015-11-04 DIAGNOSIS — I5022 Chronic systolic (congestive) heart failure: Secondary | ICD-10-CM | POA: Diagnosis not present

## 2015-11-04 DIAGNOSIS — Z955 Presence of coronary angioplasty implant and graft: Secondary | ICD-10-CM | POA: Insufficient documentation

## 2015-11-04 DIAGNOSIS — I5042 Chronic combined systolic (congestive) and diastolic (congestive) heart failure: Secondary | ICD-10-CM

## 2015-11-04 DIAGNOSIS — I442 Atrioventricular block, complete: Secondary | ICD-10-CM | POA: Diagnosis not present

## 2015-11-04 DIAGNOSIS — I2510 Atherosclerotic heart disease of native coronary artery without angina pectoris: Secondary | ICD-10-CM | POA: Diagnosis not present

## 2015-11-04 DIAGNOSIS — Z8673 Personal history of transient ischemic attack (TIA), and cerebral infarction without residual deficits: Secondary | ICD-10-CM | POA: Insufficient documentation

## 2015-11-04 DIAGNOSIS — Z87891 Personal history of nicotine dependence: Secondary | ICD-10-CM | POA: Diagnosis not present

## 2015-11-04 DIAGNOSIS — Z79899 Other long term (current) drug therapy: Secondary | ICD-10-CM | POA: Diagnosis not present

## 2015-11-04 DIAGNOSIS — I130 Hypertensive heart and chronic kidney disease with heart failure and stage 1 through stage 4 chronic kidney disease, or unspecified chronic kidney disease: Secondary | ICD-10-CM | POA: Diagnosis not present

## 2015-11-04 DIAGNOSIS — I255 Ischemic cardiomyopathy: Secondary | ICD-10-CM | POA: Diagnosis not present

## 2015-11-04 DIAGNOSIS — Z7901 Long term (current) use of anticoagulants: Secondary | ICD-10-CM | POA: Diagnosis not present

## 2015-11-04 DIAGNOSIS — R001 Bradycardia, unspecified: Secondary | ICD-10-CM | POA: Insufficient documentation

## 2015-11-04 DIAGNOSIS — R0683 Snoring: Secondary | ICD-10-CM | POA: Diagnosis not present

## 2015-11-04 MED ORDER — TORSEMIDE 20 MG PO TABS: ORAL_TABLET | ORAL | Status: DC

## 2015-11-04 MED ORDER — SPIRONOLACTONE 25 MG PO TABS: 12.5000 mg | ORAL_TABLET | Freq: Every day | ORAL | Status: DC

## 2015-11-04 MED ORDER — DILTIAZEM HCL ER COATED BEADS 240 MG PO CP24: 240.0000 mg | ORAL_CAPSULE | Freq: Every day | ORAL | Status: DC

## 2015-11-04 NOTE — Patient Instructions (Signed)
Decrease Diltiazem to 240 mg daily  Increase Torsemide to 60 mg (3 tabs) in AM and 40 mg (2 tabs) in PM  Start Spironolactone 12.5 mg (1/2 tab) daily  Stop Plavix  Labs in 1 week  Your physician has recommended that you have a sleep study. This test records several body functions during sleep, including: brain activity, eye movement, oxygen and carbon dioxide blood levels, heart rate and rhythm, breathing rate and rhythm, the flow of air through your mouth and nose, snoring, body muscle movements, and chest and belly movement.  Your physician recommends that you schedule a follow-up appointment in: 2 weeks

## 2015-11-04 NOTE — Progress Notes (Signed)
Patient ID: [name] [name], male   DOB: 1929/03/10, 82 y.o.   MRN: [id number] PCP: Dr. Nadara Mustard Cardiology: Dr. Domenic Polite HF Cardiology: Dr. Aundra Dubin  82 yo with history of chronic atrial fibrillation, coronary artery disease, chronic systolic CHF/ischemic cardiomyopathy, GI bleeding, and CKD presents for CHF clinic evaluation.  Patient has failed DCCV x 2 in the past and remains in atrial fibrillation with controlled rate today.  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  The RCA was patent and a left main stent was patent.  He had had left main stent placed back in 9/14 when he presented with NSTEMI and 95% distal LM stenosis.  Last echo in 3/17 showed EF 35-40% with regional wall motion abnormalities.  Of note, he has a Optician, dispensing) placed because of symptomatic bradycardia.   Most recently, patient was admitted to [facility] in 2/17 with GI bleed.  He had 4 units PRBCs.  He has had colonoscopy and EGD without localization of bleeding source.  He remains on Plavix and warfarin today.  No further BRBPR or melena.   Patient is short of breath with moderate exertion. He has bendopnea.  He is short of breath carrying a load such as a bag of groceries. Dyspnea after walking about a block.  He is not very active as he is also limited by low back pain.  No orthopnea.  His torsemide was increased not that long ago.  This helped his symptoms and resolved orthopnea. No chest pain.  He does not feel palpitations. He gets sleepy during the day and snores loudly per his wife.    ECG (3/17): atrial fibrillation, v-paced  Labs (3/17): K 4.4, creatinine 1.46, HCT 32  PMH: 1. Atrial fibrillation: Chronic. He has failed DCCV x 2.  He is on warfarin.  2. H/o GI bleeding: He was admitted to [facility] in 2/17 and had 4 units PRBCs.  He had EGD and colonscopy without identification of bleeding site.   3. CKD stage III 4. HTN 5.  CAD: MI in 1998.  Has anomalous left LCx off right cusp. - NSTEMI (9/14): 95% distal LM (supplied moderate D1), CTO LAD after D1, RCA patent, small anomalous LCx off right cusp was occluded => DES to LM. - LHC (2/17) with no change to CTO pLAD after D1 or CTO LCx.  RCA was patent and LM stent was patent.  No changes.  6. Chronic systolic CHF: Ischemic cardiomyopathy.  - Echo (3/17) with EF 35%, anteroseptal and apical akinesis, PASP 65 mmHg.  - RHC (2/17) with mean RA 9, PA 52/15 mean 31, mean PCWP 20, CI 2.89, PVR 1.8 WU.  7. Symptomatic bradycardia: S/p St Jude leadless PPM.   8. 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  9. Low back pain  SH: Lives in [place] with wife.  Remote tobacco use.   Family History  Problem Relation Age of Onset  . Diabetes Other   . Hypertension Other   . Colon cancer Neg Hx    ROS: All systems reviewed and negative except as per HPI.   Current Outpatient Prescriptions  Medication Sig Dispense Refill  . Ascorbic Acid (VITAMIN C) 1000 MG tablet Take 1,000 mg by mouth daily.    Marland Kitchen atorvastatin (LIPITOR) 10 MG tablet TAKE ONE TABLET BY MOUTH EVERY DAY 30 tablet 6  . carvedilol (COREG) 25 MG tablet Take 1 tablet (25 mg total) by  mouth 2 (two) times daily. 180 tablet 3  . diltiazem (CARDIZEM CD) 240 MG 24 hr capsule Take 1 capsule (240 mg total) by mouth daily. 30 capsule 3  . guaiFENesin (MUCINEX) 600 MG 12 hr tablet Take 600 mg by mouth daily.     . IRON, FERROUS GLUCONATE, PO Take 27 mg of iron by mouth 2 (two) times daily.    Marland Kitchen LANTUS 100 UNIT/ML injection Inject 30 Units into the skin at bedtime.     . Linaclotide (LINZESS) 145 MCG CAPS capsule Take 145 mcg by mouth daily.    . nitroGLYCERIN (NITROSTAT) 0.4 MG SL tablet Place 1 tablet (0.4 mg total) under the tongue every 5 (five) minutes x 3 doses as needed for chest pain. 25 tablet 3  . torsemide (DEMADEX) 20 MG tablet Take 3 tabs in AM and 2 tabs in PM 150 tablet 3  . warfarin (COUMADIN) 2 MG tablet Take 2-3 mg by  mouth See admin instructions. PT HAS NOT BEEN TAKING FOR 2 WEEKS 10/22/15    . spironolactone (ALDACTONE) 25 MG tablet Take 0.5 tablets (12.5 mg total) by mouth daily. 15 tablet 3   No current facility-administered medications for this encounter.   BP 112/61 mmHg  Pulse 80  Resp 20  Wt 196 lb 4 oz (89.018 kg)  SpO2 98% General: NAD Neck: JVP 8-9 cm, no thyromegaly or thyroid nodule.  Lungs: Clear to auscultation bilaterally with normal respiratory effort. CV: Nondisplaced PMI.  Heart regular S1/S2, no S3/S4, no murmur.  1+ edema to knees bilaterally.  No carotid bruit.  Normal pedal pulses.  Abdomen: Soft, nontender, no hepatosplenomegaly, no distention.  Skin: Intact without lesions or rashes.  Neurologic: Alert and oriented x 3.  Psych: Normal affect. Extremities: No clubbing or cyanosis.  HEENT: Normal.   Assessment/Plan: 1. CAD: Recent cath in 2/17 with patent LM stent, chronic occlusion of pLAD and small anomalous LCx.  No chest pain.  - Continue statin.  - Given recent GI bleeding, think we should probably stop his Plavix.  LM stent was in 9/14 so has been several years ago.  He need long-term warfarin.  Would continue warfarin in absence of Plavix.  Will check with Dr Domenic Polite to make sure that he agrees.  2. Atrial fibrillation: Chronic, failed DCCV x 2 in the past.  I suspect that he would be difficult to cardiovert and keep in NSR as it appears that he has been in atrial fibrillation since at least 2013. - He will continue warfarin. Would bridge with Lovenox if he has to come off warfarin in the future given history of CVA.  - As above, given recent GI bleeding, would consider maintaining him on warfarin alone rather than warfarin + Plavix as he is 2.5 years out from stent.  - HR controlled today.  Not ideal for him to be on diltiazem CD.  I will continue Coreg and have him decrease diltiazem CD to 240 mg daily.  If he tolerates this without uncontrolled HR, will continue to  decrease diltiazem to try to get him off it. Could potentially use digoxin in the future as long as creatinine remains stable.  3. H/o GI bleeding: He had EGD and c-scope without identification of source.  He was given 4 units PRBCs at [facility] in 2/17. No further melena or BRBPR.   4. Chronic systolic CHF: Ischemic cardiomyopathy, EF 35% on 3/17 echo.  Elevated PA systolic pressure by echo likely reflective of elevated left atrial pressure.  NYHA class III symptoms.  Limited in his daily activities.  Dyspnea improved with recent torsemide increase.  He is volume overloaded on exam today but not markedly so.   - Increase torsemide to 60 qam/40 qpm.  - Continue Coreg 25 bid.  - Add spironolactone 12.5 mg daily.  - BMET in 1 week.  - Next step would consider addition of hydralazine/nitrates. Would probably hold off on ACEI for now with significant CKD.  5. Symptomatic bradycardia: He has a Pharmacist, hospital PPM.  Most recent echo showed v-pacing.  Will need to check with Dr Rayann Heman about this particular PPM => if it is pacing RV, frequent pacing is not ideal.  6. OSA: Strong suspicion for OSA.  I will arrange sleep study.  Followup in 1 week.   Loralie Champagne 11/04/2015

## 2015-11-04 NOTE — Progress Notes (Signed)
Please let patient know I need to review this with radiology about the best imaging to do. Please let him know I will be out of the office the rest of the week but will have an answer the first of next week.

## 2015-11-04 NOTE — Telephone Encounter (Signed)
Left pt detailed message date time of sleep study @ [facility]

## 2015-11-06 NOTE — Progress Notes (Signed)
Pt is aware that [name] [name] will discuss with radiologist and be in touch early next week.

## 2015-11-10 NOTE — Progress Notes (Addendum)
This is a tough situation, because he has renal disease and I'm afraid contrast will not be a good choice for him, as his renal function seems to fluctuate. Coupled with his reports of problems with his kidneys after dye previously, I feel this should be avoided. I did speak with Dr. Thornton Papas, who stated a reduced dose could be used for the CT enterography and MR enterography. However, I feel we should avoid this now unless absolutely necessary. He likely had a diverticular bleed but unable to exclude a small bowel source. Let's recheck CBC, iron, and ferritin in 4-6 weeks. He likely has a hernia as well, but even with confirming this with imaging, he would not be a surgical candidate. It's best to avoid any heavy lifting, straining, pushing, pulling. He has multiple issues going on, and we will follow him closely for now. Let's have him see me in 4-6 weeks and I will order CBC, iron, ferritin, TIBC then.

## 2015-11-10 NOTE — Progress Notes (Signed)
LMOM to call.

## 2015-11-11 ENCOUNTER — Telehealth: Payer: Self-pay | Admitting: Gastroenterology

## 2015-11-11 DIAGNOSIS — I482 Chronic atrial fibrillation: Secondary | ICD-10-CM | POA: Diagnosis not present

## 2015-11-11 MED ORDER — LINACLOTIDE 145 MCG PO CAPS: 145.0000 ug | ORAL_CAPSULE | Freq: Every day | ORAL | Status: DC

## 2015-11-11 NOTE — Addendum Note (Signed)
Addended by: Orvil Feil on: 11/11/2015 04:31 PM   Modules accepted: Orders

## 2015-11-11 NOTE — Telephone Encounter (Signed)
Linzess sent to pharmacy.   I have copied here what I put on the office note addendum. Doris, it looks like you already tried to call him:   This is a tough situation, because he has renal disease and I'm afraid contrast will not be a good choice for him, as his renal function seems to fluctuate. Coupled with his reports of problems with his kidneys after dye previously, I feel this should be avoided. I did speak with Dr. Thornton Papas, who stated a reduced dose could be used for the CT enterography and MR enterography. However, I feel we should avoid this now unless absolutely necessary. He likely had a diverticular bleed but unable to exclude a small bowel source. Let's recheck CBC, iron, and ferritin in 4-6 weeks. He likely has a hernia as well, but even with confirming this with imaging, he would not be a surgical candidate. It's best to avoid any heavy lifting, straining, pushing, pulling. He has multiple issues going on, and we will follow him closely for now. Let's have him see me in 4-6 weeks and I will order CBC, iron, ferritin, TIBC then.

## 2015-11-11 NOTE — Telephone Encounter (Signed)
Routing to [employer] for [employer] for [employer].  Please advise if pt needs imaging, testing, ETC. It looks like he may have needed and CTE

## 2015-11-11 NOTE — Telephone Encounter (Signed)
NEEDS LINZESS CALLED INTO HIS PHARMACY  [facility] AND NEEDS TO KNOW IF HE WAS TO HAVE ANYTHING SCHEDULED LIKE A TEST AND WANTS TO KNOW WHEN HE SHOULD HAVE A FOLLOW UP APPOINTMENT    [phone]

## 2015-11-12 DIAGNOSIS — I5022 Chronic systolic (congestive) heart failure: Secondary | ICD-10-CM | POA: Diagnosis not present

## 2015-11-12 NOTE — Telephone Encounter (Signed)
LMOM to call.

## 2015-11-13 ENCOUNTER — Telehealth (HOSPITAL_COMMUNITY): Payer: Self-pay | Admitting: Cardiology

## 2015-11-13 MED ORDER — TORSEMIDE 20 MG PO TABS: 40.0000 mg | ORAL_TABLET | Freq: Two times a day (BID) | ORAL | Status: DC

## 2015-11-13 NOTE — Progress Notes (Signed)
Pt is aware and appt is being scheduled by Manuela Schwartz.

## 2015-11-13 NOTE — Telephone Encounter (Signed)
Pt is aware of the prescription and to avoid heavy lifting etc.  Called transferred to Manuela Schwartz to schedule appt with Vicente Males in 4-6 weeks.

## 2015-11-13 NOTE — Telephone Encounter (Signed)
Abnormal labs received Labs drawn 11/12/15 Cr 2.10 k 5.0  Per Vo Darrick Grinder, NP Patient should hold torsemide and spironolactone for 2 days Resume torsemide at 40 mg BID  Repeat labs x 1 week  Patient aware and repeated instructions back multiple times

## 2015-11-16 NOTE — Progress Notes (Signed)
See telephone note of 11/11/2015.

## 2015-11-19 DIAGNOSIS — Z79899 Other long term (current) drug therapy: Secondary | ICD-10-CM | POA: Diagnosis not present

## 2015-11-19 DIAGNOSIS — E785 Hyperlipidemia, unspecified: Secondary | ICD-10-CM | POA: Diagnosis not present

## 2015-11-20 ENCOUNTER — Telehealth: Payer: Self-pay | Admitting: Gastroenterology

## 2015-11-20 ENCOUNTER — Encounter: Payer: Medicare Other | Admitting: Cardiology

## 2015-11-20 ENCOUNTER — Other Ambulatory Visit (HOSPITAL_COMMUNITY): Payer: Self-pay | Admitting: Cardiology

## 2015-11-20 ENCOUNTER — Encounter: Payer: Self-pay | Admitting: Cardiology

## 2015-11-20 ENCOUNTER — Ambulatory Visit (HOSPITAL_COMMUNITY)
Admission: RE | Admit: 2015-11-20 | Discharge: 2015-11-20 | Disposition: A | Discharge status: Home / Self Care | Payer: Medicare Other | Source: Ambulatory Visit | Attending: Cardiology | Admitting: Cardiology

## 2015-11-20 VITALS — BP 100/58 | HR 73 | Wt 194.0 lb

## 2015-11-20 DIAGNOSIS — I255 Ischemic cardiomyopathy: Secondary | ICD-10-CM | POA: Diagnosis not present

## 2015-11-20 DIAGNOSIS — I442 Atrioventricular block, complete: Secondary | ICD-10-CM | POA: Diagnosis not present

## 2015-11-20 DIAGNOSIS — I482 Chronic atrial fibrillation, unspecified: Secondary | ICD-10-CM

## 2015-11-20 DIAGNOSIS — Z79899 Other long term (current) drug therapy: Secondary | ICD-10-CM | POA: Insufficient documentation

## 2015-11-20 DIAGNOSIS — I2510 Atherosclerotic heart disease of native coronary artery without angina pectoris: Secondary | ICD-10-CM | POA: Diagnosis not present

## 2015-11-20 DIAGNOSIS — Z794 Long term (current) use of insulin: Secondary | ICD-10-CM | POA: Diagnosis not present

## 2015-11-20 DIAGNOSIS — Z955 Presence of coronary angioplasty implant and graft: Secondary | ICD-10-CM | POA: Insufficient documentation

## 2015-11-20 DIAGNOSIS — I5042 Chronic combined systolic (congestive) and diastolic (congestive) heart failure: Secondary | ICD-10-CM | POA: Diagnosis not present

## 2015-11-20 DIAGNOSIS — Z87891 Personal history of nicotine dependence: Secondary | ICD-10-CM | POA: Diagnosis not present

## 2015-11-20 DIAGNOSIS — Z8673 Personal history of transient ischemic attack (TIA), and cerebral infarction without residual deficits: Secondary | ICD-10-CM | POA: Diagnosis not present

## 2015-11-20 DIAGNOSIS — I5022 Chronic systolic (congestive) heart failure: Secondary | ICD-10-CM | POA: Diagnosis not present

## 2015-11-20 DIAGNOSIS — I130 Hypertensive heart and chronic kidney disease with heart failure and stage 1 through stage 4 chronic kidney disease, or unspecified chronic kidney disease: Secondary | ICD-10-CM | POA: Insufficient documentation

## 2015-11-20 DIAGNOSIS — R001 Bradycardia, unspecified: Secondary | ICD-10-CM | POA: Insufficient documentation

## 2015-11-20 DIAGNOSIS — Z7901 Long term (current) use of anticoagulants: Secondary | ICD-10-CM | POA: Insufficient documentation

## 2015-11-20 DIAGNOSIS — Z950 Presence of cardiac pacemaker: Secondary | ICD-10-CM | POA: Insufficient documentation

## 2015-11-20 DIAGNOSIS — I252 Old myocardial infarction: Secondary | ICD-10-CM | POA: Diagnosis not present

## 2015-11-20 DIAGNOSIS — N183 Chronic kidney disease, stage 3 unspecified: Secondary | ICD-10-CM

## 2015-11-20 NOTE — Telephone Encounter (Signed)
I received a note from the pharmacist that patient was taking Linzess prn.   Let's give him a call to see if it's too strong. Would be best to take it daily, but I do have some taking it off label "prn" but that may not be in his best interest.

## 2015-11-20 NOTE — Patient Instructions (Signed)
STOP Cardizem/Diltiazem.  Call CHF clinic in 1 week to follow up on heart rate.  Follow up with Dr. Aundra Dubin 1 month.  Do the following things EVERYDAY: 1) Weigh yourself in the morning before breakfast. Write it down and keep it in a log. 2) Take your medicines as prescribed 3) Eat low salt foods-Limit salt (sodium) to 2000 mg per day.  4) Stay as active as you can everyday 5) Limit all fluids for the day to less than 2 liters

## 2015-11-20 NOTE — Progress Notes (Signed)
Advanced Heart Failure Medication Review by a Pharmacist  Does the patient  feel that his/her medications are working for him/her?  yes  Has the patient been experiencing any side effects to the medications prescribed?  yes  Does the patient measure his/her own blood pressure or blood glucose at home?  yes   Does the patient have any problems obtaining medications due to transportation or finances?   no  Understanding of regimen: excellent Understanding of indications: excellent Potential of compliance: excellent Patient understands to avoid NSAIDs. Patient understands to avoid decongestants.  Issues to address at subsequent visits: none   Pharmacist comments:  [name] [name] is a pleasant 82 yo man here for HF clinic f/u. No issues with medications, side effects. Has good adherence and understanding of HF meds.   Of note, only takes Linzess prn, used only once this week. Does not have any GI/constipation issues anymore so he stopped taking Linzess qday. Advised [name] to inform prescriber of improvement in constipation issues and to discuss with provider if he can d/c medication or still needs to be on qday dosing.   Heloise Ochoa, [place].D., BCPS PGY2 Cardiology Pharmacy Resident Pager: [phone]    Time with patient: 10 min Preparation and documentation time: 5 min Total time: 15 min

## 2015-11-20 NOTE — Progress Notes (Signed)
Cancelled visit (seen in CHF clinic today). This encounter was created in error - please disregard.

## 2015-11-22 NOTE — Progress Notes (Signed)
Patient ID: [name] [name], male   DOB: 05-Nov-1928, 82 y.o.   MRN: [id number] PCP: Dr. Nadara Mustard Cardiology: Dr. Domenic Polite HF Cardiology: Dr. Aundra Dubin  82 yo with history of chronic atrial fibrillation, coronary artery disease, chronic systolic CHF/ischemic cardiomyopathy, GI bleeding, and CKD presents for CHF clinic evaluation.  Patient has failed DCCV x 2 in the past and remains in atrial fibrillation with controlled rate today.  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  The RCA was patent and a left main stent was patent.  He had had left main stent placed back in 9/14 when he presented with NSTEMI and 95% distal LM stenosis.  Last echo in 3/17 showed EF 35-40% with regional wall motion abnormalities.  Of note, he has a Optician, dispensing) placed because of symptomatic bradycardia.   Most recently, patient was admitted to [facility] in 2/17 with GI bleed.  He had 4 units PRBCs.  He has had colonoscopy and EGD without localization of bleeding source.  He is now off Plavix and only on warfarin.  No further BRBPR or melena.   Since last appointment torsemide was increased for a while then decreased again due to rise in creatinine.  His weight is down 2 lbs.  He says that he feels better.  No dyspnea walking on flat ground.  Main limitation now is due to low back pain.  No chest pain.  No orthopnea/PND.  Tolerated drop in diltiazem CD to 240 mg daily, HR in 60s-70s.     Labs (3/17): K 4.4 => 5, creatinine 1.4 => 2.10, HCT 32  PMH: 1. Atrial fibrillation: Chronic. He has failed DCCV x 2.  He is on warfarin.  2. H/o GI bleeding: He was admitted to [facility] in 2/17 and had 4 units PRBCs.  He had EGD and colonscopy without identification of bleeding site.   3. CKD stage III 4. HTN 5. CAD: MI in 1998.  Has anomalous left LCx off right cusp. - NSTEMI (9/14): 95% distal LM (supplied moderate D1), CTO LAD after D1, RCA patent,  small anomalous LCx off right cusp was occluded => DES to LM. - LHC (2/17) with no change to CTO pLAD after D1 or CTO LCx.  RCA was patent and LM stent was patent.  No changes.  6. Chronic systolic CHF: Ischemic cardiomyopathy.  - Echo (3/17) with EF 35%, anteroseptal and apical akinesis, PASP 65 mmHg.  - RHC (2/17) with mean RA 9, PA 52/15 mean 31, mean PCWP 20, CI 2.89, PVR 1.8 WU.  7. Symptomatic bradycardia: S/p St Jude leadless PPM.   8. 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  9. Low back pain  SH: Lives in [place] with wife.  Remote tobacco use.   Family History  Problem Relation Age of Onset  . Diabetes Other   . Hypertension Other   . Colon cancer Neg Hx    ROS: All systems reviewed and negative except as per HPI.   Current Outpatient Prescriptions  Medication Sig Dispense Refill  . Ascorbic Acid (VITAMIN C) 1000 MG tablet Take 1,000 mg by mouth daily.    Marland Kitchen atorvastatin (LIPITOR) 10 MG tablet TAKE ONE TABLET BY MOUTH EVERY DAY 30 tablet 6  . carvedilol (COREG) 25 MG tablet Take 1 tablet (25 mg total) by mouth 2 (two) times daily. 180 tablet 3  . IRON, FERROUS GLUCONATE, PO Take 27 mg of iron  by mouth 2 (two) times daily.    Marland Kitchen LANTUS 100 UNIT/ML injection Inject 30 Units into the skin at bedtime.     . Linaclotide (LINZESS) 145 MCG CAPS capsule Take 145 mcg by mouth daily as needed.    Marland Kitchen spironolactone (ALDACTONE) 25 MG tablet Take 0.5 tablets (12.5 mg total) by mouth daily. 15 tablet 3  . torsemide (DEMADEX) 20 MG tablet Take 2 tablets (40 mg total) by mouth 2 (two) times daily. 150 tablet 3  . warfarin (COUMADIN) 2 MG tablet Take 2-3 mg by mouth See admin instructions. PT HAS NOT BEEN TAKING FOR 2 WEEKS 10/22/15    . nitroGLYCERIN (NITROSTAT) 0.4 MG SL tablet Place 1 tablet (0.4 mg total) under the tongue every 5 (five) minutes x 3 doses as needed for chest pain. 25 tablet 3   No current facility-administered medications for this encounter.   BP 100/58 mmHg  Pulse 73  Wt 194 lb  (87.998 kg)  SpO2 98% General: NAD Neck: JVP 7 cm, no thyromegaly or thyroid nodule.  Lungs: Clear to auscultation bilaterally with normal respiratory effort. CV: Nondisplaced PMI.  Heart regular S1/S2, no S3/S4, no murmur.  1+ ankle edema.  No carotid bruit.  Normal pedal pulses.  Abdomen: Soft, nontender, no hepatosplenomegaly, no distention.  Skin: Intact without lesions or rashes.  Neurologic: Alert and oriented x 3.  Psych: Normal affect. Extremities: No clubbing or cyanosis.  HEENT: Normal.   Assessment/Plan: 1. CAD: Recent cath in 2/17 with patent LM stent, chronic occlusion of pLAD and small anomalous LCx.  No chest pain.  - Continue statin.  - Given recent GI bleeding, I stopped his Plavix given ongoing warfarin use.  LM stent was in 9/14 so has been several years ago.  He need long-term warfarin.   2. Atrial fibrillation: Chronic, failed DCCV x 2 in the past.  I suspect that he would be difficult to cardiovert and keep in NSR as it appears that he has been in atrial fibrillation since at least 2013. - He will continue warfarin. Would bridge with Lovenox if he has to come off warfarin in the future given history of CVA.  - HR controlled today.  Not ideal for him to be on diltiazem CD.  I will continue Coreg and have him stop diltiazem CD completely today.  He will check HR daily and will call us if HR starts to run > 90-100 bpm.   3. H/o GI bleeding: He had EGD and c-scope without identification of source.  He was given 4 units PRBCs at [facility] in 2/17. No further melena or BRBPR.   4. Chronic systolic CHF: Ischemic cardiomyopathy, EF 35% on 3/17 echo.  Elevated PA systolic pressure by echo likely reflective of elevated left atrial pressure.  NYHA class III symptoms.  Improved recently, no longer volume overloaded on exam. - Torsemide decreased to 40 mg bid after recent rise in creatinine.  BMET done at [facility] yesterday, I will work on getting a copy of these labs. - Continue Coreg  25 bid.  - Continue spironolactone 12.5 mg daily unless K elevated on labs from yesterday.  - Next step would consider addition of hydralazine/nitrates, hold off today with soft BP.  May have room after stopping diltiazem CD, however. Would hold off on ACEI for now with significant CKD.  5. Symptomatic bradycardia: He has a Pharmacist, hospital PPM.  Most recent echo showed v-pacing.  Will need to check with Dr Rayann Heman about this particular PPM =>  if it is pacing RV, frequent pacing is not ideal.  6. OSA: Strong suspicion for OSA.  Needs sleep study.   Followup in 1 month.   Loralie Champagne 11/22/2015

## 2015-11-23 NOTE — Telephone Encounter (Signed)
Noted. Thanks.

## 2015-11-23 NOTE — Telephone Encounter (Signed)
I called pt and he said he took it 4 days in a row to start with , 30 min before breakfast as recommended. He said he got too loose. He cut back and he takes it only as needed. It is helping him like that.  He said he has a good BM about once everyday to every other day.  He was going sometimes a week without a BM. He is pleased with the way this is doing right now. He has follow up appt with Laban Emperor, NP on 12/11/2015 and is aware and will keep that appt.

## 2015-11-24 DIAGNOSIS — M47812 Spondylosis without myelopathy or radiculopathy, cervical region: Secondary | ICD-10-CM | POA: Diagnosis not present

## 2015-11-24 DIAGNOSIS — M9901 Segmental and somatic dysfunction of cervical region: Secondary | ICD-10-CM | POA: Diagnosis not present

## 2015-11-24 DIAGNOSIS — M546 Pain in thoracic spine: Secondary | ICD-10-CM | POA: Diagnosis not present

## 2015-11-24 DIAGNOSIS — M9902 Segmental and somatic dysfunction of thoracic region: Secondary | ICD-10-CM | POA: Diagnosis not present

## 2015-11-24 DIAGNOSIS — M9903 Segmental and somatic dysfunction of lumbar region: Secondary | ICD-10-CM | POA: Diagnosis not present

## 2015-11-26 DIAGNOSIS — M546 Pain in thoracic spine: Secondary | ICD-10-CM | POA: Diagnosis not present

## 2015-11-26 DIAGNOSIS — M47812 Spondylosis without myelopathy or radiculopathy, cervical region: Secondary | ICD-10-CM | POA: Diagnosis not present

## 2015-11-26 DIAGNOSIS — M9901 Segmental and somatic dysfunction of cervical region: Secondary | ICD-10-CM | POA: Diagnosis not present

## 2015-11-26 DIAGNOSIS — M9902 Segmental and somatic dysfunction of thoracic region: Secondary | ICD-10-CM | POA: Diagnosis not present

## 2015-11-26 DIAGNOSIS — M9903 Segmental and somatic dysfunction of lumbar region: Secondary | ICD-10-CM | POA: Diagnosis not present

## 2015-11-30 DIAGNOSIS — M9903 Segmental and somatic dysfunction of lumbar region: Secondary | ICD-10-CM | POA: Diagnosis not present

## 2015-11-30 DIAGNOSIS — M9901 Segmental and somatic dysfunction of cervical region: Secondary | ICD-10-CM | POA: Diagnosis not present

## 2015-11-30 DIAGNOSIS — M546 Pain in thoracic spine: Secondary | ICD-10-CM | POA: Diagnosis not present

## 2015-11-30 DIAGNOSIS — M9902 Segmental and somatic dysfunction of thoracic region: Secondary | ICD-10-CM | POA: Diagnosis not present

## 2015-11-30 DIAGNOSIS — M47812 Spondylosis without myelopathy or radiculopathy, cervical region: Secondary | ICD-10-CM | POA: Diagnosis not present

## 2015-12-03 ENCOUNTER — Ambulatory Visit (INDEPENDENT_AMBULATORY_CARE_PROVIDER_SITE_OTHER): Payer: Medicare Other | Admitting: Internal Medicine

## 2015-12-03 ENCOUNTER — Ambulatory Visit: Payer: Medicare Other | Admitting: Cardiology

## 2015-12-03 ENCOUNTER — Encounter: Payer: Self-pay | Admitting: Internal Medicine

## 2015-12-03 VITALS — BP 132/70 | HR 81 | Ht 70.0 in | Wt 180.0 lb

## 2015-12-03 DIAGNOSIS — Z950 Presence of cardiac pacemaker: Secondary | ICD-10-CM

## 2015-12-03 DIAGNOSIS — I10 Essential (primary) hypertension: Secondary | ICD-10-CM | POA: Diagnosis not present

## 2015-12-03 DIAGNOSIS — I482 Chronic atrial fibrillation, unspecified: Secondary | ICD-10-CM

## 2015-12-03 DIAGNOSIS — I441 Atrioventricular block, second degree: Secondary | ICD-10-CM

## 2015-12-03 LAB — CUP PACEART INCLINIC DEVICE CHECK
Lead Channel Pacing Threshold Amplitude: 0.25 V
Lead Channel Pacing Threshold Pulse Width: 0.4 ms
Lead Channel Sensing Intrinsic Amplitude: 10.5 mV
MDC IDC SESS DTM: 20170420133235
Pulse Gen Serial Number: 4248

## 2015-12-03 NOTE — Progress Notes (Signed)
Electrophysiology Office Note   Date:  12/03/2015   ID:  [name] [name], DOB 1928/12/15, MRN [id number]  PCP:  Rory Percy, MD  Cardiologist:  Dr Domenic Polite Primary Electrophysiologist: Thompson Grayer, MD    Chief Complaint  Patient presents with  . Atrial Fibrillation     History of Present Illness: [name] [name] is a 82 y.o. male who presents today for electrophysiology evaluation.   He is doing well.  He remains active for his age.  He is now followed in the CHF clinic but seems to be doing well. Today, he denies symptoms of palpitations, chest pain, shortness of breath, orthopnea, PND, lower extremity edema, claudication, dizziness, presyncope, syncope, bleeding, or neurologic sequela. The patient is tolerating medications without difficulties and is otherwise without complaint today.    Past Medical History  Diagnosis Date  . Chronic atrial fibrillation ([place])     a. Failed prior DCCV x 2; b. chronic coumadin.  . Essential hypertension, benign   . Mixed hyperlipidemia   . Chronic systolic heart failure (HCC)     a. EF 40-45% by echo 2012.  . GI bleed     December 2011 - subsequent discontinuation of Pradaxa  . Right bundle branch block   . Coronary atherosclerosis of native coronary artery     a. s/p MI 1998;  b. Chronically occluded LAD, patent DES diagonal, subtotally occluded anomalous circumflex - failed prior PCI;  b. 04/2013 Cath/PCI: LM 95d (3.0x16 Promus Premier DES), LAD 00, D1 patent stent, LCX small 100, RCA min irregs, R->L Collats.  . Bradycardia     a. s/p SJM nanostim leadless pacemaker implanted by Dr Rayann Heman  . Ischemic cardiomyopathy     LVEF 45%  . Pacemaker 01/18/2013    STJ Nanostim Leadless pacemaker implanted by Dr Rayann Heman  . History of pneumonia 1970's  . Type II diabetes mellitus ([place])   . Anemia   . History of blood transfusion 09/2010; 12/2012  . CVA (cerebral vascular accident) ([place]) 09/2010    Suspected embolic while off anticoagulation.  .  Arthritis   . CKD (chronic kidney disease) stage 3, GFR 30-59 ml/min    Past Surgical History  Procedure Laterality Date  . Pacemaker insertion  01/18/2013    STJ Nanostim Leadless pacemaker implanted by Dr Rayann Heman  . Lumbar disc surgery  2000's  . Cystectomy  1951  . Permanent pacemaker insertion N/A 01/18/2013    Procedure: PERMANENT PACEMAKER INSERTION;  Surgeon: Thompson Grayer, MD;  Location: [facility] CATH LAB;  Service: Cardiovascular;  Laterality: N/A;  . Left heart catheterization with coronary angiogram N/A 05/03/2013    Procedure: LEFT HEART CATHETERIZATION WITH CORONARY ANGIOGRAM;  Surgeon: Peter M Martinique, MD;  Location: [facility] CATH LAB;  Service: Cardiovascular;  Laterality: N/A;  . Cardiac catheterization N/A 10/02/2015    Procedure: Right/Left Heart Cath and Coronary Angiography;  Surgeon: Sherren Mocha, MD;  Location: [place] CV LAB;  Service: Cardiovascular;  Laterality: N/A;  . Colonoscopy  Oct 12, 2015    Dr. Anthony Sar: sessile polyp at cecum (tubular adenoma), moderate diverticulosis in sigmoid and descending colon, moderate amount of old blood but no active bleeding  . Esophagogastroduodenoscopy  Oct 12, 2015    Dr. Anthony Sar: normal      Current Outpatient Prescriptions  Medication Sig Dispense Refill  . Ascorbic Acid (VITAMIN C) 1000 MG tablet Take 1,000 mg by mouth daily.    Marland Kitchen atorvastatin (LIPITOR) 10 MG tablet TAKE ONE TABLET BY MOUTH EVERY DAY 30  tablet 6  . carvedilol (COREG) 25 MG tablet Take 1 tablet (25 mg total) by mouth 2 (two) times daily. 180 tablet 3  . IRON, FERROUS GLUCONATE, PO Take 27 mg of iron by mouth 2 (two) times daily.    Marland Kitchen LANTUS 100 UNIT/ML injection Inject 30 Units into the skin at bedtime.     . Linaclotide (LINZESS) 145 MCG CAPS capsule Take 145 mcg by mouth daily as needed.    . nitroGLYCERIN (NITROSTAT) 0.4 MG SL tablet Place 1 tablet (0.4 mg total) under the tongue every 5 (five) minutes x 3 doses as needed for chest pain. 25 tablet 3  . spironolactone  (ALDACTONE) 25 MG tablet Take 0.5 tablets (12.5 mg total) by mouth daily. 15 tablet 3  . torsemide (DEMADEX) 20 MG tablet Take 2 tablets (40 mg total) by mouth 2 (two) times daily. 150 tablet 3  . warfarin (COUMADIN) 2 MG tablet Take 2-3 mg by mouth See admin instructions. PT HAS NOT BEEN TAKING FOR 2 WEEKS 10/22/15     No current facility-administered medications for this visit.    Allergies:   Colcrys; Contrast media; Penicillins; and Pradaxa   Social History:  The patient  reports that he quit smoking about 46 years ago. His smoking use included Cigarettes. He has a 12 pack-year smoking history. He has never used smokeless tobacco. He reports that he does not drink alcohol or use illicit drugs.   Family History:  The patient's family history includes Diabetes in his other; Hypertension in his other. There is no history of Colon cancer.    ROS:  Please see the history of present illness.   All other systems are reviewed and negative.    PHYSICAL EXAM: VS:  BP 132/70 mmHg  Pulse 81  Ht 5\' 10"  (1.778 m)  Wt 180 lb (81.647 kg)  BMI 25.83 kg/m2  SpO2 98% , BMI Body mass index is 25.83 kg/(m^2). GEN: Well nourished, well developed, in no acute distress HEENT: normal Neck: no JVD, carotid bruits, or masses Cardiac: iRRR  Respiratory:  clear to auscultation bilaterally, normal work of breathing GI: soft, nontender, nondistended, + BS MS: no deformity or atrophy Skin: warm and dry  Neuro:  Strength and sensation are intact Psych: euthymic mood, full affect Device interrogation is reviewed today in detail.  See PaceArt for details.   Recent Labs: 10/18/2015: ALT 15*; B Natriuretic Peptide 557.0*; BUN 29*; Creatinine, Ser 1.51*; Hemoglobin 9.8*; Platelets 119*; Potassium 4.1; Sodium 139    Lipid Panel     Component Value Date/Time   CHOL 72 07/24/2014 0225   TRIG 66 07/24/2014 0225   HDL 26* 07/24/2014 0225   CHOLHDL 2.8 07/24/2014 0225   VLDL 13 07/24/2014 0225   LDLCALC 33  07/24/2014 0225     Wt Readings from Last 3 Encounters:  12/03/15 180 lb (81.647 kg)  11/20/15 194 lb (87.998 kg)  11/04/15 196 lb 4 oz (89.018 kg)     ASSESSMENT AND PLAN:  1.  Permanent afib Rate control appears adequate chads2vasc score is at least 8.  Continue long term anticoagulation  2. Ischemic CM/ chronic combined systolic and diastolic chf euvolemic today Stable  3. CAD No ischemic symptoms No changes today  4. Mobitz II second degree AV block Normal pacemaker function See Pace Art report I have reduced pacing rate from 60 to 50 to reduce V pacing (Currently V pacing 60%) in the setting of CHF. Given advanced age and current functional status, I  would not advise device change to CRT-P.  I think that he has done very well with his single chamber pacemaker.   Current medicines are reviewed at length with the patient today.   The patient does not have concerns regarding his medicines.  The following changes were made today:  none   Follow-up:  Return to see EP NP every 6 months   Signed, Thompson Grayer, MD  12/03/2015 11:26 PM     [facility] [address] [place] [address] [phone] (office) [phone] (fax)

## 2015-12-03 NOTE — Patient Instructions (Signed)
Medication Instructions:  Your physician recommends that you continue on your current medications as directed. Please refer to the Current Medication list given to you today.   Labwork: .None ordered.   Testing/Procedures: None ordered.   Follow-Up: Your physician recommends that you schedule a follow-up appointment in:  6 months with Amber Seiler, NP  Any Other Special Instructions Will Be Listed Below (If Applicable).     If you need a refill on your cardiac medications before your next appointment, please call your pharmacy.   

## 2015-12-07 DIAGNOSIS — M9901 Segmental and somatic dysfunction of cervical region: Secondary | ICD-10-CM | POA: Diagnosis not present

## 2015-12-07 DIAGNOSIS — M546 Pain in thoracic spine: Secondary | ICD-10-CM | POA: Diagnosis not present

## 2015-12-07 DIAGNOSIS — M9902 Segmental and somatic dysfunction of thoracic region: Secondary | ICD-10-CM | POA: Diagnosis not present

## 2015-12-07 DIAGNOSIS — M9903 Segmental and somatic dysfunction of lumbar region: Secondary | ICD-10-CM | POA: Diagnosis not present

## 2015-12-07 DIAGNOSIS — M47812 Spondylosis without myelopathy or radiculopathy, cervical region: Secondary | ICD-10-CM | POA: Diagnosis not present

## 2015-12-11 ENCOUNTER — Ambulatory Visit: Payer: Medicare Other | Admitting: Gastroenterology

## 2015-12-14 DIAGNOSIS — M546 Pain in thoracic spine: Secondary | ICD-10-CM | POA: Diagnosis not present

## 2015-12-14 DIAGNOSIS — M9901 Segmental and somatic dysfunction of cervical region: Secondary | ICD-10-CM | POA: Diagnosis not present

## 2015-12-14 DIAGNOSIS — M9903 Segmental and somatic dysfunction of lumbar region: Secondary | ICD-10-CM | POA: Diagnosis not present

## 2015-12-14 DIAGNOSIS — M47812 Spondylosis without myelopathy or radiculopathy, cervical region: Secondary | ICD-10-CM | POA: Diagnosis not present

## 2015-12-14 DIAGNOSIS — M9902 Segmental and somatic dysfunction of thoracic region: Secondary | ICD-10-CM | POA: Diagnosis not present

## 2015-12-17 ENCOUNTER — Ambulatory Visit: Payer: Medicare Other | Attending: Cardiology | Admitting: Neurology

## 2015-12-17 DIAGNOSIS — R0683 Snoring: Secondary | ICD-10-CM | POA: Insufficient documentation

## 2015-12-17 DIAGNOSIS — G4733 Obstructive sleep apnea (adult) (pediatric): Secondary | ICD-10-CM | POA: Insufficient documentation

## 2015-12-17 DIAGNOSIS — G4731 Primary central sleep apnea: Secondary | ICD-10-CM | POA: Insufficient documentation

## 2015-12-22 ENCOUNTER — Encounter (HOSPITAL_COMMUNITY): Payer: Self-pay

## 2015-12-22 ENCOUNTER — Ambulatory Visit (HOSPITAL_COMMUNITY)
Admission: RE | Admit: 2015-12-22 | Discharge: 2015-12-22 | Disposition: A | Discharge status: Home / Self Care | Payer: Medicare Other | Source: Ambulatory Visit | Attending: Cardiology | Admitting: Cardiology

## 2015-12-22 VITALS — BP 124/68 | HR 58 | Wt 198.2 lb

## 2015-12-22 DIAGNOSIS — I130 Hypertensive heart and chronic kidney disease with heart failure and stage 1 through stage 4 chronic kidney disease, or unspecified chronic kidney disease: Secondary | ICD-10-CM | POA: Diagnosis not present

## 2015-12-22 DIAGNOSIS — I482 Chronic atrial fibrillation, unspecified: Secondary | ICD-10-CM

## 2015-12-22 DIAGNOSIS — I252 Old myocardial infarction: Secondary | ICD-10-CM | POA: Diagnosis not present

## 2015-12-22 DIAGNOSIS — Z79899 Other long term (current) drug therapy: Secondary | ICD-10-CM | POA: Diagnosis not present

## 2015-12-22 DIAGNOSIS — N183 Chronic kidney disease, stage 3 unspecified: Secondary | ICD-10-CM

## 2015-12-22 DIAGNOSIS — R001 Bradycardia, unspecified: Secondary | ICD-10-CM | POA: Insufficient documentation

## 2015-12-22 DIAGNOSIS — I2510 Atherosclerotic heart disease of native coronary artery without angina pectoris: Secondary | ICD-10-CM | POA: Insufficient documentation

## 2015-12-22 DIAGNOSIS — I441 Atrioventricular block, second degree: Secondary | ICD-10-CM

## 2015-12-22 DIAGNOSIS — Z833 Family history of diabetes mellitus: Secondary | ICD-10-CM | POA: Diagnosis not present

## 2015-12-22 DIAGNOSIS — Z7901 Long term (current) use of anticoagulants: Secondary | ICD-10-CM | POA: Insufficient documentation

## 2015-12-22 DIAGNOSIS — I5042 Chronic combined systolic (congestive) and diastolic (congestive) heart failure: Secondary | ICD-10-CM | POA: Diagnosis not present

## 2015-12-22 DIAGNOSIS — Z8249 Family history of ischemic heart disease and other diseases of the circulatory system: Secondary | ICD-10-CM | POA: Insufficient documentation

## 2015-12-22 DIAGNOSIS — Z794 Long term (current) use of insulin: Secondary | ICD-10-CM | POA: Diagnosis not present

## 2015-12-22 DIAGNOSIS — Z950 Presence of cardiac pacemaker: Secondary | ICD-10-CM | POA: Diagnosis not present

## 2015-12-22 DIAGNOSIS — Z955 Presence of coronary angioplasty implant and graft: Secondary | ICD-10-CM | POA: Diagnosis not present

## 2015-12-22 DIAGNOSIS — Z8673 Personal history of transient ischemic attack (TIA), and cerebral infarction without residual deficits: Secondary | ICD-10-CM | POA: Insufficient documentation

## 2015-12-22 DIAGNOSIS — I255 Ischemic cardiomyopathy: Secondary | ICD-10-CM | POA: Diagnosis not present

## 2015-12-22 DIAGNOSIS — Z87891 Personal history of nicotine dependence: Secondary | ICD-10-CM | POA: Diagnosis not present

## 2015-12-22 DIAGNOSIS — I5022 Chronic systolic (congestive) heart failure: Secondary | ICD-10-CM | POA: Diagnosis not present

## 2015-12-22 LAB — BASIC METABOLIC PANEL
Anion gap: 12 (ref 5–15)
BUN: 40 mg/dL — AB (ref 6–20)
CHLORIDE: 100 mmol/L — AB (ref 101–111)
CO2: 25 mmol/L (ref 22–32)
CREATININE: 1.63 mg/dL — AB (ref 0.61–1.24)
Calcium: 9.4 mg/dL (ref 8.9–10.3)
GFR calc non Af Amer: 37 mL/min — ABNORMAL LOW (ref >60)
GFR, EST AFRICAN AMERICAN: 43 mL/min — AB (ref >60)
Glucose, Bld: 157 mg/dL — ABNORMAL HIGH (ref 65–99)
POTASSIUM: 4.7 mmol/L (ref 3.5–5.1)
Sodium: 137 mmol/L (ref 135–145)

## 2015-12-22 LAB — CBC
HCT: 39.7 % (ref 39.0–52.0)
Hemoglobin: 12.7 g/dL — ABNORMAL LOW (ref 13.0–17.0)
MCH: 30 pg (ref 26.0–34.0)
MCHC: 32 g/dL (ref 30.0–36.0)
MCV: 93.6 fL (ref 78.0–100.0)
Platelets: 127 10*3/uL — ABNORMAL LOW (ref 150–400)
RBC: 4.24 MIL/uL (ref 4.22–5.81)
RDW: 17 % — ABNORMAL HIGH (ref 11.5–15.5)
WBC: 5.9 10*3/uL (ref 4.0–10.5)

## 2015-12-22 LAB — BRAIN NATRIURETIC PEPTIDE: B Natriuretic Peptide: 367.1 pg/mL — ABNORMAL HIGH (ref 0.0–100.0)

## 2015-12-22 MED ORDER — ISOSORBIDE MONONITRATE ER 30 MG PO TB24: 30.0000 mg | ORAL_TABLET | Freq: Every day | ORAL | Status: DC

## 2015-12-22 MED ORDER — TORSEMIDE 20 MG PO TABS: 40.0000 mg | ORAL_TABLET | Freq: Two times a day (BID) | ORAL | Status: DC

## 2015-12-22 MED ORDER — HYDRALAZINE HCL 10 MG PO TABS: 10.0000 mg | ORAL_TABLET | Freq: Three times a day (TID) | ORAL | Status: DC

## 2015-12-22 NOTE — Progress Notes (Signed)
Patient ID: [name] [name], male   DOB: May 11, 1929, 82 y.o.   MRN: [id number] PCP: Dr. Nadara Mustard Cardiology: Dr. Domenic Polite HF Cardiology: Dr. Aundra Dubin  82 yo with history of chronic atrial fibrillation, coronary artery disease, chronic systolic CHF/ischemic cardiomyopathy, GI bleeding, and CKD presents for CHF clinic evaluation.  Patient has failed DCCV x 2 in the past and remains in atrial fibrillation with controlled rate today.  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  The RCA was patent and a left main stent was patent.  He had had left main stent placed back in 9/14 when he presented with NSTEMI and 95% distal LM stenosis.  Last echo in 3/17 showed EF 35-40% with regional wall motion abnormalities.  Of note, he has a Optician, dispensing) placed because of symptomatic bradycardia.   Most recently, patient was admitted to [facility] in 2/17 with GI bleed.  He had 4 units PRBCs.  He has had colonoscopy and EGD without localization of bleeding source.  He is now off Plavix and only on warfarin.  No further BRBPR or melena.   I was concerned about frequent RV pacing.  He saw Dr Rayann Heman who decreased his lower rate to 50 to decrease RV pacing. He recommended against CRT-P.  Main problem remains low back pain.  However, he is not short of breath walking on flat ground now.  No orthopnea/PND.  No chest pain.  Some difficulty with balance but no lightheadedness or falls.  Weight is up about 4 lbs.      Labs (3/17): K 4.4 => 5, creatinine 1.4 => 2.10, HCT 32 Labs (4/17): K 5, creatinine 1.93  PMH: 1. Atrial fibrillation: Chronic. He has failed DCCV x 2.  He is on warfarin.  2. H/o GI bleeding: He was admitted to [facility] in 2/17 and had 4 units PRBCs.  He had EGD and colonscopy without identification of bleeding site.   3. CKD stage III 4. HTN 5. CAD: MI in 1998.  Has anomalous left LCx off right cusp. - NSTEMI (9/14):  95% distal LM (supplied moderate D1), CTO LAD after D1, RCA patent, small anomalous LCx off right cusp was occluded => DES to LM. - LHC (2/17) with no change to CTO pLAD after D1 or CTO LCx.  RCA was patent and LM stent was patent.  No changes.  6. Chronic systolic CHF: Ischemic cardiomyopathy.  - Echo (3/17) with EF 35%, anteroseptal and apical akinesis, PASP 65 mmHg.  - RHC (2/17) with mean RA 9, PA 52/15 mean 31, mean PCWP 20, CI 2.89, PVR 1.8 WU.  7. Symptomatic bradycardia: Mobitz type II 2nd degree AV block.  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   8. 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  9. Low back pain  SH: Lives in [place] with wife.  Remote tobacco use.   Family History  Problem Relation Age of Onset  . Diabetes Other   . Hypertension Other   . Colon cancer Neg Hx    ROS: All systems reviewed and negative except as per HPI.   Current Outpatient Prescriptions  Medication Sig Dispense Refill  . Ascorbic Acid (VITAMIN C) 1000 MG tablet Take 1,000 mg by mouth daily.    Marland Kitchen atorvastatin (LIPITOR) 10 MG tablet TAKE ONE TABLET BY MOUTH EVERY DAY 30 tablet 6  . carvedilol (COREG) 25 MG tablet Take 25 mg by mouth 2 (two) times  daily with a meal.    . IRON, FERROUS GLUCONATE, PO Take 27 mg of iron by mouth 2 (two) times daily.    Marland Kitchen LANTUS 100 UNIT/ML injection Inject 30 Units into the skin at bedtime.     Marland Kitchen spironolactone (ALDACTONE) 25 MG tablet Take 0.5 tablets (12.5 mg total) by mouth daily. 15 tablet 3  . torsemide (DEMADEX) 20 MG tablet Take 2 tablets (40 mg total) by mouth 2 (two) times daily. 120 tablet 3  . warfarin (COUMADIN) 2 MG tablet Take 2-3 mg by mouth See admin instructions. PT HAS NOT BEEN TAKING FOR 2 WEEKS 10/22/15    . hydrALAZINE (APRESOLINE) 10 MG tablet Take 1 tablet (10 mg total) by mouth 3 (three) times daily. 90 tablet 6  . isosorbide mononitrate (IMDUR) 30 MG 24 hr tablet Take 1 tablet (30 mg total) by mouth daily. 30 tablet 6  . Linaclotide (LINZESS) 145 MCG CAPS capsule Take  145 mcg by mouth daily as needed. Reported on 12/22/2015    . nitroGLYCERIN (NITROSTAT) 0.4 MG SL tablet Place 1 tablet (0.4 mg total) under the tongue every 5 (five) minutes x 3 doses as needed for chest pain. (Patient not taking: Reported on 12/22/2015) 25 tablet 3   No current facility-administered medications for this encounter.   BP 124/68 mmHg  Pulse 58  Wt 198 lb 4 oz (89.926 kg)  SpO2 100% General: NAD Neck: JVP 7 cm, no thyromegaly or thyroid nodule.  Lungs: Clear to auscultation bilaterally with normal respiratory effort. CV: Nondisplaced PMI.  Heart irregular S1/S2, no S3/S4, no murmur.  1+ ankle edema.  No carotid bruit.  Normal pedal pulses.  Abdomen: Soft, nontender, no hepatosplenomegaly, no distention.  Skin: Intact without lesions or rashes.  Neurologic: Alert and oriented x 3.  Psych: Normal affect. Extremities: No clubbing or cyanosis.  HEENT: Normal.   Assessment/Plan: 1. CAD: Recent cath in 2/17 with patent LM stent, chronic occlusion of pLAD and small anomalous LCx.  No chest pain.  - Continue statin.  - Given recent GI bleeding, I stopped his Plavix given ongoing warfarin use.  LM stent was in 9/14 so has been several years ago.  He needs long-term warfarin.   2. Atrial fibrillation: Chronic, failed DCCV x 2 in the past.  I suspect that he would be difficult to cardiovert and keep in NSR as it appears that he has been in atrial fibrillation since at least 2013. - He will continue warfarin. Would bridge with Lovenox if he has to come off warfarin in the future given history of CVA.  - He is now off diltiazem CD.  He remains on Coreg.  3. H/o GI bleeding: He had EGD and c-scope without identification of source.  He was given 4 units PRBCs at [facility] in 2/17. No further melena or BRBPR.  CBC today.  4. Chronic systolic CHF: Ischemic cardiomyopathy, EF 35% on 3/17 echo.  Elevated PA systolic pressure by echo likely reflective of elevated left atrial pressure.  NYHA class  II symptoms.  Improved recently, volume looks ok on exam.  - Continue torsemide 40 mg bid, BMET/BNP today. - Continue Coreg 25 bid.  - Continue spironolactone 12.5 mg daily.  - Would hold off on ACEI/ARB with CKD.  Will start hydralazine 10 mg tid + Imdur 30 daily.   5. Symptomatic bradycardia: He has a Pharmacist, hospital PPM.  Dr Rayann Heman recently decreased lower rate limit to try to limit RV pacing.  6. OSA: Strong  suspicion for OSA.  Had sleep study, awaiting results.  7. CKD: Stage 3.  BMET today.   Followup in 2 months.   Loralie Champagne 12/22/2015

## 2015-12-22 NOTE — Patient Instructions (Signed)
Start Hydralazine 10 mg Three times a day   Start Isosorbide 30 mg daily   Labs today  We will contact you in 2 months to schedule your next appointment.

## 2015-12-23 NOTE — Procedures (Signed)
[place] A. Merlene Laughter, MD     [email or web address]             NOCTURNAL POLYSOMNOGRAPHY   LOCATION: [facility]  Demographics Edit Patient Name: [name] [name], [name] [name] Date: 12/17/2015 Gender: Male D.O.B: 28-Sep-1928 Age (years): 63 Referring Provider: Not Available Height (inches): 69 Interpreting Physician: Phillips Odor MD, ABSM Weight (lbs): 188 RPSGT: [place], [place] BMI: 28 MRN: [id number] Neck Size: 17.00   CLINICAL INFORMATION EditMoveRemove this section The patient is referred for a split night study with BPAP. MEDICATIONS EditMoveRemove this section   Current outpatient prescriptions:  .  Ascorbic Acid (VITAMIN C) 1000 MG tablet, Take 1,000 mg by mouth daily., Disp: , Rfl:  .  atorvastatin (LIPITOR) 10 MG tablet, TAKE ONE TABLET BY MOUTH EVERY DAY, Disp: 30 tablet, Rfl: 6 .  carvedilol (COREG) 25 MG tablet, Take 25 mg by mouth 2 (two) times daily with a meal., Disp: , Rfl:  .  hydrALAZINE (APRESOLINE) 10 MG tablet, Take 1 tablet (10 mg total) by mouth 3 (three) times daily., Disp: 90 tablet, Rfl: 6 .  IRON, FERROUS GLUCONATE, PO, Take 27 mg of iron by mouth 2 (two) times daily., Disp: , Rfl:  .  isosorbide mononitrate (IMDUR) 30 MG 24 hr tablet, Take 1 tablet (30 mg total) by mouth daily., Disp: 30 tablet, Rfl: 6 .  LANTUS 100 UNIT/ML injection, Inject 30 Units into the skin at bedtime. , Disp: , Rfl:  .  Linaclotide (LINZESS) 145 MCG CAPS capsule, Take 145 mcg by mouth daily as needed. Reported on 12/22/2015, Disp: , Rfl:  .  nitroGLYCERIN (NITROSTAT) 0.4 MG SL tablet, Place 1 tablet (0.4 mg total) under the tongue every 5 (five) minutes x 3 doses as needed for chest pain. (Patient not taking: Reported on 12/22/2015), Disp: 25 tablet, Rfl: 3 .  spironolactone (ALDACTONE) 25 MG tablet, Take 0.5 tablets (12.5 mg total) by mouth daily., Disp: 15 tablet, Rfl: 3 .  torsemide (DEMADEX) 20 MG tablet, Take 2 tablets (40 mg total) by mouth 2 (two) times daily., Disp: 120  tablet, Rfl: 3 .  warfarin (COUMADIN) 2 MG tablet, Take 2-3 mg by mouth See admin instructions. PT HAS NOT BEEN TAKING FOR 2 WEEKS 10/22/15, Disp: , Rfl:   Medications taken by the patient : N/A  Medications administered by patient during sleep study : No sleep medicine administered. SLEEP STUDY TECHNIQUE EditMoveRemove this section As per the AASM Manual for the Scoring of Sleep and Associated Events v2.3 (April 2016) with a hypopnea requiring 4% desaturations. The channels recorded and monitored were frontal, central and occipital EEG, electrooculogram (EOG), submentalis EMG (chin), nasal and oral airflow, thoracic and abdominal wall motion, anterior tibialis EMG, snore microphone, electrocardiogram, and pulse oximetry. Bi-level positive airway pressure (BiPAP) was initiated when the patient met split night criteria and was titrated according to treat sleep-disordered breathing. RESPIRATORY PARAMETERS EditMoveRemove this section Diagnostic Total AHI (/hr): 61.9 RDI (/hr): 61.9 OA Index (/hr): - CA Index (/hr): 60.9 REM AHI (/hr): 0.0 NREM AHI (/hr): 62.9 Supine AHI (/hr): 67.1 Non-supine AHI (/hr): 58.44 Min O2 Sat (%): 78.00 Mean O2 (%): 92.31 Time below 88% (min): 34.6     Titration: CPAP 4 ONLY; BiPAP 8/4 - 10/6. Both modalities were unsuccessful due central apneas.  Optimal IPAP Pressure (cm):   Optimal EPAP Pressure (cm):   AHI at Optimal Pressure (/hr): N/A Min O2 at Optimal Pressure (%): 84.00 Sleep % at Optimal (%): N/A Supine % at Optimal (%): N/A  SLEEP ARCHITECTURE EditMoveRemove this section The study was initiated at 10:06:06 PM and terminated at 4:00:06 AM. The total recorded time was 354.0 minutes. EEG confirmed total sleep time was 244.3 minutes yielding a sleep efficiency of 69.0%. Sleep onset after lights out was 11.7 minutes with a REM latency of 121.0 minutes. The patient spent 7.16% of the night in stage N1 sleep, 72.98% in stage N2 sleep, 5.32% in stage N3 and 14.53% in  REM. Wake after sleep onset (WASO) was 98.0 minutes. The Arousal Index was 40.8/hour. LEG MOVEMENT DATA EditMoveRemove this section The total Periodic Limb Movements of Sleep (PLMS) were 0. The PLMS index was 0.00 . CARDIAC DATA EditMoveRemove this section The 2 lead EKG demonstrated atrial fibrillation. The mean heart rate was 55.11 beats per minute. Other EKG findings include: None.   IMPRESSIONS  Severe obstructive sleep apnea occurred during the diagnostic portion of the study (AHI = 61.9 /hour). Optimal BPAP/CPAP pressure could not be selected for this patient based on the available study data. Severe central sleep apnea occurred during the diagnostic portion of the study (CAI = 60.9/hour).  Full night formal CPAP/BiPAP titration is recommended. Lower CPAP with supplemental oxygen may be the best option.    Delano Metz, MD Diplomate, American Board of Sleep Medicine.

## 2015-12-30 DIAGNOSIS — M47812 Spondylosis without myelopathy or radiculopathy, cervical region: Secondary | ICD-10-CM | POA: Diagnosis not present

## 2015-12-30 DIAGNOSIS — M546 Pain in thoracic spine: Secondary | ICD-10-CM | POA: Diagnosis not present

## 2015-12-30 DIAGNOSIS — M9902 Segmental and somatic dysfunction of thoracic region: Secondary | ICD-10-CM | POA: Diagnosis not present

## 2015-12-30 DIAGNOSIS — M9903 Segmental and somatic dysfunction of lumbar region: Secondary | ICD-10-CM | POA: Diagnosis not present

## 2015-12-30 DIAGNOSIS — M9901 Segmental and somatic dysfunction of cervical region: Secondary | ICD-10-CM | POA: Diagnosis not present

## 2015-12-31 ENCOUNTER — Other Ambulatory Visit (HOSPITAL_COMMUNITY): Payer: Self-pay | Admitting: Cardiology

## 2015-12-31 DIAGNOSIS — G4733 Obstructive sleep apnea (adult) (pediatric): Secondary | ICD-10-CM

## 2016-01-05 ENCOUNTER — Ambulatory Visit (INDEPENDENT_AMBULATORY_CARE_PROVIDER_SITE_OTHER): Payer: Medicare Other | Admitting: Gastroenterology

## 2016-01-05 ENCOUNTER — Encounter: Payer: Self-pay | Admitting: Gastroenterology

## 2016-01-05 VITALS — BP 132/67 | HR 53 | Temp 97.0°F | Ht 70.0 in | Wt 196.0 lb

## 2016-01-05 DIAGNOSIS — D649 Anemia, unspecified: Secondary | ICD-10-CM | POA: Diagnosis not present

## 2016-01-05 DIAGNOSIS — K5900 Constipation, unspecified: Secondary | ICD-10-CM | POA: Diagnosis not present

## 2016-01-05 NOTE — Patient Instructions (Signed)
Please complete the blood work. We will call with the results.  We will see you in 6 months.   Let me know if you need any more medication for constipation (Linzess).

## 2016-01-05 NOTE — Progress Notes (Signed)
Referring Provider: Rory Percy, MD Primary Care Physician:  Rory Percy, MD  Primary GI: Dr. Oneida Alar   Chief Complaint  Patient presents with  . Follow-up    HPI:   [name] [name] is a 82 y.o. male presenting today with a history of GI bleed, hospitalized in [place] with colonoscopy 10/12/15 by Dr. Anthony Sar with sessile polyp at cecum (tubular adenoma), moderate diverticulosis in sigmoid and descending colon, moderate amount of old blood but no active bleeding, EGD normal. He has a significant cardiac history and is on Plavix and aspirin. It was felt the benefits of anticoagulation outweighed the risk at this point, due to his complicated cardiac history. He is not an ideal candidate for a capsule study due to presence of pacemaker. Further imaging has not been undertaken (such as CTE) due to renal disease, although this was discussed with Dr. Thornton Papas who stated reduced dosing of contrast could be used. However, he has had fluctuations in kidney function and states that the last imaging he had, he went into kidney failure. Therefore, imaging has been on hold. Hgb was 9.1 at discharge in Feb 2017, ferritin 30, iron 26. Due for repeat iron and ferritin now. Most recent Hgb 12.7, at patient's baseline 2 weeks ago.    Stool is black on iron. No bright red blood per rectum. Has a BM every day to every several days with Linzess, took samples and did well. Stopped taking them but now doing well without it. No straining or constipation. States get weak, back gives out if strenuous activity. No abdominal pain, eating well. Has plenty of gas.   Past Medical History  Diagnosis Date  . Chronic atrial fibrillation ([place])     a. Failed prior DCCV x 2; b. chronic coumadin.  . Essential hypertension, benign   . Mixed hyperlipidemia   . Chronic systolic heart failure (HCC)     a. EF 40-45% by echo 2012.  . GI bleed     December 2011 - subsequent discontinuation of Pradaxa  . Right bundle branch block    . Coronary atherosclerosis of native coronary artery     a. s/p MI 1998;  b. Chronically occluded LAD, patent DES diagonal, subtotally occluded anomalous circumflex - failed prior PCI;  b. 04/2013 Cath/PCI: LM 95d (3.0x16 Promus Premier DES), LAD 00, D1 patent stent, LCX small 100, RCA min irregs, R->L Collats.  . Bradycardia     a. s/p SJM nanostim leadless pacemaker implanted by Dr Rayann Heman  . Ischemic cardiomyopathy     LVEF 45%  . Pacemaker 01/18/2013    STJ Nanostim Leadless pacemaker implanted by Dr Rayann Heman  . History of pneumonia 1970's  . Type II diabetes mellitus ([place])   . Anemia   . History of blood transfusion 09/2010; 12/2012  . CVA (cerebral vascular accident) ([place]) 09/2010    Suspected embolic while off anticoagulation.  . Arthritis   . CKD (chronic kidney disease) stage 3, GFR 30-59 ml/min     Past Surgical History  Procedure Laterality Date  . Pacemaker insertion  01/18/2013    STJ Nanostim Leadless pacemaker implanted by Dr Rayann Heman  . Lumbar disc surgery  2000's  . Cystectomy  1951  . Permanent pacemaker insertion N/A 01/18/2013    Procedure: PERMANENT PACEMAKER INSERTION;  Surgeon: Thompson Grayer, MD;  Location: [facility] C[facility] LAB;  Service: Cardiovascular;  Laterality: N/A;  . Left heart catheterization with coronary angiogram N/A 05/03/2013    Procedure: LEFT HEART CATHETERIZATION WITH CORONARY ANGIOGRAM;  Surgeon: Peter M Martinique, MD;  Location: [facility] CATH LAB;  Service: Cardiovascular;  Laterality: N/A;  . Cardiac catheterization N/A 10/02/2015    Procedure: Right/Left Heart Cath and Coronary Angiography;  Surgeon: Sherren Mocha, MD;  Location: [place] CV LAB;  Service: Cardiovascular;  Laterality: N/A;  . Colonoscopy  Oct 12, 2015    Dr. Anthony Sar: sessile polyp at cecum (tubular adenoma), moderate diverticulosis in sigmoid and descending colon, moderate amount of old blood but no active bleeding  . Esophagogastroduodenoscopy  Oct 12, 2015    Dr. Anthony Sar: normal     Current  Outpatient Prescriptions  Medication Sig Dispense Refill  . Ascorbic Acid (VITAMIN C) 1000 MG tablet Take 1,000 mg by mouth daily.    Marland Kitchen atorvastatin (LIPITOR) 10 MG tablet TAKE ONE TABLET BY MOUTH EVERY DAY 30 tablet 6  . carvedilol (COREG) 25 MG tablet Take 25 mg by mouth 2 (two) times daily with a meal.    . hydrALAZINE (APRESOLINE) 10 MG tablet Take 1 tablet (10 mg total) by mouth 3 (three) times daily. 90 tablet 6  . IRON, FERROUS GLUCONATE, PO Take 27 mg of iron by mouth 2 (two) times daily.    . isosorbide mononitrate (IMDUR) 30 MG 24 hr tablet Take 1 tablet (30 mg total) by mouth daily. 30 tablet 6  . LANTUS 100 UNIT/ML injection Inject 30 Units into the skin at bedtime.     . nitroGLYCERIN (NITROSTAT) 0.4 MG SL tablet Place 1 tablet (0.4 mg total) under the tongue every 5 (five) minutes x 3 doses as needed for chest pain. 25 tablet 3  . spironolactone (ALDACTONE) 25 MG tablet Take 0.5 tablets (12.5 mg total) by mouth daily. 15 tablet 3  . torsemide (DEMADEX) 20 MG tablet Take 2 tablets (40 mg total) by mouth 2 (two) times daily. 120 tablet 3  . warfarin (COUMADIN) 2 MG tablet Take 2-3 mg by mouth See admin instructions. PT HAS NOT BEEN TAKING FOR 2 WEEKS 10/22/15     No current facility-administered medications for this visit.    Allergies as of 01/05/2016 - Review Complete 01/05/2016  Allergen Reaction Noted  . Colcrys [colchicine] Other (See Comments) 02/19/2013  . Contrast media [iodinated diagnostic agents] Other (See Comments) 12/03/2012  . Penicillins Swelling   . Pradaxa [dabigatran etexilate mesylate] Other (See Comments) 10/09/2014    Family History  Problem Relation Age of Onset  . Diabetes Other   . Hypertension Other   . Colon cancer Neg Hx     Social History   Social History  . Marital Status: Married    Spouse Name: [name]  . Number of Children: N/A  . Years of Education: N/A   Occupational History  . RETIRED   .     Social History Main Topics  .  Smoking status: Former Smoker -- 0.30 packs/day for 40 years    Types: Cigarettes    Quit date: 08/15/1969  . Smokeless tobacco: Never Used  . Alcohol Use: No  . Drug Use: No  . Sexual Activity: No   Other Topics Concern  . None   Social History Narrative    Review of Systems: As mentioned in HPI.   Physical Exam: BP 132/67 mmHg  Pulse 53  Temp(Src) 97 F (36.1 C)  Ht 5\' 10"  (1.778 m)  Wt 196 lb (88.905 kg)  BMI 28.12 kg/m2 General:   Alert and oriented. No distress noted. Pleasant and cooperative.  Head:  Normocephalic and atraumatic. Eyes:  Conjuctiva  clear without scleral icterus. Abdomen:  +BS, soft, non-tender and non-distended. Umbilical hernia noted Msk:  Symmetrical without gross deformities. Normal posture. Extremities:  Without edema. Neurologic:  Alert and  oriented x4;  grossly normal neurologically. Psych:  Alert and cooperative. Normal mood and affect.  Lab Results  Component Value Date   WBC 5.9 12/22/2015   HGB 12.7* 12/22/2015   HCT 39.7 12/22/2015   MCV 93.6 12/22/2015   PLT 127* 12/22/2015

## 2016-01-06 LAB — IRON AND TIBC
%SAT: 21 % (ref 15–60)
IRON: 73 ug/dL (ref 50–180)
TIBC: 352 ug/dL (ref 250–425)
UIBC: 279 ug/dL (ref 125–400)

## 2016-01-06 LAB — FERRITIN: Ferritin: 61 ng/mL (ref 20–380)

## 2016-01-12 NOTE — Assessment & Plan Note (Signed)
Feb 2017 admission at [facility] on anticoagulation, with recent Hgb around baseline. Thorough work-up to include colonoscopy/EGD. At this time, capsule study on hold and not an ideal candidate as he has a pacemaker. No further abdominal imaging needed. As anemia is improving, would hold off on any further invasive procedure and follow serially. Check iron, tibc, ferritin now. Return in 6 months.

## 2016-01-12 NOTE — Assessment & Plan Note (Signed)
Doing well without any agents. If he returns to constipation, would recommend Linzess 145 mcg daily, as he has done well with this historically.

## 2016-01-13 DIAGNOSIS — M9903 Segmental and somatic dysfunction of lumbar region: Secondary | ICD-10-CM | POA: Diagnosis not present

## 2016-01-13 DIAGNOSIS — M546 Pain in thoracic spine: Secondary | ICD-10-CM | POA: Diagnosis not present

## 2016-01-13 DIAGNOSIS — M9901 Segmental and somatic dysfunction of cervical region: Secondary | ICD-10-CM | POA: Diagnosis not present

## 2016-01-13 DIAGNOSIS — M9902 Segmental and somatic dysfunction of thoracic region: Secondary | ICD-10-CM | POA: Diagnosis not present

## 2016-01-13 DIAGNOSIS — M47812 Spondylosis without myelopathy or radiculopathy, cervical region: Secondary | ICD-10-CM | POA: Diagnosis not present

## 2016-01-13 NOTE — Progress Notes (Signed)
cc'ed to pcp °

## 2016-01-13 NOTE — Progress Notes (Signed)
Quick Note:  Called. Mailbox full, cannot leave a message. Mailing a letter with the results. ______

## 2016-01-13 NOTE — Progress Notes (Signed)
Quick Note:  IDA much improved. Iron normal, ferritin normal. Will see him again in Nov 2017. ______

## 2016-01-15 DIAGNOSIS — I482 Chronic atrial fibrillation: Secondary | ICD-10-CM | POA: Diagnosis not present

## 2016-01-18 ENCOUNTER — Telehealth (HOSPITAL_COMMUNITY): Payer: Self-pay | Admitting: Cardiology

## 2016-01-18 NOTE — Telephone Encounter (Signed)
Order placed appt scheduled for 7/7 Sleep center to mail packet with instructions, directions, and appt card  unable to reach patient by phone no answer x 3

## 2016-01-27 DIAGNOSIS — M546 Pain in thoracic spine: Secondary | ICD-10-CM | POA: Diagnosis not present

## 2016-01-27 DIAGNOSIS — M9901 Segmental and somatic dysfunction of cervical region: Secondary | ICD-10-CM | POA: Diagnosis not present

## 2016-01-27 DIAGNOSIS — M47812 Spondylosis without myelopathy or radiculopathy, cervical region: Secondary | ICD-10-CM | POA: Diagnosis not present

## 2016-01-27 DIAGNOSIS — M9903 Segmental and somatic dysfunction of lumbar region: Secondary | ICD-10-CM | POA: Diagnosis not present

## 2016-01-27 DIAGNOSIS — M9902 Segmental and somatic dysfunction of thoracic region: Secondary | ICD-10-CM | POA: Diagnosis not present

## 2016-02-01 ENCOUNTER — Encounter: Payer: Self-pay | Admitting: Cardiology

## 2016-02-01 ENCOUNTER — Ambulatory Visit (INDEPENDENT_AMBULATORY_CARE_PROVIDER_SITE_OTHER): Payer: Medicare Other | Admitting: Cardiology

## 2016-02-01 VITALS — BP 113/61 | HR 55 | Ht 69.0 in | Wt 204.6 lb

## 2016-02-01 DIAGNOSIS — I255 Ischemic cardiomyopathy: Secondary | ICD-10-CM | POA: Diagnosis not present

## 2016-02-01 DIAGNOSIS — I2510 Atherosclerotic heart disease of native coronary artery without angina pectoris: Secondary | ICD-10-CM

## 2016-02-01 DIAGNOSIS — I5042 Chronic combined systolic (congestive) and diastolic (congestive) heart failure: Secondary | ICD-10-CM

## 2016-02-01 DIAGNOSIS — N183 Chronic kidney disease, stage 3 unspecified: Secondary | ICD-10-CM

## 2016-02-01 DIAGNOSIS — I441 Atrioventricular block, second degree: Secondary | ICD-10-CM

## 2016-02-01 NOTE — Patient Instructions (Addendum)
Medication Instructions:   Your physician recommends that you continue on your current medications as directed. Please refer to the Current Medication list given to you today.  Labwork:  NONE  Testing/Procedures:  NONE  Follow-Up:  Your physician recommends that you schedule a follow-up appointment in: 4 months. You will receive a reminder letter in the mail in about 2 months reminding you to call and schedule your appointment. If you don't receive this letter, please contact our office.  Any Other Special Instructions Will Be Listed Below (If Applicable).  If you need a refill on your cardiac medications before your next appointment, please call your pharmacy. 

## 2016-02-01 NOTE — Progress Notes (Signed)
Cardiology Office Note  Date: 02/01/2016   ID: [name], DOB 13-Jun-1929, MRN [id number]  PCP: Rory Percy, MD  Primary Cardiologist: Rozann Lesches, MD   Chief Complaint  Patient presents with  . Cardiomyopathy  . Coronary Artery Disease  . Atrial Fibrillation    History of Present Illness: [name] [name] is a medically complex 82 y.o. male that I last saw and March. He continues to follow with Dr. Aundra Dubin in the CHF clinic. I reviewed his medication adjustments which he seems to be tolerating. He is here today with his wife for follow-up, actually doing reasonably well. I am encouraged by his progress. His weight is up by our scales although he reports no change in his dyspnea on exertion, no orthopnea. Complains of chronic fatigue. He has had no palpitations or chest pain.  I reviewed his medications which are outlined below. Current cardiac regimen includes Coreg, hydralazine, Lipitor, Imdur, Aldactone, Demadex, Coumadin, and as needed nitroglycerin.  Patient had a split-night sleep study per Dr. Merlene Laughter in May. It sounds like he required BiPAP treatment and there are plans for further titration in July. I do not have all of the information at hand.  Labwork from may showed creatinine of 1.6, potassium 4.7. He reports compliance with his medications.  Past Medical History  Diagnosis Date  . Chronic atrial fibrillation ([place])     a. Failed prior DCCV x 2; b. chronic coumadin.  . Essential hypertension, benign   . Mixed hyperlipidemia   . Chronic systolic heart failure (HCC)     a. EF 40-45% by echo 2012.  . GI bleed     December 2011 - subsequent discontinuation of Pradaxa  . Right bundle branch block   . Coronary atherosclerosis of native coronary artery     a. s/p MI 1998;  b. Chronically occluded LAD, patent DES diagonal, subtotally occluded anomalous circumflex - failed prior PCI;  b. 04/2013 Cath/PCI: LM 95d (3.0x16 Promus Premier DES), LAD 00, D1 patent stent,  LCX small 100, RCA min irregs, R->L Collats.  . Bradycardia     a. s/p SJM nanostim leadless pacemaker implanted by Dr Rayann Heman  . Ischemic cardiomyopathy     LVEF 45%  . Pacemaker 01/18/2013    STJ Nanostim Leadless pacemaker implanted by Dr Rayann Heman  . History of pneumonia 1970's  . Type II diabetes mellitus ([place])   . Anemia   . History of blood transfusion 09/2010; 12/2012  . CVA (cerebral vascular accident) ([place]) 09/2010    Suspected embolic while off anticoagulation.  . Arthritis   . CKD (chronic kidney disease) stage 3, GFR 30-59 ml/min     Past Surgical History  Procedure Laterality Date  . Pacemaker insertion  01/18/2013    STJ Nanostim Leadless pacemaker implanted by Dr Rayann Heman  . Lumbar disc surgery  2000's  . Cystectomy  1951  . Permanent pacemaker insertion N/A 01/18/2013    Procedure: PERMANENT PACEMAKER INSERTION;  Surgeon: Thompson Grayer, MD;  Location: [facility] CATH LAB;  Service: Cardiovascular;  Laterality: N/A;  . Left heart catheterization with coronary angiogram N/A 05/03/2013    Procedure: LEFT HEART CATHETERIZATION WITH CORONARY ANGIOGRAM;  Surgeon: Peter M Martinique, MD;  Location[facility] CATH LAB;  Service: Cardiovascular;  Laterality: N/A;  . Cardiac catheterization N/A 10/02/2015    Procedure: Right/Left Heart Cath and Coronary Angiography;  Surgeon: Sherren Mocha, MD;  Location: [place] CV LAB;  Service: Cardiovascular;  Laterality: N/A;  . Colonoscopy  Oct 12, 2015  Dr. Anthony Sar: sessile polyp at cecum (tubular adenoma), moderate diverticulosis in sigmoid and descending colon, moderate amount of old blood but no active bleeding  . Esophagogastroduodenoscopy  Oct 12, 2015    Dr. Anthony Sar: normal     Current Outpatient Prescriptions  Medication Sig Dispense Refill  . Ascorbic Acid (VITAMIN C) 1000 MG tablet Take 1,000 mg by mouth daily.    Marland Kitchen atorvastatin (LIPITOR) 10 MG tablet TAKE ONE TABLET BY MOUTH EVERY DAY 30 tablet 6  . carvedilol (COREG) 25 MG tablet Take 25 mg by mouth  2 (two) times daily with a meal.    . guaiFENesin (MUCINEX) 600 MG 12 hr tablet Take 600 mg by mouth at bedtime as needed.    . hydrALAZINE (APRESOLINE) 10 MG tablet Take 1 tablet (10 mg total) by mouth 3 (three) times daily. 90 tablet 6  . IRON, FERROUS GLUCONATE, PO Take 27 mg of iron by mouth 2 (two) times daily.    . isosorbide mononitrate (IMDUR) 30 MG 24 hr tablet Take 1 tablet (30 mg total) by mouth daily. 30 tablet 6  . LANTUS 100 UNIT/ML injection Inject 30 Units into the skin at bedtime.     . nitroGLYCERIN (NITROSTAT) 0.4 MG SL tablet Place 1 tablet (0.4 mg total) under the tongue every 5 (five) minutes x 3 doses as needed for chest pain. 25 tablet 3  . spironolactone (ALDACTONE) 25 MG tablet Take 0.5 tablets (12.5 mg total) by mouth daily. 15 tablet 3  . torsemide (DEMADEX) 20 MG tablet Take 2 tablets (40 mg total) by mouth 2 (two) times daily. 120 tablet 3  . warfarin (COUMADIN) 2 MG tablet Take 2-3 mg by mouth See admin instructions. MANAGED BY PMD     No current facility-administered medications for this visit.   Allergies:  Colcrys; Contrast media; Penicillins; and Pradaxa   Social History: The patient  reports that he quit smoking about 46 years ago. His smoking use included Cigarettes. He has a 12 pack-year smoking history. He has never used smokeless tobacco. He reports that he does not drink alcohol or use illicit drugs.   ROS:  Please see the history of present illness. Otherwise, complete review of systems is positive for decreased hearing, chronic leg pain, chronic dyspnea exertion.  All other systems are reviewed and negative.   Physical Exam: VS:  BP 113/61 mmHg  Pulse 55  Ht 5\' 9"  (1.753 m)  Wt 204 lb 9.6 oz (92.806 kg)  BMI 30.20 kg/m2  SpO2 99%, BMI Body mass index is 30.2 kg/(m^2).  Wt Readings from Last 3 Encounters:  02/01/16 204 lb 9.6 oz (92.806 kg)  01/05/16 196 lb (88.905 kg)  12/22/15 198 lb 4 oz (89.926 kg)    Overweight elderly male, no  distress. HEENT: Conjunctiva and lids normal, oropharynx clear.  Neck: Supple, no elevated JVP, no carotid bruits, no thyromegaly.  Lungs: Clear to auscultation, diminished. Cardiac Irregularly irregular, no S3, indistinct PMI.  Abdomen: Nontender, bowel sounds present, no guarding or rebound.  Extremities: 1+ leg edema, right worse than left, distal pulses 1-2+.   ECG: I personally reviewed the prior tracing from 10/18/2015 which showed a ventricular paced rhythm with underlying atrial fibrillation/flutter.  Recent Labwork: 10/18/2015: ALT 15*; AST 17 12/22/2015: B Natriuretic Peptide 367.1*; BUN 40*; Creatinine, Ser 1.63*; Hemoglobin 12.7*; Platelets 127*; Potassium 4.7; Sodium 137     Component Value Date/Time   CHOL 72 07/24/2014 0225   TRIG 66 07/24/2014 0225   HDL 26* 07/24/2014  0225   CHOLHDL 2.8 07/24/2014 0225   VLDL 13 07/24/2014 0225   LDLCALC 33 07/24/2014 0225   Other Studies Reviewed Today:  Echocardiogram 10/08/2015: Study Conclusions  - Left ventricle: The cavity size was at the upper limits of  normal. Wall thickness was increased in a pattern of mild LVH.  The estimated ejection fraction was 35%. There is akinesis of the  mid-apicalanteroseptal and apical myocardium. There is  hypokinesis of the apicalinferior myocardium. Features are  consistent with a pseudonormal left ventricular filling pattern,  with concomitant abnormal relaxation and increased filling  pressure (grade 2 diastolic dysfunction). - Aortic valve: Mildly to moderately calcified annulus. Trileaflet;  mildly thickened, mildly calcified leaflets. - Aortic root: The aortic root was mildly dilated. - Mitral valve: Calcified annulus. Mildly calcified leaflets .  There was mild to moderate regurgitation. - Left atrium: The atrium was mildly dilated. - Right atrium: Central venous pressure (est): 15 mm Hg. - Tricuspid valve: There was mild regurgitation. - Pulmonary arteries: PA peak  pressure: 65 mm Hg (S). - Pericardium, extracardiac: There was no pericardial effusion.  Impressions:  - Mild LVH with upper normal LV chamber size and LVEF approximately  35%. There is akinesis of the mid to apical anteroseptal wall and  apex with hypokinesis of the inferior apical wall consistent with  ischemic cardiomyopathy. Grade 2 diastolic dysfunction with  increased LV filling pressure. Mild left atrial enlargement.  Moderate MAC with mildly calcified mitral leaflets and mild to  moderate mitral regurgitation. Mildly dilated aortic root.  Sclerotic aortic valve without stenosis. Mild tricuspid  regurgitation. Normal RV contraction. Pulmonic hypertension with  PASP estimated 65 mmHg.  Assessment and Plan:  1. Multivessel CAD as outlined above, no active angina symptoms at present. No longer on Plavix with prior left main stenting in 2014 and need for ongoing Coumadin treatment.  2. Chronic atrial fibrillation with history of intermittent second-degree heart block and symptomatic bradycardia status post St. Jude leadless pacemaker. He remains on Coumadin, device followed by Dr. Rayann Heman. No rapid rates on current regimen.  3. Chronic systolic heart failure with ischemic cardiomyopathy and LVEF approximately 35%. Fortunately, he has tolerated the medication adjustments made in CHF clinic and is doing reasonably well. We discussed checking daily weights, adjusting diuretics, he reports compliance with Demadex at 40 mg twice daily, recent creatinine 1.6. Last addition was hydralazine and Imdur. We will see him back alternating with the CHF clinic.  4. OSA by recent split-night sleep study per Dr. Merlene Laughter. I do not have all the details but it looks like he required BiPAP and will be undergoing further titration study in July.  5. CKD stage 3, creatinine 1.6.  Current medicines were reviewed with the patient today.  Disposition: Follow-up in 4 months.  Signed, Satira Sark, MD, [facility] 02/01/2016 4:42 PM    [facility], [place], [place] [address] Phone: [phone]; Fax: [phone]

## 2016-02-02 ENCOUNTER — Telehealth: Payer: Self-pay | Admitting: *Deleted

## 2016-02-02 NOTE — Telephone Encounter (Signed)
Per Baxter Flattery, she will have the tech for sleep study department contact patient about his up coming appointment to give him and wife more details about what testing he is having.

## 2016-02-10 DIAGNOSIS — M9901 Segmental and somatic dysfunction of cervical region: Secondary | ICD-10-CM | POA: Diagnosis not present

## 2016-02-10 DIAGNOSIS — M9903 Segmental and somatic dysfunction of lumbar region: Secondary | ICD-10-CM | POA: Diagnosis not present

## 2016-02-10 DIAGNOSIS — M546 Pain in thoracic spine: Secondary | ICD-10-CM | POA: Diagnosis not present

## 2016-02-10 DIAGNOSIS — M47812 Spondylosis without myelopathy or radiculopathy, cervical region: Secondary | ICD-10-CM | POA: Diagnosis not present

## 2016-02-10 DIAGNOSIS — M9902 Segmental and somatic dysfunction of thoracic region: Secondary | ICD-10-CM | POA: Diagnosis not present

## 2016-02-12 DIAGNOSIS — Z794 Long term (current) use of insulin: Secondary | ICD-10-CM | POA: Diagnosis not present

## 2016-02-12 DIAGNOSIS — E113393 Type 2 diabetes mellitus with moderate nonproliferative diabetic retinopathy without macular edema, bilateral: Secondary | ICD-10-CM | POA: Diagnosis not present

## 2016-02-17 DIAGNOSIS — I63312 Cerebral infarction due to thrombosis of left middle cerebral artery: Secondary | ICD-10-CM | POA: Diagnosis not present

## 2016-02-29 ENCOUNTER — Other Ambulatory Visit (HOSPITAL_COMMUNITY): Payer: Self-pay | Admitting: Cardiology

## 2016-03-07 ENCOUNTER — Encounter (HOSPITAL_COMMUNITY): Payer: Self-pay | Admitting: Emergency Medicine

## 2016-03-07 ENCOUNTER — Emergency Department (HOSPITAL_COMMUNITY): Payer: Medicare Other

## 2016-03-07 ENCOUNTER — Telehealth: Payer: Self-pay | Admitting: Cardiology

## 2016-03-07 ENCOUNTER — Emergency Department (HOSPITAL_COMMUNITY)
Admission: EM | Admit: 2016-03-07 | Discharge: 2016-03-07 | Disposition: A | Discharge status: Home / Self Care | Payer: Medicare Other | Attending: Emergency Medicine | Admitting: Emergency Medicine

## 2016-03-07 DIAGNOSIS — Z8673 Personal history of transient ischemic attack (TIA), and cerebral infarction without residual deficits: Secondary | ICD-10-CM | POA: Insufficient documentation

## 2016-03-07 DIAGNOSIS — N183 Chronic kidney disease, stage 3 (moderate): Secondary | ICD-10-CM | POA: Insufficient documentation

## 2016-03-07 DIAGNOSIS — I482 Chronic atrial fibrillation: Secondary | ICD-10-CM | POA: Diagnosis not present

## 2016-03-07 DIAGNOSIS — I5022 Chronic systolic (congestive) heart failure: Secondary | ICD-10-CM | POA: Diagnosis not present

## 2016-03-07 DIAGNOSIS — Z87891 Personal history of nicotine dependence: Secondary | ICD-10-CM | POA: Insufficient documentation

## 2016-03-07 DIAGNOSIS — I130 Hypertensive heart and chronic kidney disease with heart failure and stage 1 through stage 4 chronic kidney disease, or unspecified chronic kidney disease: Secondary | ICD-10-CM | POA: Diagnosis not present

## 2016-03-07 DIAGNOSIS — Z794 Long term (current) use of insulin: Secondary | ICD-10-CM | POA: Insufficient documentation

## 2016-03-07 DIAGNOSIS — R42 Dizziness and giddiness: Secondary | ICD-10-CM | POA: Diagnosis not present

## 2016-03-07 DIAGNOSIS — Z79899 Other long term (current) drug therapy: Secondary | ICD-10-CM | POA: Insufficient documentation

## 2016-03-07 DIAGNOSIS — K921 Melena: Secondary | ICD-10-CM | POA: Insufficient documentation

## 2016-03-07 DIAGNOSIS — R0602 Shortness of breath: Secondary | ICD-10-CM | POA: Diagnosis not present

## 2016-03-07 DIAGNOSIS — E782 Mixed hyperlipidemia: Secondary | ICD-10-CM | POA: Diagnosis not present

## 2016-03-07 DIAGNOSIS — Z7901 Long term (current) use of anticoagulants: Secondary | ICD-10-CM | POA: Insufficient documentation

## 2016-03-07 DIAGNOSIS — R531 Weakness: Secondary | ICD-10-CM

## 2016-03-07 DIAGNOSIS — E1122 Type 2 diabetes mellitus with diabetic chronic kidney disease: Secondary | ICD-10-CM | POA: Insufficient documentation

## 2016-03-07 LAB — CBC
HEMATOCRIT: 40.9 % (ref 39.0–52.0)
Hemoglobin: 14.1 g/dL (ref 13.0–17.0)
MCH: 33.3 pg (ref 26.0–34.0)
MCHC: 34.5 g/dL (ref 30.0–36.0)
MCV: 96.7 fL (ref 78.0–100.0)
Platelets: 134 10*3/uL — ABNORMAL LOW (ref 150–400)
RBC: 4.23 MIL/uL (ref 4.22–5.81)
RDW: 13.8 % (ref 11.5–15.5)
WBC: 7.1 10*3/uL (ref 4.0–10.5)

## 2016-03-07 LAB — COMPREHENSIVE METABOLIC PANEL
ALBUMIN: 4 g/dL (ref 3.5–5.0)
ALT: 14 U/L — ABNORMAL LOW (ref 17–63)
AST: 18 U/L (ref 15–41)
Alkaline Phosphatase: 92 U/L (ref 38–126)
Anion gap: 8 (ref 5–15)
BUN: 50 mg/dL — AB (ref 6–20)
CHLORIDE: 101 mmol/L (ref 101–111)
CO2: 26 mmol/L (ref 22–32)
Calcium: 8.7 mg/dL — ABNORMAL LOW (ref 8.9–10.3)
Creatinine, Ser: 1.93 mg/dL — ABNORMAL HIGH (ref 0.61–1.24)
GFR calc Af Amer: 35 mL/min — ABNORMAL LOW (ref >60)
GFR calc non Af Amer: 30 mL/min — ABNORMAL LOW (ref >60)
GLUCOSE: 132 mg/dL — AB (ref 65–99)
POTASSIUM: 4.3 mmol/L (ref 3.5–5.1)
Sodium: 135 mmol/L (ref 135–145)
Total Bilirubin: 1.1 mg/dL (ref 0.3–1.2)
Total Protein: 7.3 g/dL (ref 6.5–8.1)

## 2016-03-07 LAB — I-STAT TROPONIN, ED: TROPONIN I, POC: 0.02 ng/mL (ref 0.00–0.08)

## 2016-03-07 LAB — TYPE AND SCREEN
ABO/RH(D): O POS
Antibody Screen: NEGATIVE

## 2016-03-07 LAB — PROTIME-INR
INR: 2.27 — AB (ref 0.00–1.49)
Prothrombin Time: 24.8 seconds — ABNORMAL HIGH (ref 11.6–15.2)

## 2016-03-07 NOTE — Discharge Instructions (Signed)
You were seen in the ED for generalized weakness. Follow with your PCP in the coming days for review of your home medications. Keep drinking plenty of water but pay attention not to exceed any daily fluid limits set by your Cardiologist.   Return with any sudden worsening weakness, chest pain, or difficulty breathing.

## 2016-03-07 NOTE — ED Triage Notes (Addendum)
Pt reports dizziness, changes in vision,general malaise,unsteady gait x1 month. Pt denies any pain. Pt alert and oriented. Airway patent. Facial symmetry noted. Speech clear.  Pt also reports is currently on iron pills. Pt reports "stool has become darker than usual with the iron pills x1 week."

## 2016-03-07 NOTE — Telephone Encounter (Signed)
Patient c/o increased dizziness, and black stools. Patient said he feels awful and felt like he did before when his iron was very low. No c/o n/v, fever, chest pain, or swelling. Patient and wife advised to contact his PCP for an appointment. Patient and wife verbalized understanding of plan.

## 2016-03-07 NOTE — ED Provider Notes (Signed)
[name] Provider Note   CSN: [id number] Arrival date & time: 03/07/16  1029  By signing my name below, I, [name], attest that this documentation has been prepared under the direction and in the presence of Margette Fast, MD. Electronically Signed: Judithann Sauger, ED Scribe. 03/07/16. 12:31 PM.   History   Chief Complaint Chief Complaint  Patient presents with  . Dizziness    HPI Comments: [name] [name] is a 82 y.o. male with a hx of A-fib, CKD, GI bleed with blood transfusion, and DM II who presents to the Emergency Department complaining of ongoing gradually worsening visual changes requiring him to use glasses to read onset 2 weeks ago. He reports associated general malaise, unsteady gait from a sitting position, and melena. No alleviating factors noted. Pt has not tried any medications PTA for these symptoms. He reports one episode of fall that occurred 2 weeks ago due to his unsteady gait. No LOC or head injuries at that time. He adds that he was lightheaded yesterday with SOB. Pt is currently on Coumadin and iron pills. He denies any fever, chills, abdominal pain, or n/v/d.    The history is provided by the patient. No language interpreter was used.    Past Medical History:  Diagnosis Date  . Anemia   . Arthritis   . Bradycardia    a. s/p SJM nanostim leadless pacemaker implanted by Dr Rayann Heman  . Chronic atrial fibrillation ([place])    a. Failed prior DCCV x 2; b. chronic coumadin.  . Chronic systolic heart failure ([place])    a. EF 40-45% by echo 2012.  . CKD (chronic kidney disease) stage 3, GFR 30-59 ml/min   . Coronary atherosclerosis of native coronary artery    a. s/p MI 1998;  b. Chronically occluded LAD, patent DES diagonal, subtotally occluded anomalous circumflex - failed prior PCI;  b. 04/2013 Cath/PCI: LM 95d (3.0x16 Promus Premier DES), LAD 00, D1 patent stent, LCX small 100, RCA min irregs, R->L Collats.  . CVA (cerebral vascular accident)  ([place]) 09/2010   Suspected embolic while off anticoagulation.  . Essential hypertension, benign   . GI bleed    December 2011 - subsequent discontinuation of Pradaxa  . History of blood transfusion 09/2010; 12/2012  . History of pneumonia 1970's  . Ischemic cardiomyopathy    LVEF 45%  . Mixed hyperlipidemia   . Pacemaker 01/18/2013   STJ Nanostim Leadless pacemaker implanted by Dr Rayann Heman  . Right bundle branch block   . Type II diabetes mellitus [facility])     Patient Active Problem List   Diagnosis Date Noted  . GI bleed 10/23/2015  . Anemia 10/23/2015  . Constipation 10/23/2015  . Acute on chronic combined systolic and diastolic heart failure ([place])   . Complete heart block ([place])   . Shortness of breath 07/23/2014  . Pacemaker 07/23/2014  . Hallucinations 07/23/2014  . Acute on chronic combined systolic and diastolic CHF (congestive heart failure) ([place]) 07/23/2014  . Warfarin anticoagulation 07/23/2014  . Second degree Mobitz II AV block 07/15/2013  . CKD (chronic kidney disease), stage III 05/02/2013  . Symptomatic bradycardia 12/20/2012  . Mitral regurgitation 12/03/2012  . [name] term (current) use of anticoagulants 11/06/2010  . HYPERLIPIDEMIA-MIXED 04/17/2009  . Essential hypertension, benign 04/17/2009  . CAD, NATIVE VESSEL 04/17/2009  . Chronic atrial fibrillation ([place]) 04/17/2009  . Chronic combined systolic and diastolic heart failure ([place]) 04/17/2009    Past Surgical History:  Procedure Laterality Date  . CARDIAC  CATHETERIZATION N/A 10/02/2015   Procedure: Right/Left Heart Cath and Coronary Angiography;  Surgeon: Sherren Mocha, MD;  Location: [place] CV LAB;  Service: Cardiovascular;  Laterality: N/A;  . COLONOSCOPY  Oct 12, 2015   Dr. Anthony Sar: sessile polyp at cecum (tubular adenoma), moderate diverticulosis in sigmoid and descending colon, moderate amount of old blood but no active bleeding  . CYSTECTOMY  1951  . ESOPHAGOGASTRODUODENOSCOPY  Oct 12, 2015   Dr.  Anthony Sar: normal   . LEFT HEART CATHETERIZATION WITH CORONARY ANGIOGRAM N/A 05/03/2013   Procedure: LEFT HEART CATHETERIZATION WITH CORONARY ANGIOGRAM;  Surgeon: Peter M Martinique, MD;  Location: [facility] CATH LAB;  Service: Cardiovascular;  Laterality: N/A;  . LUMBAR DISC SURGERY  2000's  . PACEMAKER INSERTION  01/18/2013   STJ Nanostim Leadless pacemaker implanted by Dr Rayann Heman  . PERMANENT PACEMAKER INSERTION N/A 01/18/2013   Procedure: PERMANENT PACEMAKER INSERTION;  Surgeon: Thompson Grayer, MD;  Location: [facility] CATH LAB;  Service: Cardiovascular;  Laterality: N/A;       Home Medications    Prior to Admission medications   Medication Sig Start Date End Date Taking? Authorizing Provider  atorvastatin (LIPITOR) 10 MG tablet TAKE ONE TABLET BY MOUTH EVERY DAY 11/04/13  Yes Satira Sark, MD  carvedilol (COREG) 25 MG tablet Take 25 mg by mouth 2 (two) times daily with a meal.   Yes Historical Provider, MD  hydrALAZINE (APRESOLINE) 10 MG tablet Take 1 tablet (10 mg total) by mouth 3 (three) times daily. 12/22/15  Yes Larey Dresser, MD  isosorbide mononitrate (IMDUR) 30 MG 24 hr tablet Take 1 tablet (30 mg total) by mouth daily. 12/22/15  Yes Larey Dresser, MD  spironolactone (ALDACTONE) 25 MG tablet TAKE 1/2 TABLET BY MOUTH DAILY 03/01/16  Yes Larey Dresser, MD  torsemide (DEMADEX) 20 MG tablet Take 2 tablets (40 mg total) by mouth 2 (two) times daily. 12/22/15  Yes Larey Dresser, MD  Ascorbic Acid (VITAMIN C) 1000 MG tablet Take 1,000 mg by mouth daily.    Historical Provider, MD  guaiFENesin (MUCINEX) 600 MG 12 hr tablet Take 600 mg by mouth at bedtime as needed.    Historical Provider, MD  IRON, FERROUS GLUCONATE, PO Take 27 mg of iron by mouth 2 (two) times daily.    Historical Provider, MD  LANTUS 100 UNIT/ML injection Inject 30 Units into the skin at bedtime.  10/06/10   Historical Provider, MD  nitroGLYCERIN (NITROSTAT) 0.4 MG SL tablet Place 1 tablet (0.4 mg total) under the tongue every 5 (five)  minutes x 3 doses as needed for chest pain. 08/20/14   Satira Sark, MD  warfarin (COUMADIN) 2 MG tablet Take 2-3 mg by mouth See admin instructions. MANAGED BY PMD    Historical Provider, MD    Family History Family History  Problem Relation Age of Onset  . Diabetes Other   . Hypertension Other   . Colon cancer Neg Hx     Social History Social History  Substance Use Topics  . Smoking status: Former Smoker    Packs/day: 0.30    Years: 40.00    Types: Cigarettes    Quit date: 08/15/1969  . Smokeless tobacco: Never Used  . Alcohol use No     Allergies   Colcrys [colchicine]; Contrast media [iodinated diagnostic agents]; Penicillins; and Pradaxa [dabigatran etexilate mesylate]   Review of Systems Review of Systems   Review of Systems Constitutional: No fever/chills Eyes: Visual changes. ENT: No sore throat. Cardiovascular:  Denies chest pain. Respiratory: Denies shortness of breath. Gastrointestinal: Melena. No abdominal pain.  No nausea, no vomiting.  No diarrhea.  No constipation. Genitourinary: Negative for dysuria. Musculoskeletal: Negative for back pain. Skin: Negative for rash. Neurological: Negative for headaches, focal weakness or numbness. 10-point ROS otherwise negative.  Physical Exam Updated Vital Signs BP (!) 116/49   Pulse (!) 55   Temp 97.5 F (36.4 C) (Oral)   Resp 21   Ht 5\' 10"  (1.778 m)   Wt 190 lb (86.2 kg)   SpO2 100%   BMI 27.26 kg/m   Physical Exam   Constitutional: Alert and oriented. Well appearing and in no acute distress. Eyes: Conjunctivae are normal. PERRL. EOMI. Head: Atraumatic. Nose: No congestion/rhinnorhea. Mouth/Throat: Mucous membranes are moist.  Oropharynx non-erythematous. Neck: No stridor.  No meningeal signs.   Cardiovascular: V-paced rhythm on monitor. Good peripheral circulation. Grossly normal heart sounds.   Respiratory: Normal respiratory effort.  No retractions. Lungs CTAB. Gastrointestinal: Soft and  nontender. No distention.  Musculoskeletal: No lower extremity tenderness nor edema. No gross deformities of extremities. Neurologic:  Normal speech and language. No gross focal neurologic deficits are appreciated.  Skin:  Skin is warm, dry and intact. No rash noted. Psychiatric: Mood and affect are normal. Speech and behavior are normal.  ED Treatments / Results  DIAGNOSTIC STUDIES: Oxygen Saturation is 100% on RA, normal by my interpretation.    COORDINATION OF CARE: 12:29 AM- Pt advised of plan for treatment and pt agrees. Pt will receive lab work, EKG, and rectal examination for further evaluation.    Labs (all labs ordered are listed, but only abnormal results are displayed) Labs Reviewed  COMPREHENSIVE METABOLIC PANEL - Abnormal; Notable for the following:       Result Value   Glucose, Bld 132 (*)    BUN 50 (*)    Creatinine, Ser 1.93 (*)    Calcium 8.7 (*)    ALT 14 (*)    GFR calc non Af Amer 30 (*)    GFR calc Af Amer 35 (*)    All other components within normal limits  CBC - Abnormal; Notable for the following:    Platelets 134 (*)    All other components within normal limits  PROTIME-INR - Abnormal; Notable for the following:    Prothrombin Time 24.8 (*)    INR 2.27 (*)    All other components within normal limits  I-STAT TROPOININ, ED  POC OCCULT BLOOD, ED  TYPE AND SCREEN    EKG  EKG Interpretation  Date/Time:  Monday March 07 2016 12:34:49 EDT Ventricular Rate:  60 PR Interval:    QRS Duration: 203 QT Interval:  525 QTC Calculation: 525 R Axis:   -41 Text Interpretation:  Ventricular-paced rhythm No further analysis attempted due to paced rhythm Baseline wander in lead(s) V3 No STEMI. Compared to prior tracings.  Confirmed by Raeden Belzer MD, [employer] [phone]) on 03/07/2016 1:07:03 PM       Radiology Dg Chest 2 View  Result Date: 03/07/2016 CLINICAL DATA:  Shortness of breath and dizziness. EXAM: CHEST  2 VIEW COMPARISON:  10/18/2015 FINDINGS: Implanted  cardiac monitor is stable in position. Cardiomediastinal silhouette is mildly enlarged. Mediastinal contours appear intact. The Thoracic aorta is torturous. There is no evidence of focal airspace consolidation, pleural effusion or pneumothorax. Mild pulmonary vascular congestion. Osseous structures are without acute abnormality. Soft tissues are grossly normal. IMPRESSION: Stable enlargement of the cardiac silhouette with mild pulmonary vascular congestion. Electronically Signed  By: Fidela Salisbury M.D.   On: 03/07/2016 13:27   Procedures Procedures (including critical care time)  None  Medications Ordered in ED  None   Initial Impression / Assessment and Plan / ED Course  Margette Fast, MD has reviewed the triage vital signs and the nursing notes.  Pertinent labs & imaging results that were available during my care of the patient were reviewed by me and considered in my medical decision making (see chart for details).  Clinical Course    82 yo male with past nuchal history of atrial fibrillation, congestive heart failure, on Coumadin presents to the emergency department for evaluation of generalized weakness, low energy, and lightheadedness on standing. He has a history of GI bleeding is required blood transfusions in the past. Reports being compliant with medications. He is very talkative and well-appearing on my exam. Plan for lab work including CBC, troponin with patient's generalized weakness and concern for possible anginal equivalent. We will obtain chest x-ray given his history of congestive heart failure.  01:53 PM Patient continues to feel well. H/H are in normal range and actually high compared to prior. No chest pain, dyspnea, or palpitations. Plan for Discharge and PCP follow up. Discussed keeping hydrated with patient and family.   Final Clinical Impressions(s) / ED Diagnoses   Final diagnoses:  Weakness generalized    New Prescriptions None  Nanda Quinton, MD     Margette Fast, MD 03/07/16 [phone]

## 2016-03-07 NOTE — Telephone Encounter (Signed)
Patient's wife called stating that he husband is not feeling right and has been experiencing chest pain.  She could not tell me for how long but said her husband normally doesn't complain but is now.   Wants to know if she needs to bring to ED or to our office

## 2016-03-09 DIAGNOSIS — M9903 Segmental and somatic dysfunction of lumbar region: Secondary | ICD-10-CM | POA: Diagnosis not present

## 2016-03-09 DIAGNOSIS — M47812 Spondylosis without myelopathy or radiculopathy, cervical region: Secondary | ICD-10-CM | POA: Diagnosis not present

## 2016-03-09 DIAGNOSIS — M9901 Segmental and somatic dysfunction of cervical region: Secondary | ICD-10-CM | POA: Diagnosis not present

## 2016-03-09 DIAGNOSIS — M546 Pain in thoracic spine: Secondary | ICD-10-CM | POA: Diagnosis not present

## 2016-03-09 DIAGNOSIS — M9902 Segmental and somatic dysfunction of thoracic region: Secondary | ICD-10-CM | POA: Diagnosis not present

## 2016-03-15 ENCOUNTER — Ambulatory Visit (HOSPITAL_COMMUNITY)
Admission: RE | Admit: 2016-03-15 | Discharge: 2016-03-15 | Disposition: A | Discharge status: Home / Self Care | Payer: Medicare Other | Source: Ambulatory Visit | Attending: Cardiology | Admitting: Cardiology

## 2016-03-15 ENCOUNTER — Encounter (HOSPITAL_COMMUNITY): Payer: Self-pay

## 2016-03-15 VITALS — BP 110/62 | HR 58 | Wt 196.0 lb

## 2016-03-15 DIAGNOSIS — I482 Chronic atrial fibrillation, unspecified: Secondary | ICD-10-CM

## 2016-03-15 DIAGNOSIS — Z87891 Personal history of nicotine dependence: Secondary | ICD-10-CM | POA: Diagnosis not present

## 2016-03-15 DIAGNOSIS — N183 Chronic kidney disease, stage 3 (moderate): Secondary | ICD-10-CM | POA: Diagnosis not present

## 2016-03-15 DIAGNOSIS — R001 Bradycardia, unspecified: Secondary | ICD-10-CM | POA: Insufficient documentation

## 2016-03-15 DIAGNOSIS — Z7901 Long term (current) use of anticoagulants: Secondary | ICD-10-CM | POA: Diagnosis not present

## 2016-03-15 DIAGNOSIS — G4733 Obstructive sleep apnea (adult) (pediatric): Secondary | ICD-10-CM | POA: Insufficient documentation

## 2016-03-15 DIAGNOSIS — I255 Ischemic cardiomyopathy: Secondary | ICD-10-CM | POA: Diagnosis not present

## 2016-03-15 DIAGNOSIS — I2510 Atherosclerotic heart disease of native coronary artery without angina pectoris: Secondary | ICD-10-CM | POA: Diagnosis not present

## 2016-03-15 DIAGNOSIS — Z794 Long term (current) use of insulin: Secondary | ICD-10-CM | POA: Insufficient documentation

## 2016-03-15 DIAGNOSIS — Z950 Presence of cardiac pacemaker: Secondary | ICD-10-CM | POA: Diagnosis not present

## 2016-03-15 DIAGNOSIS — M545 Low back pain: Secondary | ICD-10-CM | POA: Diagnosis not present

## 2016-03-15 DIAGNOSIS — I5022 Chronic systolic (congestive) heart failure: Secondary | ICD-10-CM | POA: Insufficient documentation

## 2016-03-15 DIAGNOSIS — I5042 Chronic combined systolic (congestive) and diastolic (congestive) heart failure: Secondary | ICD-10-CM | POA: Diagnosis not present

## 2016-03-15 DIAGNOSIS — Z8673 Personal history of transient ischemic attack (TIA), and cerebral infarction without residual deficits: Secondary | ICD-10-CM | POA: Diagnosis not present

## 2016-03-15 DIAGNOSIS — I252 Old myocardial infarction: Secondary | ICD-10-CM | POA: Diagnosis not present

## 2016-03-15 DIAGNOSIS — I130 Hypertensive heart and chronic kidney disease with heart failure and stage 1 through stage 4 chronic kidney disease, or unspecified chronic kidney disease: Secondary | ICD-10-CM | POA: Insufficient documentation

## 2016-03-15 LAB — BASIC METABOLIC PANEL
ANION GAP: 8 (ref 5–15)
BUN: 53 mg/dL — AB (ref 6–20)
CO2: 26 mmol/L (ref 22–32)
Calcium: 9.1 mg/dL (ref 8.9–10.3)
Chloride: 102 mmol/L (ref 101–111)
Creatinine, Ser: 1.83 mg/dL — ABNORMAL HIGH (ref 0.61–1.24)
GFR calc Af Amer: 37 mL/min — ABNORMAL LOW (ref >60)
GFR, EST NON AFRICAN AMERICAN: 32 mL/min — AB (ref >60)
Glucose, Bld: 148 mg/dL — ABNORMAL HIGH (ref 65–99)
POTASSIUM: 4.9 mmol/L (ref 3.5–5.1)
SODIUM: 136 mmol/L (ref 135–145)

## 2016-03-15 LAB — BRAIN NATRIURETIC PEPTIDE: B Natriuretic Peptide: 410.3 pg/mL — ABNORMAL HIGH (ref 0.0–100.0)

## 2016-03-15 MED ORDER — TORSEMIDE 20 MG PO TABS: ORAL_TABLET | ORAL | 3 refills | Status: DC

## 2016-03-15 NOTE — Patient Instructions (Signed)
Routine lab work today. Will notify you of abnormal results  Repeat Labs (bmet) in 2 weeks.  Hold Torsemide for Two doses.  *Do not take this evening or tomorrow morning.  Restart Torsemide tomorrow evening (03/16/2016) 20mg . *New dose Take Torsemide 40mg  in the morning and 20mg  in the evening.   Follow up with Dr.McLean in 2 months.

## 2016-03-16 DIAGNOSIS — M546 Pain in thoracic spine: Secondary | ICD-10-CM | POA: Diagnosis not present

## 2016-03-16 DIAGNOSIS — I482 Chronic atrial fibrillation: Secondary | ICD-10-CM | POA: Diagnosis not present

## 2016-03-16 DIAGNOSIS — M9902 Segmental and somatic dysfunction of thoracic region: Secondary | ICD-10-CM | POA: Diagnosis not present

## 2016-03-16 DIAGNOSIS — M9901 Segmental and somatic dysfunction of cervical region: Secondary | ICD-10-CM | POA: Diagnosis not present

## 2016-03-16 DIAGNOSIS — M47812 Spondylosis without myelopathy or radiculopathy, cervical region: Secondary | ICD-10-CM | POA: Diagnosis not present

## 2016-03-16 DIAGNOSIS — M9903 Segmental and somatic dysfunction of lumbar region: Secondary | ICD-10-CM | POA: Diagnosis not present

## 2016-03-16 NOTE — Progress Notes (Signed)
Patient ID: [name] [name], male   DOB: October 16, 1928, 82 y.o.   MRN: [id number] PCP: Dr. Nadara Mustard Cardiology: Dr. Domenic Polite HF Cardiology: Dr. Aundra Dubin  82 yo with history of chronic atrial fibrillation, coronary artery disease, chronic systolic CHF/ischemic cardiomyopathy, GI bleeding, and CKD presents for CHF clinic evaluation.  Patient has failed DCCV x 2 in the past and remains in atrial fibrillation with controlled rate today.  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  The RCA was patent and a left main stent was patent.  He had had left main stent placed back in 9/14 when he presented with NSTEMI and 95% distal LM stenosis.  Last echo in 3/17 showed EF 35-40% with regional wall motion abnormalities.  Of note, he has a Optician, dispensing) placed because of symptomatic bradycardia.   Most recently, patient was admitted to [facility] in 2/17 with GI bleed.  He had 4 units PRBCs.  He has had colonoscopy and EGD without localization of bleeding source.  He is now off Plavix and only on warfarin.  No further BRBPR or melena.   I was concerned about frequent RV pacing.  He saw Dr Rayann Heman who decreased his lower rate to 50 to decrease RV pacing. He recommended against CRT-P.  He went to the ER on 7/24 with generalized weakness and was thought to be dehydrated.  He had been taking spironolactone 25 daily rather than 12.5 mg daily as had been ordered.  He has occasional nausea after meals and staggers at times while walking.  No longer feeling weak, no lightheadedness.  No dyspnea walking on flat ground.  He does get short of breath carrying a load.  No chest pain. Weight is down 2 lbs.       Labs (3/17): K 4.4 => 5, creatinine 1.4 => 2.10, HCT 32 Labs (4/17): K 5, creatinine 1.93 Labs (7/17): K 4.3, creatinine 1.93, hgb 14.1  PMH: 1. Atrial fibrillation: Chronic. He has failed DCCV x 2.  He is on warfarin.  2. H/o GI  bleeding: He was admitted to [facility] in 2/17 and had 4 units PRBCs.  He had EGD and colonscopy without identification of bleeding site.   3. CKD stage III 4. HTN 5. CAD: MI in 1998.  Has anomalous left LCx off right cusp. - NSTEMI (9/14): 95% distal LM (supplied moderate D1), CTO LAD after D1, RCA patent, small anomalous LCx off right cusp was occluded => DES to LM. - LHC (2/17) with no change to CTO pLAD after D1 or CTO LCx.  RCA was patent and LM stent was patent.  No changes.  6. Chronic systolic CHF: Ischemic cardiomyopathy.  - Echo (3/17) with EF 35%, anteroseptal and apical akinesis, PASP 65 mmHg.  - RHC (2/17) with mean RA 9, PA 52/15 mean 31, mean PCWP 20, CI 2.89, PVR 1.8 WU.  7. Symptomatic bradycardia: Mobitz type II 2nd degree AV block.  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   8. 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  9. Low back pain  SH: Lives in [name] with wife.  Remote tobacco use.   Family History  Problem Relation Age of Onset  . Diabetes Other   . Hypertension Other   . Colon cancer Neg Hx    ROS: All systems reviewed and negative except as per HPI.   Current Outpatient Prescriptions  Medication Sig Dispense Refill  . Ascorbic Acid (  VITAMIN C) 1000 MG tablet Take 1,000 mg by mouth daily.    Marland Kitchen atorvastatin (LIPITOR) 10 MG tablet TAKE ONE TABLET BY MOUTH EVERY DAY 30 tablet 6  . carvedilol (COREG) 25 MG tablet Take 25 mg by mouth 2 (two) times daily with a meal.    . hydrALAZINE (APRESOLINE) 10 MG tablet Take 1 tablet (10 mg total) by mouth 3 (three) times daily. 90 tablet 6  . IRON, FERROUS GLUCONATE, PO Take 27 mg of iron by mouth 2 (two) times daily.    . isosorbide mononitrate (IMDUR) 30 MG 24 hr tablet Take 1 tablet (30 mg total) by mouth daily. 30 tablet 6  . LANTUS 100 UNIT/ML injection Inject 30 Units into the skin at bedtime.     Marland Kitchen spironolactone (ALDACTONE) 25 MG tablet TAKE 1/2 TABLET BY MOUTH DAILY 15 tablet 3  . torsemide (DEMADEX) 20 MG tablet Take 40mg  in the AM and  20mg  in the PM 120 tablet 3  . warfarin (COUMADIN) 2 MG tablet Take 2-3 mg by mouth See admin instructions. MANAGED BY PMD    . guaiFENesin (MUCINEX) 600 MG 12 hr tablet Take 600 mg by mouth at bedtime as needed.    . nitroGLYCERIN (NITROSTAT) 0.4 MG SL tablet Place 1 tablet (0.4 mg total) under the tongue every 5 (five) minutes x 3 doses as needed for chest pain. (Patient not taking: Reported on 03/15/2016) 25 tablet 3   No current facility-administered medications for this encounter.    BP 110/62   Pulse (!) 58   Wt 196 lb (88.9 kg)   SpO2 100%   BMI 28.12 kg/m  General: NAD Neck: JVP 7 cm, no thyromegaly or thyroid nodule.  Lungs: Clear to auscultation bilaterally with normal respiratory effort. CV: Nondisplaced PMI.  Heart irregular S1/S2, no S3/S4, no murmur.  No edema.  No carotid bruit.  Normal pedal pulses.  Abdomen: Soft, nontender, no hepatosplenomegaly, no distention.  Skin: Intact without lesions or rashes.  Neurologic: Alert and oriented x 3.  Psych: Normal affect. Extremities: No clubbing or cyanosis.  HEENT: Normal.   Assessment/Plan: 1. CAD: Recent cath in 2/17 with patent LM stent, chronic occlusion of pLAD and small anomalous LCx.  No chest pain.  - Continue statin.  - Given recent GI bleeding, I stopped his Plavix given ongoing warfarin use.  LM stent was in 9/14 so has been several years ago.  He needs long-term warfarin.   2. Atrial fibrillation: Chronic, failed DCCV x 2 in the past.  I suspect that he would be difficult to cardiovert and keep in NSR as it appears that he has been in atrial fibrillation since at least 2013. - He will continue warfarin. Would bridge with Lovenox if he has to come off warfarin in the future given history of CVA.  - He is now off diltiazem CD.  He remains on Coreg.  3. H/o GI bleeding: He had EGD and c-scope without identification of source.  He was given 4 units PRBCs at [facility] in 2/17. No further melena or BRBPR.  Hemoglobin stable  when checked in 7/17.  4. Chronic systolic CHF: Ischemic cardiomyopathy, EF 35% on 3/17 echo.  Elevated PA systolic pressure by echo likely reflective of elevated left atrial pressure.  NYHA class II symptoms.  Dyspnea improved recently, volume looks ok on exam.  May be a little on the dry side at this point.  - Hold torsemide x 2 doses, then decrease to 40 qam/20 qpm. BMET/BNP  today and repeat in 10 days.  - Continue Coreg 25 bid.  - Continue spironolactone 12.5 mg daily, will not increase given subjective "weakness." - Would hold off on ACEI/ARB with CKD.  Continue hydralazine 10 mg tid + Imdur 30 daily, will not increase given subjective "weakness."  5. Symptomatic bradycardia: He has a Pharmacist, hospital PPM.  Dr Allred decreased lower rate limit to try to limit RV pacing.  6. OSA: OSA on sleep study, needs CPAP titration.  I will try to arrange at [facility].   7. CKD: Stage 3.  BMET today.   Followup in 2 months.   Loralie Champagne 03/16/2016

## 2016-03-30 DIAGNOSIS — M546 Pain in thoracic spine: Secondary | ICD-10-CM | POA: Diagnosis not present

## 2016-03-30 DIAGNOSIS — M47812 Spondylosis without myelopathy or radiculopathy, cervical region: Secondary | ICD-10-CM | POA: Diagnosis not present

## 2016-03-30 DIAGNOSIS — M9903 Segmental and somatic dysfunction of lumbar region: Secondary | ICD-10-CM | POA: Diagnosis not present

## 2016-03-30 DIAGNOSIS — M9902 Segmental and somatic dysfunction of thoracic region: Secondary | ICD-10-CM | POA: Diagnosis not present

## 2016-03-30 DIAGNOSIS — M9901 Segmental and somatic dysfunction of cervical region: Secondary | ICD-10-CM | POA: Diagnosis not present

## 2016-04-01 ENCOUNTER — Other Ambulatory Visit (HOSPITAL_COMMUNITY): Payer: Medicare Other

## 2016-04-04 ENCOUNTER — Ambulatory Visit: Payer: Medicare Other | Attending: Cardiology | Admitting: Neurology

## 2016-04-04 DIAGNOSIS — G4733 Obstructive sleep apnea (adult) (pediatric): Secondary | ICD-10-CM | POA: Diagnosis not present

## 2016-04-07 NOTE — Procedures (Signed)
[place] A. Merlene Laughter, MD     [email or web address]             NOCTURNAL POLYSOMNOGRAPHY   LOCATION: [facility]   Patient Name: [name] [name], [name] [name] Date: 04/04/2016 Gender: Male D.O.B: Mar 26, 1929 Age (years): 22 Referring Provider: Not Available Height (inches): 69 Interpreting Physician: Phillips Odor MD, ABSM Weight (lbs): 204 RPSGT: Rosebud Poles BMI: 30 MRN: [id number] Neck Size: 17.00 CLINICAL INFORMATION The patient is referred for a split night study with BPAP. Most recent polysomnogram dated 12/17/2015 revealed an AHI of 61.9/h and RDI of 61.9/h. Most recent titration study dated 12/17/2015 revealed an AHI of 31.0/h. MEDICATIONS Medications taken by the patient : N/A  Medications administered by patient during sleep study : No sleep medicine administered.  Current Outpatient Prescriptions:  .  Ascorbic Acid (VITAMIN C) 1000 MG tablet, Take 1,000 mg by mouth daily., Disp: , Rfl:  .  atorvastatin (LIPITOR) 10 MG tablet, TAKE ONE TABLET BY MOUTH EVERY DAY, Disp: 30 tablet, Rfl: 6 .  carvedilol (COREG) 25 MG tablet, Take 25 mg by mouth 2 (two) times daily with a meal., Disp: , Rfl:  .  guaiFENesin (MUCINEX) 600 MG 12 hr tablet, Take 600 mg by mouth at bedtime as needed., Disp: , Rfl:  .  hydrALAZINE (APRESOLINE) 10 MG tablet, Take 1 tablet (10 mg total) by mouth 3 (three) times daily., Disp: 90 tablet, Rfl: 6 .  IRON, FERROUS GLUCONATE, PO, Take 27 mg of iron by mouth 2 (two) times daily., Disp: , Rfl:  .  isosorbide mononitrate (IMDUR) 30 MG 24 hr tablet, Take 1 tablet (30 mg total) by mouth daily., Disp: 30 tablet, Rfl: 6 .  LANTUS 100 UNIT/ML injection, Inject 30 Units into the skin at bedtime. , Disp: , Rfl:  .  nitroGLYCERIN (NITROSTAT) 0.4 MG SL tablet, Place 1 tablet (0.4 mg total) under the tongue every 5 (five) minutes x 3 doses as needed for chest pain. (Patient not taking: Reported on 03/15/2016), Disp: 25 tablet, Rfl: 3 .  spironolactone  (ALDACTONE) 25 MG tablet, TAKE 1/2 TABLET BY MOUTH DAILY, Disp: 15 tablet, Rfl: 3 .  torsemide (DEMADEX) 20 MG tablet, Take '40mg'$  in the AM and '20mg'$  in the PM, Disp: 120 tablet, Rfl: 3 .  warfarin (COUMADIN) 2 MG tablet, Take 2-3 mg by mouth See admin instructions. MANAGED BY PMD, Disp: , Rfl:   SLEEP STUDY TECHNIQUE As per the AASM Manual for the Scoring of Sleep and Associated Events v2.3 (April 2016) with a hypopnea requiring 4% desaturations. The channels recorded and monitored were frontal, central and occipital EEG, electrooculogram (EOG), submentalis EMG (chin), nasal and oral airflow, thoracic and abdominal wall motion, anterior tibialis EMG, snore microphone, electrocardiogram, and pulse oximetry. Bi-level positive airway pressure (BiPAP) was initiated when the patient met split night criteria and was titrated according to treat sleep-disordered breathing. RESPIRATORY PARAMETERS Diagnostic Total AHI (/hr): 36.2 RDI (/hr): 36.2 OA Index (/hr): 3.9 CA Index (/hr): 21.8 REM AHI (/hr): 1.4 NREM AHI (/hr): 42.7 Supine AHI (/hr): 36.9 Non-supine AHI (/hr): 34.88 Min O2 Sat (%): 85.00 Mean O2 (%): 97.59 Time below 88% (min): 0.2   Titration Optimal IPAP Pressure (cm):  Optimal EPAP Pressure (cm):  AHI at Optimal Pressure (/hr): N/A Min O2 at Optimal Pressure (%): N/A Sleep % at Optimal (%): N/A Supine % at Optimal (%): N/A     SLEEP ARCHITECTURE The study was initiated at 10:56:52 PM and terminated at 4:57:08 AM. The total recorded time was  360.3 minutes. EEG confirmed total sleep time was 278.5 minutes yielding a sleep efficiency of 77.3%. Sleep onset after lights out was 22.2 minutes with a REM latency of 47.0 minutes. The patient spent 2.15% of the night in stage N1 sleep, 76.30% in stage N2 sleep, 5.75% in stage N3 and 15.80% in REM. Wake after sleep onset (WASO) was 59.5 minutes. The Arousal Index was 18.1/hour. LEG MOVEMENT DATA The total Periodic Limb Movements of Sleep (PLMS) were 0. The  PLMS index was 0.00 . CARDIAC DATA The 2 lead EKG demonstrated sinus rhythm. The mean heart rate was N/A beats per minute. Other EKG findings include: PVCs.   IMPRESSIONS - Severe complex - central and obstructive sleep apnea. An optimal BPAP pressure could not be selected for this patient based on the available study data. Suggest AutoPAP 5- 15.    Delano Metz, MD Diplomate, American Board of Sleep Medicine.

## 2016-04-15 DIAGNOSIS — I63312 Cerebral infarction due to thrombosis of left middle cerebral artery: Secondary | ICD-10-CM | POA: Diagnosis not present

## 2016-04-20 DIAGNOSIS — I482 Chronic atrial fibrillation: Secondary | ICD-10-CM | POA: Diagnosis not present

## 2016-04-25 ENCOUNTER — Telehealth: Payer: Self-pay | Admitting: Cardiology

## 2016-04-25 NOTE — Telephone Encounter (Signed)
Nurse called needing to speak with someone in reference to Alert of Heart Failure

## 2016-04-25 NOTE — Telephone Encounter (Signed)
[facility] nurse Katharine Look states pt has increased wt 04/23/16 was 198.4 lbs, now 195.6 lbs.She also noted increased pedal edema.  Pt got weight down by dietary control alone,had gone to parties.Will take extra extra metolazone tonight.Was taking 40 mg am ,20 mg pm, will take extra 20 mg tonight.[facility] nurse will touch base with him tomorrow.  Also just to mention, he slipped mowing lawn with push mower and hit chest wall.Is on coumadin but no bruising.

## 2016-04-28 ENCOUNTER — Telehealth: Payer: Self-pay | Admitting: Cardiology

## 2016-04-28 DIAGNOSIS — G4730 Sleep apnea, unspecified: Secondary | ICD-10-CM

## 2016-04-28 NOTE — Telephone Encounter (Signed)
I spoke with wife,and relayed that we will call her next week after Dr Domenic Polite reviews

## 2016-04-28 NOTE — Telephone Encounter (Signed)
Sleep study results under procedures,done 04/20/16 Pt calling for results Sent to DOD as dr Domenic Polite in [place]

## 2016-04-28 NOTE — Telephone Encounter (Signed)
Sleep study was ordered and patient states that he has not been contacted with results. Please call patient. Please call patient on cell @ [phone] / tg

## 2016-04-28 NOTE — Telephone Encounter (Signed)
Sleep study did show some evidence of sleep apnea. I will defer to Dr Domenic Polite as far as arranging CPAP as I am unsure of his typical process of managing CPAPs for his patients. Will forward this message to Dr Domenic Polite, please let patient know that our office will be in touch.  Zandra Abts MD

## 2016-04-29 NOTE — Telephone Encounter (Signed)
Ref placed to Dr Lockie Mola for pt that I placed referral

## 2016-04-29 NOTE — Addendum Note (Signed)
Addended by: Barbarann Ehlers A on: 04/29/2016 01:09 PM   Modules accepted: Orders

## 2016-04-29 NOTE — Telephone Encounter (Signed)
Reviewed results. Please refer him to one of our sleep medicine providers to get treatment plan set up and followed. Thanks.

## 2016-05-06 ENCOUNTER — Encounter: Payer: Self-pay | Admitting: Nurse Practitioner

## 2016-05-16 DIAGNOSIS — E1165 Type 2 diabetes mellitus with hyperglycemia: Secondary | ICD-10-CM | POA: Diagnosis not present

## 2016-05-16 DIAGNOSIS — I482 Chronic atrial fibrillation: Secondary | ICD-10-CM | POA: Diagnosis not present

## 2016-05-16 DIAGNOSIS — N189 Chronic kidney disease, unspecified: Secondary | ICD-10-CM | POA: Diagnosis not present

## 2016-05-16 DIAGNOSIS — D509 Iron deficiency anemia, unspecified: Secondary | ICD-10-CM | POA: Diagnosis not present

## 2016-05-16 DIAGNOSIS — I10 Essential (primary) hypertension: Secondary | ICD-10-CM | POA: Diagnosis not present

## 2016-05-16 DIAGNOSIS — E7800 Pure hypercholesterolemia, unspecified: Secondary | ICD-10-CM | POA: Diagnosis not present

## 2016-05-18 DIAGNOSIS — I482 Chronic atrial fibrillation: Secondary | ICD-10-CM | POA: Diagnosis not present

## 2016-05-18 DIAGNOSIS — Z0000 Encounter for general adult medical examination without abnormal findings: Secondary | ICD-10-CM | POA: Diagnosis not present

## 2016-05-18 DIAGNOSIS — Z23 Encounter for immunization: Secondary | ICD-10-CM | POA: Diagnosis not present

## 2016-05-19 DIAGNOSIS — I482 Chronic atrial fibrillation: Secondary | ICD-10-CM | POA: Diagnosis not present

## 2016-05-19 DIAGNOSIS — I5022 Chronic systolic (congestive) heart failure: Secondary | ICD-10-CM | POA: Diagnosis not present

## 2016-05-19 DIAGNOSIS — G4733 Obstructive sleep apnea (adult) (pediatric): Secondary | ICD-10-CM | POA: Diagnosis not present

## 2016-05-24 NOTE — Progress Notes (Signed)
Electrophysiology Office Note Date: 05/25/2016  ID:  [name] [name], DOB 1929/05/04, MRN [id number]  PCP: Rory Percy, MD Primary Cardiologist: Domenic Polite Electrophysiologist: Allred  CC: Pacemaker follow-up  [name] [name] is a 82 y.o. male seen today for Dr Rayann Heman.  He presents today for routine electrophysiology followup.  Since last being seen in our clinic, the patient reports doing relatively well.  He has chronic shortness of breath with exertion that is unchanged from recent visits.  He has also recently been diagnosed with OSA and is pending picking up his CPAP machine.  He has occasional LE dependent edema that resolves with elevation of legs.  He denies chest pain, palpitations, PND, orthopnea, nausea, vomiting, dizziness, syncope, weight gain, or early satiety.  Device History: STJ leadless PPM implanted 2014 for Mobitz II heart block   Past Medical History:  Diagnosis Date  . Anemia   . Arthritis   . Bradycardia    a. s/p SJM nanostim leadless pacemaker implanted by Dr Rayann Heman  . Chronic atrial fibrillation ([place])    a. Failed prior DCCV x 2; b. chronic coumadin.  . Chronic systolic heart failure ([place])    a. EF 40-45% by echo 2012.  . CKD (chronic kidney disease) stage 3, GFR 30-59 ml/min   . Coronary atherosclerosis of native coronary artery    a. s/p MI 1998;  b. Chronically occluded LAD, patent DES diagonal, subtotally occluded anomalous circumflex - failed prior PCI;  b. 04/2013 Cath/PCI: LM 95d (3.0x16 Promus Premier DES), LAD 00, D1 patent stent, LCX small 100, RCA min irregs, R->L Collats.  . CVA (cerebral vascular accident) ([place]) 09/2010   Suspected embolic while off anticoagulation.  . Essential hypertension, benign   . GI bleed    December 2011 - subsequent discontinuation of Pradaxa  . History of blood transfusion 09/2010; 12/2012  . History of pneumonia 1970's  . Ischemic cardiomyopathy    LVEF 45%  . Mixed hyperlipidemia   . Pacemaker 01/18/2013   STJ  Nanostim Leadless pacemaker implanted by Dr Rayann Heman  . Right bundle branch block   . Type II diabetes mellitus ([place])    Past Surgical History:  Procedure Laterality Date  . CARDIAC CATHETERIZATION N/A 10/02/2015   Procedure: Right/Left Heart Cath and Coronary Angiography;  Surgeon: Sherren Mocha, MD;  Location: [facility];  Service: Cardiovascular;  Laterality: N/A;  . COLONOSCOPY  Oct 12, 2015   Dr. Anthony Sar: sessile polyp at cecum (tubular adenoma), moderate diverticulosis in sigmoid and descending colon, moderate amount of old blood but no active bleeding  . CYSTECTOMY  1951  . ESOPHAGOGASTRODUODENOSCOPY  Oct 12, 2015   Dr. Anthony Sar: normal   . LEFT HEART CATHETERIZATION WITH CORONARY ANGIOGRAM N/A 05/03/2013   Procedure: LEFT HEART CATHETERIZATION WITH CORONARY ANGIOGRAM;  Surgeon: Peter M Martinique, MD;  Location: [facility] CATH LAB;  Service: Cardiovascular;  Laterality: N/A;  . LUMBAR DISC SURGERY  2000's  . PACEMAKER INSERTION  01/18/2013   STJ Nanostim Leadless pacemaker implanted by Dr Rayann Heman  . PERMANENT PACEMAKER INSERTION N/A 01/18/2013   Procedure: PERMANENT PACEMAKER INSERTION;  Surgeon: Thompson Grayer, MD;  Location: [facility] C[facility] LAB;  Service: Cardiovascular;  Laterality: N/A;    Current Outpatient Prescriptions  Medication Sig Dispense Refill  . Ascorbic Acid (VITAMIN C) 1000 MG tablet Take 1,000 mg by mouth daily.    Marland Kitchen atorvastatin (LIPITOR) 10 MG tablet TAKE ONE TABLET BY MOUTH EVERY DAY 30 tablet 6  . carvedilol (COREG) 25 MG tablet Take  25 mg by mouth 2 (two) times daily with a meal.    . guaiFENesin (MUCINEX) 600 MG 12 hr tablet Take 600 mg by mouth at bedtime as needed.    . hydrALAZINE (APRESOLINE) 10 MG tablet Take 1 tablet (10 mg total) by mouth 3 (three) times daily. 90 tablet 6  . IRON, FERROUS GLUCONATE, PO Take 27 mg of iron by mouth 2 (two) times daily.    . isosorbide mononitrate (IMDUR) 30 MG 24 hr tablet Take 1 tablet (30 mg total) by mouth daily. 30 tablet 6  .  LANTUS 100 UNIT/ML injection Inject 30 Units into the skin at bedtime.     . nitroGLYCERIN (NITROSTAT) 0.4 MG SL tablet Place 1 tablet (0.4 mg total) under the tongue every 5 (five) minutes x 3 doses as needed for chest pain. 25 tablet 3  . spironolactone (ALDACTONE) 25 MG tablet TAKE 1/2 TABLET BY MOUTH DAILY 15 tablet 3  . torsemide (DEMADEX) 20 MG tablet Take 40mg  in the AM and 20mg  in the PM 120 tablet 3  . warfarin (COUMADIN) 2 MG tablet Take 2-3 mg by mouth See admin instructions. MANAGED BY PMD     No current facility-administered medications for this visit.     Allergies:   Colcrys [colchicine]; Contrast media [iodinated diagnostic agents]; Penicillins; and Pradaxa [dabigatran etexilate mesylate]   Social History: Social History   Social History  . Marital status: Married    Spouse name: [name]  . Number of children: N/A  . Years of education: N/A   Occupational History  . RETIRED   .  Retired   Social History Main Topics  . Smoking status: Former Smoker    Packs/day: 0.30    Years: 40.00    Types: Cigarettes    Quit date: 08/15/1969  . Smokeless tobacco: Never Used  . Alcohol use No  . Drug use: No  . Sexual activity: No   Other Topics Concern  . Not on file   Social History Narrative  . No narrative on file    Family History: Family History  Problem Relation Age of Onset  . Diabetes Other   . Hypertension Other   . Colon cancer Neg Hx      Review of Systems: All other systems reviewed and are otherwise negative except as noted above.   Physical Exam: VS:  BP (!) 144/60   Pulse 60   Ht 5' 9.5" (1.765 m)   Wt 200 lb 12.8 oz (91.1 kg)   BMI 29.23 kg/m  , BMI Body mass index is 29.23 kg/m.  GEN- The patient is elderly appearing, alert and oriented x 3 today.   HEENT: normocephalic, atraumatic; sclera clear, conjunctiva pink; hearing intact; oropharynx clear; neck supple Lungs- Clear to ausculation bilaterally, normal work of breathing.  No wheezes,  rales, rhonchi Heart- Regular rate and rhythm (paced) GI- soft, non-tender, non-distended, bowel sounds present Extremities- no clubbing, cyanosis, or edema MS- no significant deformity or atrophy Skin- warm and dry, no rash or lesion Psych- euthymic mood, full affect Neuro- strength and sensation are intact  PPM Interrogation- reviewed in detail today,  See PACEART report  EKG:  EKG is ordered today. The ekg ordered today shows atrial fibrillation, ventricular pacing, rate 58  Recent Labs: 03/07/2016: ALT 14; Hemoglobin 14.1; Platelets 134 03/15/2016: B Natriuretic Peptide 410.3; BUN 53; Creatinine, Ser 1.83; Potassium 4.9; Sodium 136   Wt Readings from Last 3 Encounters:  05/25/16 200 lb 12.8 oz (91.1 kg)  03/15/16 196 lb (88.9 kg)  03/07/16 190 lb (86.2 kg)     Other studies Reviewed: Additional studies/ records that were reviewed today include: Dr Jackalyn Lombard office notes  Assessment and Plan:  1.  Permanent atrial fibrillation V rates stable by device interrogation today Continue Warfarin for CHADS2VASC of at least 8  PCP following labs  2.   Mobitz II Normal PPM function No change today  3.  CAD No recent ischemic symptoms No Plavix with need for Warfarin and prior GI bleeding.   4.  Combined chronic systolic and diastolic heart failure Euvolemic on exam today NYHA Class II symptoms by history Previously evaluated by Dr Rayann Heman for device upgrade but not felt to be a candidate.  Continue medical therapy  5.  OSA Pending starting CPAP We discussed importance of compliance today     Current medicines are reviewed at length with the patient today.   The patient does not have concerns regarding his medicines.  The following changes were made today:  none  Labs/ tests ordered today include:  Orders Placed This Encounter  Procedures  . EKG 12-Lead     Disposition:   Follow up with Dr Rayann Heman in 6 months   Signed, Chanetta Marshall, NP 05/25/2016 11:34  AM  [facility] [address] [place] [address] [phone] (office) [phone] (fax)

## 2016-05-25 ENCOUNTER — Encounter: Payer: Self-pay | Admitting: Internal Medicine

## 2016-05-25 ENCOUNTER — Ambulatory Visit (INDEPENDENT_AMBULATORY_CARE_PROVIDER_SITE_OTHER): Payer: Medicare Other | Admitting: Nurse Practitioner

## 2016-05-25 ENCOUNTER — Encounter: Payer: Self-pay | Admitting: Nurse Practitioner

## 2016-05-25 VITALS — BP 144/60 | HR 60 | Ht 69.5 in | Wt 200.8 lb

## 2016-05-25 DIAGNOSIS — G4733 Obstructive sleep apnea (adult) (pediatric): Secondary | ICD-10-CM

## 2016-05-25 DIAGNOSIS — I4821 Permanent atrial fibrillation: Secondary | ICD-10-CM

## 2016-05-25 DIAGNOSIS — I442 Atrioventricular block, complete: Secondary | ICD-10-CM

## 2016-05-25 DIAGNOSIS — I441 Atrioventricular block, second degree: Secondary | ICD-10-CM | POA: Diagnosis not present

## 2016-05-25 DIAGNOSIS — I5042 Chronic combined systolic (congestive) and diastolic (congestive) heart failure: Secondary | ICD-10-CM

## 2016-05-25 DIAGNOSIS — I482 Chronic atrial fibrillation: Secondary | ICD-10-CM

## 2016-05-25 LAB — CUP PACEART INCLINIC DEVICE CHECK
Date Time Interrogation Session: 20171011114120
Pulse Gen Serial Number: 4248

## 2016-05-25 NOTE — Patient Instructions (Signed)
Medication Instructions:  Your physician recommends that you continue on your current medications as directed. Please refer to the Current Medication list given to you today.    If you need a refill on your cardiac medications before your next appointment, please call your pharmacy.  Labwork:  NONE ORDER TODAY    Testing/Procedures:  NONE ORDER TODAY    Follow-Up:  Your physician wants you to follow-up in:  IN  6  MONTHS WITH DR ALLRED You will receive a reminder letter in the mail two months in advance. If you don't receive a letter, please call our office to schedule the follow-up appointment.      Any Other Special Instructions Will Be Listed Below (If Applicable).                                                                                                                                                   

## 2016-05-30 DIAGNOSIS — G4733 Obstructive sleep apnea (adult) (pediatric): Secondary | ICD-10-CM | POA: Diagnosis not present

## 2016-05-31 NOTE — Progress Notes (Deleted)
Cardiology Office Note  Date: 05/31/2016   ID: [name] [name], DOB 05/06/1929, MRN [id number]  PCP: Rory Percy, MD  Primary Cardiologist: Rozann Lesches, MD   No chief complaint on file.   History of Present Illness: [name] [name] is a medically complex 82 y.o. male last seen in June. I reviewed his subsequent Heart Failure Clinic note from August.  Recent office visit with Dr. Rayann Heman noted. Patient has a Scientist, clinical (histocompatibility and immunogenetics) pacemaker in place. He has had stable ventricular rates in chronic atrial fibrillation. He continues on Coumadin with follow-up per Dr. Nadara Mustard.  Past Medical History:  Diagnosis Date  . Anemia   . Arthritis   . Bradycardia    a. s/p SJM nanostim leadless pacemaker implanted by Dr Rayann Heman  . Chronic atrial fibrillation ([place])    a. Failed prior DCCV x 2; b. chronic coumadin.  . Chronic systolic heart failure ([place])    a. EF 40-45% by echo 2012.  . CKD (chronic kidney disease) stage 3, GFR 30-59 ml/min   . Coronary atherosclerosis of native coronary artery    a. s/p MI 1998;  b. Chronically occluded LAD, patent DES diagonal, subtotally occluded anomalous circumflex - failed prior PCI;  b. 04/2013 Cath/PCI: LM 95d (3.0x16 Promus Premier DES), LAD 00, D1 patent stent, LCX small 100, RCA min irregs, R->L Collats.  . CVA (cerebral vascular accident) ([place]) 09/2010   Suspected embolic while off anticoagulation.  . Essential hypertension, benign   . GI bleed    December 2011 - subsequent discontinuation of Pradaxa  . History of blood transfusion 09/2010; 12/2012  . History of pneumonia 1970's  . Ischemic cardiomyopathy    LVEF 45%  . Mixed hyperlipidemia   . Pacemaker 01/18/2013   STJ Nanostim Leadless pacemaker implanted by Dr Rayann Heman  . Right bundle branch block   . Type II diabetes mellitus ([place])     Past Surgical History:  Procedure Laterality Date  . CARDIAC CATHETERIZATION N/A 10/02/2015   Procedure: Right/Left Heart Cath and Coronary Angiography;   Surgeon: Sherren Mocha, MD;  Location: [facility];  Service: Cardiovascular;  Laterality: N/A;  . COLONOSCOPY  Oct 12, 2015   Dr. Anthony Sar: sessile polyp at cecum (tubular adenoma), moderate diverticulosis in sigmoid and descending colon, moderate amount of old blood but no active bleeding  . CYSTECTOMY  1951  . ESOPHAGOGASTRODUODENOSCOPY  Oct 12, 2015   Dr. Anthony Sar: normal   . LEFT HEART CATHETERIZATION WITH CORONARY ANGIOGRAM N/A 05/03/2013   Procedure: LEFT HEART CATHETERIZATION WITH CORONARY ANGIOGRAM;  Surgeon: Peter M Martinique, MD;  Location: [facility] CATH LAB;  Service: Cardiovascular;  Laterality: N/A;  . LUMBAR DISC SURGERY  2000's  . PACEMAKER INSERTION  01/18/2013   STJ Nanostim Leadless pacemaker implanted by Dr Rayann Heman  . PERMANENT PACEMAKER INSERTION N/A 01/18/2013   Procedure: PERMANENT PACEMAKER INSERTION;  Surgeon: Thompson Grayer, MD;  [facility] C[facility];  Service: Cardiovascular;  Laterality: N/A;    Current Outpatient Prescriptions  Medication Sig Dispense Refill  . Ascorbic Acid (VITAMIN C) 1000 MG tablet Take 1,000 mg by mouth daily.    Marland Kitchen atorvastatin (LIPITOR) 10 MG tablet TAKE ONE TABLET BY MOUTH EVERY DAY 30 tablet 6  . carvedilol (COREG) 25 MG tablet Take 25 mg by mouth 2 (two) times daily with a meal.    . guaiFENesin (MUCINEX) 600 MG 12 hr tablet Take 600 mg by mouth at bedtime as needed.    . hydrALAZINE (APRESOLINE) 10 MG  tablet Take 1 tablet (10 mg total) by mouth 3 (three) times daily. 90 tablet 6  . IRON, FERROUS GLUCONATE, PO Take 27 mg of iron by mouth 2 (two) times daily.    . isosorbide mononitrate (IMDUR) 30 MG 24 hr tablet Take 1 tablet (30 mg total) by mouth daily. 30 tablet 6  . LANTUS 100 UNIT/ML injection Inject 30 Units into the skin at bedtime.     . nitroGLYCERIN (NITROSTAT) 0.4 MG SL tablet Place 1 tablet (0.4 mg total) under the tongue every 5 (five) minutes x 3 doses as needed for chest pain. 25 tablet 3  . spironolactone (ALDACTONE) 25 MG tablet  TAKE 1/2 TABLET BY MOUTH DAILY 15 tablet 3  . torsemide (DEMADEX) 20 MG tablet Take 40mg  in the AM and 20mg  in the PM 120 tablet 3  . warfarin (COUMADIN) 2 MG tablet Take 2-3 mg by mouth See admin instructions. MANAGED BY PMD     No current facility-administered medications for this visit.    Allergies:  Colcrys [colchicine]; Contrast media [iodinated diagnostic agents]; Penicillins; and Pradaxa [dabigatran etexilate mesylate]   Social History: The patient  reports that he quit smoking about 46 years ago. His smoking use included Cigarettes. He has a 12.00 pack-year smoking history. He has never used smokeless tobacco. He reports that he does not drink alcohol or use drugs.   Family History: The patient's family history includes Diabetes in his other; Hypertension in his other.   ROS:  Please see the history of present illness. Otherwise, complete review of systems is positive for {NONE DEFAULTED:18576::"none"}.  All other systems are reviewed and negative.   Physical Exam: VS:  There were no vitals taken for this visit., BMI There is no height or weight on file to calculate BMI.  Wt Readings from Last 3 Encounters:  05/25/16 200 lb 12.8 oz (91.1 kg)  03/15/16 196 lb (88.9 kg)  03/07/16 190 lb (86.2 kg)    Overweight elderly male, no distress. HEENT: Conjunctiva and lids normal, oropharynx clear.  Neck: Supple, no elevated JVP, no carotid bruits, no thyromegaly.  Lungs: Clear to auscultation, diminished. Cardiac Irregularly irregular, no S3, indistinct PMI.  Abdomen: Nontender, bowel sounds present, no guarding or rebound.  Extremities: 1+ leg edema, right worse than left, distal pulses 1-2+.  ECG: I personally reviewed the tracing from 05/25/2016 which showed atrial fibrillation with ventricular pacing at 58 bpm.  Recent Labwork: 03/07/2016: ALT 14; AST 18; Hemoglobin 14.1; Platelets 134 03/15/2016: B Natriuretic Peptide 410.3; BUN 53; Creatinine, Ser 1.83; Potassium 4.9; Sodium  136   Other Studies Reviewed Today:  Echocardiogram 10/08/2015: Study Conclusions  - Left ventricle: The cavity size was at the upper limits of  normal. Wall thickness was increased in a pattern of mild LVH.  The estimated ejection fraction was 35%. There is akinesis of the  mid-apicalanteroseptal and apical myocardium. There is  hypokinesis of the apicalinferior myocardium. Features are  consistent with a pseudonormal left ventricular filling pattern,  with concomitant abnormal relaxation and increased filling  pressure (grade 2 diastolic dysfunction). - Aortic valve: Mildly to moderately calcified annulus. Trileaflet;  mildly thickened, mildly calcified leaflets. - Aortic root: The aortic root was mildly dilated. - Mitral valve: Calcified annulus. Mildly calcified leaflets .  There was mild to moderate regurgitation. - Left atrium: The atrium was mildly dilated. - Right atrium: Central venous pressure (est): 15 mm Hg. - Tricuspid valve: There was mild regurgitation. - Pulmonary arteries: PA peak pressure: 65 mm  Hg (S). - Pericardium, extracardiac: There was no pericardial effusion.  Impressions:  - Mild LVH with upper normal LV chamber size and LVEF approximately  35%. There is akinesis of the mid to apical anteroseptal wall and  apex with hypokinesis of the inferior apical wall consistent with  ischemic cardiomyopathy. Grade 2 diastolic dysfunction with  increased LV filling pressure. Mild left atrial enlargement.  Moderate MAC with mildly calcified mitral leaflets and mild to  moderate mitral regurgitation. Mildly dilated aortic root.  Sclerotic aortic valve without stenosis. Mild tricuspid  regurgitation. Normal RV contraction. Pulmonic hypertension with  PASP estimated 65 mmHg.  Assessment and Plan:    Current medicines were reviewed with the patient today.  No orders of the defined types were placed in this  encounter.   Disposition:  Signed, Satira Sark, MD, [facility] 05/31/2016 9:33 AM    [facility] [place], [place], [place] [address] Phone: [phone]; Fax: [phone]

## 2016-06-01 ENCOUNTER — Ambulatory Visit: Payer: Medicare Other | Admitting: Cardiology

## 2016-06-01 DIAGNOSIS — I482 Chronic atrial fibrillation: Secondary | ICD-10-CM | POA: Diagnosis not present

## 2016-06-02 ENCOUNTER — Encounter: Payer: Self-pay | Admitting: Cardiology

## 2016-06-08 ENCOUNTER — Other Ambulatory Visit (HOSPITAL_COMMUNITY): Payer: Self-pay | Admitting: Cardiology

## 2016-06-14 ENCOUNTER — Encounter (HOSPITAL_COMMUNITY): Payer: Medicare Other

## 2016-06-21 ENCOUNTER — Other Ambulatory Visit (HOSPITAL_COMMUNITY): Payer: Self-pay | Admitting: Cardiology

## 2016-06-21 ENCOUNTER — Other Ambulatory Visit: Payer: Self-pay | Admitting: Cardiology

## 2016-06-22 DIAGNOSIS — I481 Persistent atrial fibrillation: Secondary | ICD-10-CM | POA: Diagnosis not present

## 2016-06-22 NOTE — Progress Notes (Signed)
Cardiology Office Note  Date: 06/23/2016   ID: [name], DOB 08-14-1929, MRN [id number]  PCP: Rory Percy, MD  Primary Cardiologist: Rozann Lesches, MD   Chief Complaint  Patient presents with  . Coronary Artery Disease  . Cardiomyopathy  . Atrial Fibrillation    History of Present Illness: [name] [name] is a medically complex 82 y.o. male last seen in June, he missed his last scheduled follow-up visit with me. He has had evaluation in the heart failure clinic as of August, and a more recent device interrogation in October. He presents today with his wife for a follow-up visit. He seems to be doing reasonably well in terms of functional capacity, particular compared to the last time that I saw him. His weight has been relatively stable, he states that he has not had to use any extra Demadex. I do not see that he has had any follow-up lab work in terms of renal function and hemoglobin.  We went over his medications which are outlined below. He reports compliance except that he often misses his middle of the day dose of hydralazine. He does not report any palpitations or chest pain. Main functional limitations are related to chronic leg pain and lower back pain.  He remains on Coumadin with follow-up per Dr. Nadara Mustard. Denies any bleeding problems.  He has recently started on CPAP for treatment of OSA following sleep study in [place].  Past Medical History:  Diagnosis Date  . Anemia   . Arthritis   . Bradycardia    a. s/p SJM nanostim leadless pacemaker implanted by Dr Rayann Heman  . Chronic atrial fibrillation ([place])    a. Failed prior DCCV x 2; b. chronic coumadin.  . Chronic systolic heart failure ([place])    a. EF 40-45% by echo 2012.  . CKD (chronic kidney disease) stage 3, GFR 30-59 ml/min   . Coronary atherosclerosis of native coronary artery    a. s/p MI 1998;  b. Chronically occluded LAD, patent DES diagonal, subtotally occluded anomalous circumflex - failed prior  PCI;  b. 04/2013 Cath/PCI: LM 95d (3.0x16 Promus Premier DES), LAD 00, D1 patent stent, LCX small 100, RCA min irregs, R->L Collats.  . CVA (cerebral vascular accident) ([place]) 09/2010   Suspected embolic while off anticoagulation.  . Essential hypertension, benign   . GI bleed    December 2011 - subsequent discontinuation of Pradaxa  . History of blood transfusion 09/2010; 12/2012  . History of pneumonia 1970's  . Ischemic cardiomyopathy    LVEF 45%  . Mixed hyperlipidemia   . Pacemaker 01/18/2013   STJ Nanostim Leadless pacemaker implanted by Dr Rayann Heman  . Right bundle branch block   . Type II diabetes mellitus ([place])     Past Surgical History:  Procedure Laterality Date  . CARDIAC CATHETERIZATION N/A 10/02/2015   Procedure: Right/Left Heart Cath and Coronary Angiography;  Surgeon: Sherren Mocha, MD;  Location: [facility];  Service: Cardiovascular;  Laterality: N/A;  . COLONOSCOPY  Oct 12, 2015   Dr. Anthony Sar: sessile polyp at cecum (tubular adenoma), moderate diverticulosis in sigmoid and descending colon, moderate amount of old blood but no active bleeding  . CYSTECTOMY  1951  . ESOPHAGOGASTRODUODENOSCOPY  Oct 12, 2015   Dr. Anthony Sar: normal   . LEFT HEART CATHETERIZATION WITH CORONARY ANGIOGRAM N/A 05/03/2013   Procedure: LEFT HEART CATHETERIZATION WITH CORONARY ANGIOGRAM;  Surgeon: Peter M Martinique, MD;  Location: [facility] CATH LAB;  Service: Cardiovascular;  Laterality: N/A;  .  LUMBAR DISC SURGERY  2000's  . PACEMAKER INSERTION  01/18/2013   STJ Nanostim Leadless pacemaker implanted by Dr Rayann Heman  . PERMANENT PACEMAKER INSERTION N/A 01/18/2013   Procedure: PERMANENT PACEMAKER INSERTION;  Surgeon: Thompson Grayer, MD;  Location: [facility] CATH LAB;  Service: Cardiovascular;  Laterality: N/A;    Current Outpatient Prescriptions  Medication Sig Dispense Refill  . Ascorbic Acid (VITAMIN C) 1000 MG tablet Take 1,000 mg by mouth daily.    Marland Kitchen atorvastatin (LIPITOR) 10 MG tablet TAKE ONE TABLET BY MOUTH  EVERY DAY 30 tablet 6  . carvedilol (COREG) 25 MG tablet Take 25 mg by mouth 2 (two) times daily with a meal.    . guaiFENesin (MUCINEX) 600 MG 12 hr tablet Take 600 mg by mouth at bedtime as needed.    . hydrALAZINE (APRESOLINE) 10 MG tablet Take 1 tablet (10 mg total) by mouth 3 (three) times daily. 90 tablet 6  . IRON, FERROUS GLUCONATE, PO Take 27 mg of iron by mouth 2 (two) times daily.    . isosorbide mononitrate (IMDUR) 30 MG 24 hr tablet TAKE ONE TABLET BY MOUTH DAILY. 30 tablet 3  . LANTUS 100 UNIT/ML injection Inject 30 Units into the skin at bedtime.     . nitroGLYCERIN (NITROSTAT) 0.4 MG SL tablet Place 1 tablet (0.4 mg total) under the tongue every 5 (five) minutes x 3 doses as needed for chest pain. 25 tablet 3  . spironolactone (ALDACTONE) 25 MG tablet TAKE 1/2 TABLET BY MOUTH DAILY 15 tablet 3  . torsemide (DEMADEX) 20 MG tablet Take 40mg  in the AM and 20mg  in the PM 120 tablet 3  . warfarin (COUMADIN) 2 MG tablet Take 2-3 mg by mouth See admin instructions. MANAGED BY PMD     No current facility-administered medications for this visit.    Allergies:  Colcrys [colchicine]; Contrast media [iodinated diagnostic agents]; Penicillins; and Pradaxa [dabigatran etexilate mesylate]   Social History: The patient  reports that he quit smoking about 46 years ago. His smoking use included Cigarettes. He has a 12.00 pack-year smoking history. He has never used smokeless tobacco. He reports that he does not drink alcohol or use drugs.   ROS:  Please see the history of present illness. Otherwise, complete review of systems is positive for feeling of "skin crawling" on the back of his right neck that goes away when he scratches it.  All other systems are reviewed and negative.   Physical Exam: VS:  BP 136/72   Pulse 69   Ht 5\' 9"  (1.753 m)   Wt 202 lb (91.6 kg)   SpO2 100%   BMI 29.83 kg/m , BMI Body mass index is 29.83 kg/m.  Wt Readings from Last 3 Encounters:  06/23/16 202 lb (91.6  kg)  05/25/16 200 lb 12.8 oz (91.1 kg)  03/15/16 196 lb (88.9 kg)    General: Patient appears comfortable at rest. HEENT: Conjunctiva and lids normal, oropharynx clear with moist mucosa. Neck: Supple, no elevated JVP or carotid bruits, no thyromegaly. Lungs: Clear to auscultation, nonlabored breathing at rest. Cardiac: Irregular, no S3 or significant systolic murmur, no pericardial rub. Abdomen: Soft, nontender, bowel sounds present, no guarding or rebound. Extremities: Mild ankle edema, distal pulses 2+. Skin: Warm and dry. Musculoskeletal: No kyphosis. Neuropsychiatric: Alert and oriented x3, affect grossly appropriate.  ECG: I personally reviewed the tracing from 05/25/2016 which showed a ventricular paced rhythm with underlying atrial fibrillation.  Recent Labwork: 03/07/2016: ALT 14; AST 18; Hemoglobin 14.1; Platelets  134 03/15/2016: B Natriuretic Peptide 410.3; BUN 53; Creatinine, Ser 1.83; Potassium 4.9; Sodium 136   Other Studies Reviewed Today:  Echocardiogram 10/08/2015: Study Conclusions  - Left ventricle: The cavity size was at the upper limits of   normal. Wall thickness was increased in a pattern of mild LVH.   The estimated ejection fraction was 35%. There is akinesis of the   mid-apicalanteroseptal and apical myocardium. There is   hypokinesis of the apicalinferior myocardium. Features are   consistent with a pseudonormal left ventricular filling pattern,   with concomitant abnormal relaxation and increased filling   pressure (grade 2 diastolic dysfunction). - Aortic valve: Mildly to moderately calcified annulus. Trileaflet;   mildly thickened, mildly calcified leaflets. - Aortic root: The aortic root was mildly dilated. - Mitral valve: Calcified annulus. Mildly calcified leaflets .   There was mild to moderate regurgitation. - Left atrium: The atrium was mildly dilated. - Right atrium: Central venous pressure (est): 15 mm Hg. - Tricuspid valve: There was mild  regurgitation. - Pulmonary arteries: PA peak pressure: 65 mm Hg (S). - Pericardium, extracardiac: There was no pericardial effusion.  Impressions:  - Mild LVH with upper normal LV chamber size and LVEF approximately   35%. There is akinesis of the mid to apical anteroseptal wall and   apex with hypokinesis of the inferior apical wall consistent with   ischemic cardiomyopathy. Grade 2 diastolic dysfunction with   increased LV filling pressure. Mild left atrial enlargement.   Moderate MAC with mildly calcified mitral leaflets and mild to   moderate mitral regurgitation. Mildly dilated aortic root.   Sclerotic aortic valve without stenosis. Mild tricuspid   regurgitation. Normal RV contraction. Pulmonic hypertension with   PASP estimated 65 mmHg.  Assessment and Plan:  1. Chronic systolic heart failure with ischemic cardiomyopathy and LVEF approximately 35%. He has actually done fairly well on current medications and with follow-up in the heart failure clinic. I reinforced compliance with regular use of his hydralazine. Current dose of Demadex seems to be fairly effective in keeping his weight stable. We will obtain a follow-up BMET. Otherwise no changes were made today.  2. Multivessel CAD as outlined above, doing well without angina symptoms on medical therapy. He is no longer on antiplatelet agents with ongoing use of Coumadin.  3. Chronic atrial fibrillation, continuing with strategy of heart rate control and anticoagulation. Coumadin is followed by Dr. Nadara Mustard. Check CBC. He does have a prior history of GI bleeding.  4. CKD stage 3, follow-up BMET.  5. OSA, recently started on CPAP following titration.  Current medicines were reviewed with the patient today.   Orders Placed This Encounter  Procedures  . CBC  . Basic Metabolic Panel (BMET)    Disposition: Follow up in 4 months with interval visit per heart failure clinic.  Signed, Satira Sark, MD, [facility] 06/23/2016  4:47 PM    [facility] [place], [place], [place] [address] Phone: [phone]; Fax: [phone]

## 2016-06-23 ENCOUNTER — Encounter: Payer: Self-pay | Admitting: Cardiology

## 2016-06-23 ENCOUNTER — Ambulatory Visit (INDEPENDENT_AMBULATORY_CARE_PROVIDER_SITE_OTHER): Payer: Medicare Other | Admitting: Cardiology

## 2016-06-23 VITALS — BP 136/72 | HR 69 | Ht 69.0 in | Wt 202.0 lb

## 2016-06-23 DIAGNOSIS — N183 Chronic kidney disease, stage 3 unspecified: Secondary | ICD-10-CM

## 2016-06-23 DIAGNOSIS — I482 Chronic atrial fibrillation, unspecified: Secondary | ICD-10-CM

## 2016-06-23 DIAGNOSIS — I5042 Chronic combined systolic (congestive) and diastolic (congestive) heart failure: Secondary | ICD-10-CM

## 2016-06-23 DIAGNOSIS — G4733 Obstructive sleep apnea (adult) (pediatric): Secondary | ICD-10-CM

## 2016-06-23 DIAGNOSIS — I2510 Atherosclerotic heart disease of native coronary artery without angina pectoris: Secondary | ICD-10-CM

## 2016-06-23 NOTE — Patient Instructions (Signed)
Your physician wants you to follow-up in: 4 MONTHS WITH DR. Domenic Polite You will receive a reminder letter in the mail two months in advance. If you don't receive a letter, please call our office to schedule the follow-up appointment.  Your physician recommends that you continue on your current medications as directed. Please refer to the Current Medication list given to you today.  Your physician recommends that you return for lab work CBC/BMP  Thank you for choosing [facility]!!

## 2016-06-24 DIAGNOSIS — I5042 Chronic combined systolic (congestive) and diastolic (congestive) heart failure: Secondary | ICD-10-CM | POA: Diagnosis not present

## 2016-06-28 ENCOUNTER — Telehealth: Payer: Self-pay | Admitting: *Deleted

## 2016-06-28 NOTE — Telephone Encounter (Signed)
-----   Message from Massie Maroon, [place] sent at 06/28/2016 11:37 AM EST -----   ----- Message ----- From: Satira Sark, MD Sent: 06/27/2016   5:34 PM To: Larey Dresser, MD, Merlene Laughter, LPN  Results reviewed. Creatinine is up to 2.1 from 1.8-1.9 range, but normal potassium and stable weights at recent followup. Since has been doing well clinically, no change in Demadex dose for now (it was already cut back three months ago). Hgb 11.7 is down somewhat from prior, but no obvious clinical bleeding. Follow-up in CHF clinic pending. A copy of this test should be forwarded to Rory Percy, MD.

## 2016-06-28 NOTE — Telephone Encounter (Signed)
Pt aware - routed to pcp  

## 2016-06-30 DIAGNOSIS — G4733 Obstructive sleep apnea (adult) (pediatric): Secondary | ICD-10-CM | POA: Diagnosis not present

## 2016-07-06 ENCOUNTER — Telehealth (HOSPITAL_COMMUNITY): Payer: Self-pay | Admitting: Cardiology

## 2016-07-06 NOTE — Telephone Encounter (Signed)
Message left on answering line to reschedule appt

## 2016-07-11 ENCOUNTER — Ambulatory Visit (INDEPENDENT_AMBULATORY_CARE_PROVIDER_SITE_OTHER): Payer: Medicare Other | Admitting: Gastroenterology

## 2016-07-11 ENCOUNTER — Encounter: Payer: Self-pay | Admitting: Gastroenterology

## 2016-07-11 DIAGNOSIS — R195 Other fecal abnormalities: Secondary | ICD-10-CM | POA: Diagnosis not present

## 2016-07-11 NOTE — Progress Notes (Signed)
CC'D TO PCP °

## 2016-07-11 NOTE — Patient Instructions (Signed)
Start taking extra fiber daily such as Metamucil or Benefiber. If you get discomfort with taking it, then decrease the dose.   Start taking a probiotic such as [employer], Restora, [employer], Digestive Advantage, [employer] brand. All of these are over the counter.  Call me if things continue.   We will see you in 3 months!

## 2016-07-11 NOTE — Progress Notes (Signed)
Referring Provider: Rory Percy, MD Primary Care Physician:  Rory Percy, MD  Primary GI: Dr. Oneida Alar   Chief Complaint  Patient presents with  . Follow-up    alot of gas/ Bm's everyday    HPI:   [name] [name] is a 82 y.o. male presenting today with a history of GI bleed, hospitalized in [place] with colonoscopy 10/12/15 by Dr. Anthony Sar with sessile polyp at cecum (tubular adenoma), moderate diverticulosis in sigmoid and descending colon, moderate amount of old blood but no active bleeding, EGD normal. He has a significant cardiac history and is on Plavix and aspirin. It was felt the benefits of anticoagulation outweighed the risk at this point, due to his complicated cardiac history. He is not an ideal candidate for a capsule study due to presence of pacemaker. Further imaging has not been undertaken (such as CTE) due to renal disease, although this was discussed with Dr. Thornton Papas who stated reduced dosing of contrast could be used. However, he has had fluctuations in kidney function and states that the last imaging he had, he went into kidney failure. Therefore, imaging has been on hold. Hgb was 9.1 at discharge in Feb 2017, ferritin 30, iron 26. May 2017 ferritin 61, iron 73, Hgb 12.7. muc  Returns today in follow-up.  Has taken Linzess 145 mcg historically for constipation, but he was doing well in May 2017 when last seen. Recently has had episodes of feeling constipated but then has really loose stool. This morning at 4am had to urinate and had watery stool. At 7am had just a small amount of wetness in underwear. States it looked like mucus in his underwear. Stomach is growling a lot now. Has a lot of gas. Every once in awhile will have loose stool. Has a BM about every day, sometimes every other day. Stool is not well-formed, more like "tiny bits of stuff".   Past Medical History:  Diagnosis Date  . Anemia   . Arthritis   . Bradycardia    a. s/p SJM nanostim leadless pacemaker  implanted by Dr Rayann Heman  . Chronic atrial fibrillation ([place])    a. Failed prior DCCV x 2; b. chronic coumadin.  . Chronic systolic heart failure ([place])    a. EF 40-45% by echo 2012.  . CKD (chronic kidney disease) stage 3, GFR 30-59 ml/min   . Coronary atherosclerosis of native coronary artery    a. s/p MI 1998;  b. Chronically occluded LAD, patent DES diagonal, subtotally occluded anomalous circumflex - failed prior PCI;  b. 04/2013 Cath/PCI: LM 95d (3.0x16 Promus Premier DES), LAD 00, D1 patent stent, LCX small 100, RCA min irregs, R->L Collats.  . CVA (cerebral vascular accident) ([place]) 09/2010   Suspected embolic while off anticoagulation.  . Essential hypertension, benign   . GI bleed    December 2011 - subsequent discontinuation of Pradaxa  . History of blood transfusion 09/2010; 12/2012  . History of pneumonia 1970's  . Ischemic cardiomyopathy    LVEF 45%  . Mixed hyperlipidemia   . Pacemaker 01/18/2013   STJ Nanostim Leadless pacemaker implanted by Dr Rayann Heman  . Right bundle branch block   . Type II diabetes mellitus ([place])     Past Surgical History:  Procedure Laterality Date  . CARDIAC CATHETERIZATION N/A 10/02/2015   Procedure: Right/Left Heart Cath and Coronary Angiography;  Surgeon: Sherren Mocha, MD;  Location: [facility] LAB;  Service: Cardiovascular;  Laterality: N/A;  . COLONOSCOPY  Oct 12, 2015  Dr. Anthony Sar: sessile polyp at cecum (tubular adenoma), moderate diverticulosis in sigmoid and descending colon, moderate amount of old blood but no active bleeding  . CYSTECTOMY  1951  . ESOPHAGOGASTRODUODENOSCOPY  Oct 12, 2015   Dr. Anthony Sar: normal   . LEFT HEART CATHETERIZATION WITH CORONARY ANGIOGRAM N/A 05/03/2013   Procedure: LEFT HEART CATHETERIZATION WITH CORONARY ANGIOGRAM;  Surgeon: Peter M Martinique, MD;  Location: [facility] CATH LAB;  Service: Cardiovascular;  Laterality: N/A;  . LUMBAR DISC SURGERY  2000's  . PACEMAKER INSERTION  01/18/2013   STJ Nanostim Leadless pacemaker  implanted by Dr Rayann Heman  . PERMANENT PACEMAKER INSERTION N/A 01/18/2013   Procedure: PERMANENT PACEMAKER INSERTION;  Surgeon: Thompson Grayer, MD;  Location: [facility] C[facility] LAB;  Service: Cardiovascular;  Laterality: N/A;    Current Outpatient Prescriptions  Medication Sig Dispense Refill  . Ascorbic Acid (VITAMIN C) 1000 MG tablet Take 1,000 mg by mouth daily.    Marland Kitchen atorvastatin (LIPITOR) 10 MG tablet TAKE ONE TABLET BY MOUTH EVERY DAY 30 tablet 6  . carvedilol (COREG) 25 MG tablet Take 25 mg by mouth 2 (two) times daily with a meal.    . guaiFENesin (MUCINEX) 600 MG 12 hr tablet Take 600 mg by mouth at bedtime as needed.    . hydrALAZINE (APRESOLINE) 10 MG tablet Take 1 tablet (10 mg total) by mouth 3 (three) times daily. 90 tablet 6  . IRON, FERROUS GLUCONATE, PO Take 27 mg of iron by mouth 2 (two) times daily.    . isosorbide mononitrate (IMDUR) 30 MG 24 hr tablet TAKE ONE TABLET BY MOUTH DAILY. 30 tablet 3  . LANTUS 100 UNIT/ML injection Inject 30 Units into the skin at bedtime.     . nitroGLYCERIN (NITROSTAT) 0.4 MG SL tablet Place 1 tablet (0.4 mg total) under the tongue every 5 (five) minutes x 3 doses as needed for chest pain. 25 tablet 3  . spironolactone (ALDACTONE) 25 MG tablet TAKE 1/2 TABLET BY MOUTH DAILY 15 tablet 3  . torsemide (DEMADEX) 20 MG tablet Take 40mg  in the AM and 20mg  in the PM 120 tablet 3  . warfarin (COUMADIN) 2 MG tablet Take 2-3 mg by mouth See admin instructions. MANAGED BY PMD     No current facility-administered medications for this visit.     Allergies as of 07/11/2016 - Review Complete 06/23/2016  Allergen Reaction Noted  . Colcrys [colchicine] Other (See Comments) 02/19/2013  . Contrast media [iodinated diagnostic agents] Other (See Comments) 12/03/2012  . Penicillins Swelling   . Pradaxa [dabigatran etexilate mesylate] Other (See Comments) 10/09/2014    Family History  Problem Relation Age of Onset  . Diabetes Other   . Hypertension Other   . Colon cancer  Neg Hx     Social History   Social History  . Marital status: Married    Spouse name: [name]  . Number of children: N/A  . Years of education: N/A   Occupational History  . RETIRED   .  Retired   Social History Main Topics  . Smoking status: Former Smoker    Packs/day: 0.30    Years: 40.00    Types: Cigarettes    Quit date: 08/15/1969  . Smokeless tobacco: Never Used  . Alcohol use No  . Drug use: No  . Sexual activity: No   Other Topics Concern  . None   Social History Narrative  . None    Review of Systems: As mentioned in HPI   Physical Exam: BP 121/69  Pulse 66   Temp 97.7 F (36.5 C) (Oral)   Ht 5\' 9"  (1.753 m)   Wt 198 lb (89.8 kg)   BMI 29.24 kg/m  General:   Alert and oriented. No distress noted. Pleasant and cooperative.  Head:  Normocephalic and atraumatic. Eyes:  Conjuctiva clear without scleral icterus. Abdomen:  +BS, soft, non-tender and non-distended. No rebound or guarding. Umbilical hernia noted.  Msk:  Symmetrical without gross deformities. Normal posture. Extremities:  Without edema. Neurologic:  Alert and  oriented x4 Psych:  Alert and cooperative. Normal mood and affect.   Lab Results  Component Value Date   WBC 7.1 03/07/2016   HGB 14.1 03/07/2016   HCT 40.9 03/07/2016   MCV 96.7 03/07/2016   PLT 134 (L) 03/07/2016   Lab Results  Component Value Date   IRON 73 01/05/2016   TIBC 352 01/05/2016   FERRITIN 61 01/05/2016

## 2016-07-11 NOTE — Assessment & Plan Note (Signed)
82 year old male with history of constipation, now presenting with intermittent small amounts of mucus per rectum, occasional loose stool. Denies persistent diarrhea and has normally formed BMs most of the time. Notes gas. Colonoscopy is up-to-date as of Feb 2017. Previously has used Linzess 145 mcg daily. Will add supplemental fiber daily, a probiotic, and have him return in 3 months. I feel his intermittent loose stool may very well be dietary related. Unclear source of mucus but no concerning features. Discussed avoidance of straining, improving bowel habits, and will follow closely.

## 2016-07-18 ENCOUNTER — Encounter (HOSPITAL_COMMUNITY): Payer: Self-pay

## 2016-07-18 ENCOUNTER — Ambulatory Visit (HOSPITAL_COMMUNITY)
Admission: RE | Admit: 2016-07-18 | Discharge: 2016-07-18 | Disposition: A | Discharge status: Home / Self Care | Payer: Medicare Other | Source: Ambulatory Visit | Attending: Cardiology | Admitting: Cardiology

## 2016-07-18 VITALS — BP 136/68 | HR 65 | Wt 202.0 lb

## 2016-07-18 DIAGNOSIS — I482 Chronic atrial fibrillation, unspecified: Secondary | ICD-10-CM

## 2016-07-18 DIAGNOSIS — Z8673 Personal history of transient ischemic attack (TIA), and cerebral infarction without residual deficits: Secondary | ICD-10-CM | POA: Insufficient documentation

## 2016-07-18 DIAGNOSIS — Z87891 Personal history of nicotine dependence: Secondary | ICD-10-CM | POA: Insufficient documentation

## 2016-07-18 DIAGNOSIS — G4733 Obstructive sleep apnea (adult) (pediatric): Secondary | ICD-10-CM | POA: Diagnosis not present

## 2016-07-18 DIAGNOSIS — I5042 Chronic combined systolic (congestive) and diastolic (congestive) heart failure: Secondary | ICD-10-CM

## 2016-07-18 DIAGNOSIS — I130 Hypertensive heart and chronic kidney disease with heart failure and stage 1 through stage 4 chronic kidney disease, or unspecified chronic kidney disease: Secondary | ICD-10-CM | POA: Diagnosis not present

## 2016-07-18 DIAGNOSIS — N183 Chronic kidney disease, stage 3 unspecified: Secondary | ICD-10-CM

## 2016-07-18 DIAGNOSIS — I5022 Chronic systolic (congestive) heart failure: Secondary | ICD-10-CM | POA: Insufficient documentation

## 2016-07-18 DIAGNOSIS — I255 Ischemic cardiomyopathy: Secondary | ICD-10-CM | POA: Diagnosis not present

## 2016-07-18 DIAGNOSIS — Z79899 Other long term (current) drug therapy: Secondary | ICD-10-CM | POA: Insufficient documentation

## 2016-07-18 DIAGNOSIS — I2510 Atherosclerotic heart disease of native coronary artery without angina pectoris: Secondary | ICD-10-CM | POA: Insufficient documentation

## 2016-07-18 DIAGNOSIS — Z7901 Long term (current) use of anticoagulants: Secondary | ICD-10-CM | POA: Insufficient documentation

## 2016-07-18 LAB — BASIC METABOLIC PANEL WITH GFR
Anion gap: 7 (ref 5–15)
BUN: 43 mg/dL — ABNORMAL HIGH (ref 6–20)
CO2: 24 mmol/L (ref 22–32)
Calcium: 9.1 mg/dL (ref 8.9–10.3)
Chloride: 105 mmol/L (ref 101–111)
Creatinine, Ser: 1.8 mg/dL — ABNORMAL HIGH (ref 0.61–1.24)
GFR calc Af Amer: 38 mL/min — ABNORMAL LOW
GFR calc non Af Amer: 33 mL/min — ABNORMAL LOW
Glucose, Bld: 135 mg/dL — ABNORMAL HIGH (ref 65–99)
Potassium: 4 mmol/L (ref 3.5–5.1)
Sodium: 136 mmol/L (ref 135–145)

## 2016-07-18 LAB — BRAIN NATRIURETIC PEPTIDE: B Natriuretic Peptide: 348.9 pg/mL — ABNORMAL HIGH (ref 0.0–100.0)

## 2016-07-18 MED ORDER — TORSEMIDE 20 MG PO TABS: ORAL_TABLET | ORAL | 3 refills | Status: DC

## 2016-07-18 MED ORDER — HYDRALAZINE HCL 10 MG PO TABS: 20.0000 mg | ORAL_TABLET | Freq: Two times a day (BID) | ORAL | 6 refills | Status: AC

## 2016-07-18 NOTE — Progress Notes (Signed)
Patient ID: [name] [name], male   DOB: 26-Nov-1928, 82 y.o.   MRN: [id number] PCP: Dr. Nadara Mustard Cardiology: Dr. Domenic Polite HF Cardiology: Dr. Aundra Dubin  82 yo with history of chronic atrial fibrillation, coronary artery disease, chronic systolic CHF/ischemic cardiomyopathy, GI bleeding, and CKD presents for CHF clinic evaluation.  Patient has failed DCCV x 2 in the past and remains in atrial fibrillation with controlled rate today.  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  The RCA was patent and a left main stent was patent.  He had had left main stent placed back in 9/14 when he presented with NSTEMI and 95% distal LM stenosis.  Last echo in 3/17 showed EF 35-40% with regional wall motion abnormalities.  Of note, he has a Optician, dispensing) placed because of symptomatic bradycardia.   Patient was admitted to [facility] in 2/17 with GI bleed.  He had 4 units PRBCs.  He has had colonoscopy and EGD without localization of bleeding source.  He is now off Plavix and only on warfarin.  No further BRBPR or melena.   I was concerned about frequent RV pacing.  He saw Dr Rayann Heman who decreased his lower rate to 50 to decrease RV pacing. He recommended against CRT-P.  He is generally stable.  Now using CPAP (severe, complex OSA).  No dyspnea walking on flat ground.  No orthopnea/PND. Low back pain is improved so he is getting around more.  Legs are weak but he denies claudication.  No chest pain.  He has had increased lower extremity edema recently and wight is up about 6 lbs.  BP 130s/60s.        Labs (3/17): K 4.4 => 5, creatinine 1.4 => 2.10, HCT 32 Labs (4/17): K 5, creatinine 1.93 Labs (7/17): K 4.3, creatinine 1.93, hgb 14.1 Labs (11/17): K 4.5, creatinine 2.06, hgb 11.7  PMH: 1. Atrial fibrillation: Chronic. He has failed DCCV x 2.  He is on warfarin.  2. H/o GI bleeding: He was admitted to [facility] in 2/17 and had 4  units PRBCs.  He had EGD and colonscopy without identification of bleeding site.   3. CKD stage III 4. HTN 5. CAD: MI in 1998.  Has anomalous left LCx off right cusp. - NSTEMI (9/14): 95% distal LM (supplied moderate D1), CTO LAD after D1, RCA patent, small anomalous LCx off right cusp was occluded => DES to LM. - LHC (2/17) with no change to CTO pLAD after D1 or CTO LCx.  RCA was patent and LM stent was patent.  No changes.  6. Chronic systolic CHF: Ischemic cardiomyopathy.  - Echo (3/17) with EF 35%, anteroseptal and apical akinesis, PASP 65 mmHg.  - RHC (2/17) with mean RA 9, PA 52/15 mean 31, mean PCWP 20, CI 2.89, PVR 1.8 WU.  7. Symptomatic bradycardia: Mobitz type II 2nd degree AV block.  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   8. 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  9. Low back pain 10. OSA: Severe, mixed central and obstructive  SH: Lives in [place] with wife.  Remote tobacco use.   Family History  Problem Relation Age of Onset  . Diabetes Other   . Hypertension Other   . Colon cancer Neg Hx    ROS: All systems reviewed and negative except as per HPI.   Current Outpatient Prescriptions  Medication Sig Dispense Refill  . Ascorbic Acid (VITAMIN C)  1000 MG tablet Take 1,000 mg by mouth daily.    Marland Kitchen atorvastatin (LIPITOR) 10 MG tablet TAKE ONE TABLET BY MOUTH EVERY DAY 30 tablet 6  . carvedilol (COREG) 25 MG tablet Take 25 mg by mouth 2 (two) times daily with a meal.    . guaiFENesin (MUCINEX) 600 MG 12 hr tablet Take 600 mg by mouth at bedtime as needed.    . hydrALAZINE (APRESOLINE) 10 MG tablet Take 2 tablets (20 mg total) by mouth 2 (two) times daily. 120 tablet 6  . IRON, FERROUS GLUCONATE, PO Take 325 mg of iron by mouth daily.     . isosorbide mononitrate (IMDUR) 30 MG 24 hr tablet TAKE ONE TABLET BY MOUTH DAILY. 30 tablet 3  . LANTUS 100 UNIT/ML injection Inject 30 Units into the skin at bedtime.     . nitroGLYCERIN (NITROSTAT) 0.4 MG SL tablet Place 1 tablet (0.4 mg total) under the  tongue every 5 (five) minutes x 3 doses as needed for chest pain. 25 tablet 3  . spironolactone (ALDACTONE) 25 MG tablet TAKE 1/2 TABLET BY MOUTH DAILY 15 tablet 3  . torsemide (DEMADEX) 20 MG tablet Take 40mg  in the AM and 20mg  in the PM every other day ALTERNATING with 40 mg Twice daily every other day 120 tablet 3  . warfarin (COUMADIN) 2 MG tablet Take 2-3 mg by mouth See admin instructions. MANAGED BY PMD     No current facility-administered medications for this encounter.    BP 136/68   Pulse 65   Wt 202 lb (91.6 kg)   SpO2 98%   BMI 29.83 kg/m  General: NAD Neck: JVP 10 cm, no thyromegaly or thyroid nodule.  Lungs: Clear to auscultation bilaterally with normal respiratory effort. CV: Nondisplaced PMI.  Heart irregular S1/S2, no S3/S4, no murmur.  1+ edema 1/2 up lower legs bilaterally.  No carotid bruit.  Normal pedal pulses.  Abdomen: Soft, nontender, no hepatosplenomegaly, no distention.  Skin: Intact without lesions or rashes.  Neurologic: Alert and oriented x 3.  Psych: Normal affect. Extremities: No clubbing or cyanosis.  HEENT: Normal.   Assessment/Plan: 1. CAD: Recent cath in 2/17 with patent LM stent, chronic occlusion of pLAD and small anomalous LCx.  No chest pain.  - Continue statin.  - Given GI bleeding, I stopped his Plavix given ongoing warfarin use.  LM stent was in 9/14 so has been several years ago.  He needs long-term warfarin.   2. Atrial fibrillation: Chronic, failed DCCV x 2 in the past.  I suspect that he would be difficult to cardiovert and keep in NSR as it appears that he has been in atrial fibrillation since at least 2013. - He will continue warfarin. Would bridge with Lovenox if he has to come off warfarin in the future given history of CVA.  - He is now off diltiazem CD with low EF.  He remains on Coreg.  3. H/o GI bleeding: He had EGD and c-scope without identification of source.  He was given 4 units PRBCs at [facility] in 2/17. No further melena or  BRBPR.  Hemoglobin stable when checked in 11/17.  4. Chronic systolic CHF: Ischemic cardiomyopathy, EF 35% on 3/17 echo.  Elevated PA systolic pressure by echo likely reflective of elevated left atrial pressure.  NYHA class II-III symptoms.  He looks a bit volume overloaded today.  I had increased his torsemide in the past then decreased it again with rise in creatinine.   - I will  increase torsemide to 40 mg bid alternating with 40 qam/20 qpm. BMET/BNP today and repeat in 10 days.   - Continue Coreg 25 bid.  - Continue spironolactone 12.5 mg daily, will not increase given subjective "weakness." - Would hold off on ACEI/ARB with CKD.  He can only remember to take hydralazine bid. Increase hydralazine to 20 mg bid with Imdur 30 daily.  5. Symptomatic bradycardia: He has a Pharmacist, hospital PPM.  Dr Allred decreased lower rate limit to try to limit RV pacing.  6. OSA: Now on CPAP.    7. CKD: Stage 3.  BMET today.   Followup in 3 months.   Loralie Champagne 07/18/2016

## 2016-07-18 NOTE — Patient Instructions (Addendum)
Change Hydralazine to 20 mg (2 tabs) Twice daily   Change Torsemide to 40 mg Twice daily every other day ALTERNATING with 40 mg in AM and 20 mg in PM every other day   Labs today  Labs in 10 days in [place]  Please wear compression hose every day, put them on as soon as you get up in the morning and remove before bed  Your physician recommends that you schedule a follow-up appointment in: 3 months

## 2016-07-27 DIAGNOSIS — G4733 Obstructive sleep apnea (adult) (pediatric): Secondary | ICD-10-CM | POA: Diagnosis not present

## 2016-07-27 DIAGNOSIS — I5022 Chronic systolic (congestive) heart failure: Secondary | ICD-10-CM | POA: Diagnosis not present

## 2016-07-27 DIAGNOSIS — M545 Low back pain: Secondary | ICD-10-CM | POA: Diagnosis not present

## 2016-07-27 DIAGNOSIS — G4737 Central sleep apnea in conditions classified elsewhere: Secondary | ICD-10-CM | POA: Diagnosis not present

## 2016-07-27 DIAGNOSIS — R5383 Other fatigue: Secondary | ICD-10-CM | POA: Diagnosis not present

## 2016-07-29 ENCOUNTER — Encounter: Payer: Self-pay | Admitting: Cardiology

## 2016-07-29 DIAGNOSIS — I5022 Chronic systolic (congestive) heart failure: Secondary | ICD-10-CM | POA: Diagnosis not present

## 2016-07-30 DIAGNOSIS — G4733 Obstructive sleep apnea (adult) (pediatric): Secondary | ICD-10-CM | POA: Diagnosis not present

## 2016-08-03 DIAGNOSIS — I482 Chronic atrial fibrillation: Secondary | ICD-10-CM | POA: Diagnosis not present

## 2016-08-10 ENCOUNTER — Telehealth: Payer: Self-pay | Admitting: Gastroenterology

## 2016-08-10 ENCOUNTER — Other Ambulatory Visit (HOSPITAL_COMMUNITY): Payer: Self-pay | Admitting: Cardiology

## 2016-08-10 DIAGNOSIS — I482 Chronic atrial fibrillation: Secondary | ICD-10-CM | POA: Diagnosis not present

## 2016-08-10 NOTE — Telephone Encounter (Signed)
PT said he started Metamucil and [employer] after the last visit with Roseanne Kaufman, NP. He said he has since had A LOT of gas and feels sick on his stomach after taking the pills. He held the probiotic this am and felt a little better. He is aware that Vicente Males is not in the office today but will be back in tomorrow. Please advise!

## 2016-08-10 NOTE — Telephone Encounter (Signed)
[phone] OR [phone]  PATIENT CALLED AND HAS A QUESTION ABOUT MEDICATION.  WHEN HE WAKES UP HE HAS A LOT OF GAS AND IS STILL NOT GOING TO THE BATHROOM

## 2016-08-12 NOTE — Telephone Encounter (Signed)
Called and informed pt. He said he is feeling better since not taking the probiotic. He will call back next week with report.

## 2016-08-12 NOTE — Telephone Encounter (Signed)
Continue to hold probiotic and see how he does. Progress report next week.

## 2016-08-16 ENCOUNTER — Telehealth (HOSPITAL_COMMUNITY): Payer: Self-pay | Admitting: *Deleted

## 2016-08-16 DIAGNOSIS — I5022 Chronic systolic (congestive) heart failure: Secondary | ICD-10-CM

## 2016-08-16 NOTE — Telephone Encounter (Signed)
-----   Message from Larey Dresser, MD sent at 08/11/2016  3:55 PM EST ----- Creatinine a bit higher but not markedly.  Follow low K diet.  Would get repeat BMET early next week.

## 2016-08-16 NOTE — Telephone Encounter (Signed)
Notes Recorded by Harvie Junior, [place] on 08/16/2016 at 2:33 PM EST Patient aware and added to lab schedule 1/3 ------  Notes Recorded by Larey Dresser, MD on 08/11/2016 at 3:55 PM EST Creatinine a bit higher but not markedly. Follow low K diet. Would get repeat BMET early next week. ------  Notes Recorded by Scarlette Calico, RN on 08/10/2016 at 4:55 PM EST Labs were drawn 12/15 but just sent to [place] today, CR and K elevated, will send to Dr Aundra Dubin to address

## 2016-08-16 NOTE — Telephone Encounter (Signed)
Noted  

## 2016-08-17 ENCOUNTER — Ambulatory Visit (HOSPITAL_COMMUNITY)
Admission: RE | Admit: 2016-08-17 | Discharge: 2016-08-17 | Disposition: A | Discharge status: Home / Self Care | Payer: Medicare Other | Source: Ambulatory Visit | Attending: Cardiology | Admitting: Cardiology

## 2016-08-17 DIAGNOSIS — I5022 Chronic systolic (congestive) heart failure: Secondary | ICD-10-CM | POA: Insufficient documentation

## 2016-08-17 LAB — BASIC METABOLIC PANEL
Anion gap: 10 (ref 5–15)
BUN: 62 mg/dL — AB (ref 6–20)
CALCIUM: 9.3 mg/dL (ref 8.9–10.3)
CO2: 25 mmol/L (ref 22–32)
CREATININE: 2.09 mg/dL — AB (ref 0.61–1.24)
Chloride: 102 mmol/L (ref 101–111)
GFR calc Af Amer: 32 mL/min — ABNORMAL LOW (ref >60)
GFR, EST NON AFRICAN AMERICAN: 27 mL/min — AB (ref >60)
GLUCOSE: 182 mg/dL — AB (ref 65–99)
Potassium: 5.5 mmol/L — ABNORMAL HIGH (ref 3.5–5.1)
Sodium: 137 mmol/L (ref 135–145)

## 2016-08-18 ENCOUNTER — Telehealth (HOSPITAL_COMMUNITY): Payer: Self-pay | Admitting: *Deleted

## 2016-08-18 MED ORDER — TORSEMIDE 20 MG PO TABS: ORAL_TABLET | ORAL | 3 refills | Status: AC

## 2016-08-18 NOTE — Telephone Encounter (Signed)
Notes Recorded by Kennieth Rad, RN on 08/18/2016 at 4:41 PM EST Patient called back and he agrees with the medication changes and requested to have lab work done at his PCP office in [place] next Thursday. Will fax prescription for BMET. ------  Notes Recorded by Harvie Junior, [place] on 08/18/2016 at 3:39 PM EST Called patient to go over med changes and lab results the call was disconnected called patient back multiple times and no answer. Will try patient again tomorrow ------  Notes Recorded by Larey Dresser, MD on 08/18/2016 at 11:31 AM EST Stop spironolactone. Hold torsemide completely x 1 day then decrease to 40 mg daily alternating with 40 qam/20 qpm. BMET in 1 week. Call today.          Medication changes have been made and prescription faxed for BMET to PCP at [phone].  Requested for them to call/fax [place] the results.

## 2016-08-22 ENCOUNTER — Telehealth: Payer: Self-pay | Admitting: *Deleted

## 2016-08-22 DIAGNOSIS — R404 Transient alteration of awareness: Secondary | ICD-10-CM | POA: Diagnosis not present

## 2016-09-15 DIAGNOSIS — 419620001 Death: Secondary | SNOMED CT | POA: Diagnosis not present

## 2016-09-15 NOTE — Telephone Encounter (Signed)
I called patient about the clinical study updates related to the detached docking buttons that have been reported in the [employer] pacemaker.There have been no serious injuries related to the docking buttons. I will send patient a written letter explaining the updates to them. We will continue to follow patient every 6 months. Patient verbalized understanding of clinical updates.

## 2016-09-15 DEATH — deceased

## 2016-10-11 ENCOUNTER — Ambulatory Visit: Payer: Medicare Other | Admitting: Gastroenterology

## 2016-10-25 ENCOUNTER — Ambulatory Visit: Payer: Medicare Other | Admitting: Cardiology
